# Patient Record
Sex: Male | Born: 1950 | Race: White | Hispanic: No | Marital: Single | Smoking: Former smoker
Health system: Southern US, Community
[De-identification: ages and names within clinical notes are randomized; demographics above are authoritative.]

## PROBLEM LIST (undated history)

## (undated) DIAGNOSIS — N189 Chronic kidney disease, unspecified: Secondary | ICD-10-CM

## (undated) DIAGNOSIS — F4000 Agoraphobia, unspecified: Secondary | ICD-10-CM

## (undated) DIAGNOSIS — M1990 Unspecified osteoarthritis, unspecified site: Secondary | ICD-10-CM

## (undated) DIAGNOSIS — J40 Bronchitis, not specified as acute or chronic: Secondary | ICD-10-CM

## (undated) DIAGNOSIS — G709 Myoneural disorder, unspecified: Secondary | ICD-10-CM

## (undated) DIAGNOSIS — D649 Anemia, unspecified: Secondary | ICD-10-CM

## (undated) DIAGNOSIS — K219 Gastro-esophageal reflux disease without esophagitis: Secondary | ICD-10-CM

## (undated) DIAGNOSIS — R7989 Other specified abnormal findings of blood chemistry: Secondary | ICD-10-CM

## (undated) DIAGNOSIS — L719 Rosacea, unspecified: Secondary | ICD-10-CM

## (undated) DIAGNOSIS — Z5189 Encounter for other specified aftercare: Secondary | ICD-10-CM

## (undated) DIAGNOSIS — T4145XA Adverse effect of unspecified anesthetic, initial encounter: Secondary | ICD-10-CM

## (undated) DIAGNOSIS — K2270 Barrett's esophagus without dysplasia: Secondary | ICD-10-CM

## (undated) DIAGNOSIS — F419 Anxiety disorder, unspecified: Secondary | ICD-10-CM

## (undated) DIAGNOSIS — F39 Unspecified mood [affective] disorder: Secondary | ICD-10-CM

## (undated) DIAGNOSIS — K5900 Constipation, unspecified: Secondary | ICD-10-CM

## (undated) DIAGNOSIS — E538 Deficiency of other specified B group vitamins: Secondary | ICD-10-CM

## (undated) DIAGNOSIS — M542 Cervicalgia: Secondary | ICD-10-CM

## (undated) DIAGNOSIS — K222 Esophageal obstruction: Secondary | ICD-10-CM

## (undated) DIAGNOSIS — J45909 Unspecified asthma, uncomplicated: Secondary | ICD-10-CM

## (undated) DIAGNOSIS — I10 Essential (primary) hypertension: Secondary | ICD-10-CM

## (undated) DIAGNOSIS — T8859XA Other complications of anesthesia, initial encounter: Secondary | ICD-10-CM

## (undated) DIAGNOSIS — G8929 Other chronic pain: Secondary | ICD-10-CM

## (undated) HISTORY — DX: Unspecified mood (affective) disorder: F39

## (undated) HISTORY — DX: Deficiency of other specified B group vitamins: E53.8

## (undated) HISTORY — DX: Essential (primary) hypertension: I10

## (undated) HISTORY — DX: Chronic kidney disease, unspecified: N18.9

## (undated) HISTORY — DX: Esophageal obstruction: K22.2

## (undated) HISTORY — DX: Other specified abnormal findings of blood chemistry: R79.89

## (undated) HISTORY — DX: Rosacea, unspecified: L71.9

## (undated) HISTORY — PX: CHEST TUBE INSERTION: SHX231

## (undated) HISTORY — PX: FRACTURE SURGERY: SHX138

## (undated) HISTORY — PX: WISDOM TOOTH EXTRACTION: SHX21

## (undated) HISTORY — PX: OTHER SURGICAL HISTORY: SHX169

## (undated) HISTORY — DX: Constipation, unspecified: K59.00

## (undated) HISTORY — DX: Barrett's esophagus without dysplasia: K22.70

## (undated) HISTORY — DX: Anxiety disorder, unspecified: F41.9

## (undated) HISTORY — DX: Agoraphobia, unspecified: F40.00

## (undated) HISTORY — DX: Myoneural disorder, unspecified: G70.9

## (undated) HISTORY — DX: Gastro-esophageal reflux disease without esophagitis: K21.9

## (undated) HISTORY — DX: Encounter for other specified aftercare: Z51.89

---

## 1999-03-01 ENCOUNTER — Encounter (INDEPENDENT_AMBULATORY_CARE_PROVIDER_SITE_OTHER): Payer: Self-pay | Admitting: *Deleted

## 1999-03-01 ENCOUNTER — Encounter: Admission: RE | Admit: 1999-03-01 | Discharge: 1999-03-01 | Payer: Self-pay | Admitting: *Deleted

## 2002-09-01 ENCOUNTER — Emergency Department (HOSPITAL_COMMUNITY): Admission: EM | Admit: 2002-09-01 | Discharge: 2002-09-01 | Payer: Self-pay | Admitting: Emergency Medicine

## 2003-08-20 ENCOUNTER — Encounter (INDEPENDENT_AMBULATORY_CARE_PROVIDER_SITE_OTHER): Payer: Self-pay | Admitting: *Deleted

## 2003-08-20 ENCOUNTER — Ambulatory Visit (HOSPITAL_COMMUNITY): Admission: RE | Admit: 2003-08-20 | Discharge: 2003-08-20 | Payer: Self-pay | Admitting: Internal Medicine

## 2003-08-20 ENCOUNTER — Encounter: Payer: Self-pay | Admitting: Internal Medicine

## 2003-08-20 HISTORY — PX: COLONOSCOPY: SHX174

## 2004-04-01 ENCOUNTER — Ambulatory Visit: Payer: Self-pay | Admitting: Family Medicine

## 2004-07-21 ENCOUNTER — Ambulatory Visit: Payer: Self-pay | Admitting: Family Medicine

## 2004-11-01 ENCOUNTER — Ambulatory Visit: Payer: Self-pay | Admitting: Internal Medicine

## 2004-11-22 ENCOUNTER — Ambulatory Visit: Payer: Self-pay | Admitting: Family Medicine

## 2005-03-17 ENCOUNTER — Ambulatory Visit: Payer: Self-pay | Admitting: Family Medicine

## 2005-07-14 ENCOUNTER — Emergency Department (HOSPITAL_COMMUNITY): Admission: EM | Admit: 2005-07-14 | Discharge: 2005-07-14 | Payer: Self-pay | Admitting: Emergency Medicine

## 2005-07-18 ENCOUNTER — Ambulatory Visit: Payer: Self-pay | Admitting: Family Medicine

## 2005-11-29 ENCOUNTER — Ambulatory Visit: Payer: Self-pay | Admitting: Family Medicine

## 2006-03-30 ENCOUNTER — Ambulatory Visit: Payer: Self-pay | Admitting: Family Medicine

## 2006-05-11 ENCOUNTER — Ambulatory Visit: Payer: Self-pay | Admitting: Family Medicine

## 2006-08-09 ENCOUNTER — Ambulatory Visit: Payer: Self-pay | Admitting: Internal Medicine

## 2006-08-10 ENCOUNTER — Ambulatory Visit: Payer: Self-pay | Admitting: Internal Medicine

## 2006-08-10 ENCOUNTER — Encounter: Payer: Self-pay | Admitting: Internal Medicine

## 2006-08-17 ENCOUNTER — Ambulatory Visit: Payer: Self-pay | Admitting: Family Medicine

## 2007-05-22 ENCOUNTER — Emergency Department (HOSPITAL_COMMUNITY): Admission: EM | Admit: 2007-05-22 | Discharge: 2007-05-22 | Payer: Self-pay | Admitting: Emergency Medicine

## 2007-05-26 ENCOUNTER — Emergency Department (HOSPITAL_COMMUNITY): Admission: EM | Admit: 2007-05-26 | Discharge: 2007-05-26 | Payer: Self-pay | Admitting: Emergency Medicine

## 2007-08-15 ENCOUNTER — Ambulatory Visit (HOSPITAL_COMMUNITY): Admission: RE | Admit: 2007-08-15 | Discharge: 2007-08-15 | Payer: Self-pay | Admitting: Family Medicine

## 2007-08-23 ENCOUNTER — Ambulatory Visit (HOSPITAL_COMMUNITY): Admission: RE | Admit: 2007-08-23 | Discharge: 2007-08-23 | Payer: Self-pay | Admitting: Family Medicine

## 2007-10-16 ENCOUNTER — Ambulatory Visit (HOSPITAL_COMMUNITY): Admission: RE | Admit: 2007-10-16 | Discharge: 2007-10-16 | Payer: Self-pay | Admitting: Orthopaedic Surgery

## 2008-01-08 ENCOUNTER — Encounter: Payer: Self-pay | Admitting: Internal Medicine

## 2008-04-01 ENCOUNTER — Encounter: Payer: Self-pay | Admitting: Internal Medicine

## 2008-07-25 ENCOUNTER — Encounter: Payer: Self-pay | Admitting: Internal Medicine

## 2008-08-05 ENCOUNTER — Telehealth: Payer: Self-pay | Admitting: Internal Medicine

## 2008-09-15 ENCOUNTER — Encounter: Payer: Self-pay | Admitting: Internal Medicine

## 2008-10-17 DIAGNOSIS — F411 Generalized anxiety disorder: Secondary | ICD-10-CM | POA: Insufficient documentation

## 2008-10-17 DIAGNOSIS — K5900 Constipation, unspecified: Secondary | ICD-10-CM | POA: Insufficient documentation

## 2008-10-17 DIAGNOSIS — K222 Esophageal obstruction: Secondary | ICD-10-CM | POA: Insufficient documentation

## 2008-10-17 DIAGNOSIS — I10 Essential (primary) hypertension: Secondary | ICD-10-CM | POA: Insufficient documentation

## 2008-10-17 DIAGNOSIS — K2270 Barrett's esophagus without dysplasia: Secondary | ICD-10-CM | POA: Insufficient documentation

## 2008-10-17 DIAGNOSIS — K219 Gastro-esophageal reflux disease without esophagitis: Secondary | ICD-10-CM | POA: Insufficient documentation

## 2008-10-22 ENCOUNTER — Ambulatory Visit: Payer: Self-pay | Admitting: Internal Medicine

## 2008-10-23 LAB — CONVERTED CEMR LAB
ALT: 29 units/L (ref 0–53)
AST: 27 units/L (ref 0–37)
Albumin: 4.3 g/dL (ref 3.5–5.2)
Alkaline Phosphatase: 110 units/L (ref 39–117)
Bilirubin, Direct: 0.1 mg/dL (ref 0.0–0.3)
Total Bilirubin: 0.9 mg/dL (ref 0.3–1.2)
Total Protein: 7.7 g/dL (ref 6.0–8.3)

## 2008-10-31 ENCOUNTER — Ambulatory Visit: Payer: Self-pay | Admitting: Internal Medicine

## 2008-10-31 ENCOUNTER — Encounter: Payer: Self-pay | Admitting: Internal Medicine

## 2008-10-31 ENCOUNTER — Ambulatory Visit (HOSPITAL_COMMUNITY): Admission: RE | Admit: 2008-10-31 | Discharge: 2008-10-31 | Payer: Self-pay | Admitting: Internal Medicine

## 2008-11-03 ENCOUNTER — Encounter: Payer: Self-pay | Admitting: Internal Medicine

## 2008-11-10 ENCOUNTER — Telehealth: Payer: Self-pay | Admitting: Internal Medicine

## 2009-05-15 ENCOUNTER — Encounter: Payer: Self-pay | Admitting: Internal Medicine

## 2010-04-19 ENCOUNTER — Telehealth: Payer: Self-pay | Admitting: Internal Medicine

## 2010-04-27 NOTE — Procedures (Signed)
Summary: EGD   EGD  Procedure date:  08/10/2006  Findings:      Location: [facility]   Patient Name: [name], [name]. MRN:  Procedure Procedures: Panendoscopy (EGD) CPT: 43235.    with biopsy(s)/brushing(s). CPT: D1846139.  Personnel: Endoscopist: Zacharias Ridling L. Juanda Chance, MD.  Exam Location: Exam performed in Outpatient Clinic. Outpatient  Patient Consent: Procedure, Alternatives, Risks and Benefits discussed, consent obtained, from patient. Consent was obtained by the RN.  Indications Symptoms: Reflux symptoms  Surveillance of: Barrett's Esophagus. Last exam: May, 2005.  History  Current Medications: Patient is not currently taking Coumadin.  Pre-Exam Physical: Performed Aug 10, 2006  Entire physical exam was normal.  Comments: Pt. history reviewed/updated, physical exam performed prior to initiation of sedation?yes Exam Exam Info: Maximum depth of insertion Duodenum, intended Duodenum. Vocal cords visualized. Gastric retroflexion performed. Images taken. ASA Classification: I. Tolerance: good.  Sedation Meds: Patient assessed and found to be appropriate for moderate (conscious) sedation. Fentanyl 50 mcg. given IV. Versed 5 mg. given IV. Cetacaine Spray 2 sprays given aerosolized.  Monitoring: BP and pulse monitoring done. Oximetry used. Supplemental O2 given  Findings - Normal: Fundus.  - BARRETT'S ESOPHAGUS:  established. Length of Barrett's 1 cm. Inflammation present. A retroflex image was taken. Biopsy/Barrett's taken. ICD9: Barrett's: 530.2. Comment: mild esophagitis.  Normal: Duodenal Apex.   Assessment Abnormal examination, see findings above.  Diagnoses: 530.2: Barrett's.   Comments: Barrett's esophagus, with mild esophagitis Events  Unplanned Intervention: No unplanned interventions were required.  Unplanned Events: There were no complications. Plans Medication(s): Await pathology. PPI: 1 BID, starting Aug 10, 2006   Comments:  repeat colonoscopy in 2 years Disposition: After procedure patient sent to recovery. After recovery patient sent home.    FINAL DIAGNOSIS    ***MICROSCOPIC EXAMINATION AND DIAGNOSIS***    ESOPHAGUS, BIOPSY: REFLUX ESOPHAGITIS WITH FOCAL EROSION. SEE   COMMENT.    COMMENT   Sections reveal benign squamous and gastric-type mucosa showing   inflammation with reactive changes and focal features of erosion.   Although marked reactive changes are seen in the area of   erosion, definite evidence of dysplasia is not identified and no   malignancy is seen. Although the intestinal metaplasia   identified on previous biopsy ([id number]) is not identified in   this specimen, this difference may be due to sampling variation   and clinical correlation is suggested. An Alcian Blue stain is   performed to determine the presence of intestinal metaplasia   (goblet cell metaplasia). No intestinal metaplasia (goblet cell   metaplasia) is identified with the Alcian Blue stain. The control   stained appropriately. (RC:gt, 08/14/06)    gdt   Date Reported: 08/14/2006 Clearance Coots, MD   *** Electronically Signed Out By [facility] ***    Clinical information   R/O dysplasia. History of Barrett' s. (mj)    specimen(s) obtained   Esophagus, biopsy    Gross Description   Received in formalin are tan, soft tissue fragments that are   submitted in toto. Number: six   Size: 0.1 to 0.2 cm, one block. (TB:gt, 08/11/06)    gdt/   Additional Information  HL7 RESULT STATUS : 2006-08-11:14:27:00.000000  This report was created from the original endoscopy report, which was reviewed and signed by the above listed endoscopist.

## 2010-04-27 NOTE — Letter (Signed)
Summary: Patient Notice-Barrett's [facility]  [address] [place], [place] [address]   Phone: [phone]  Fax: [phone]        November 03, 2008 MRN: [id number]    Matthew [name] [address] [place], [place]  [address]    Dear [name],  I am pleased to inform you that the biopsies taken during your recent endoscopic examination did not show any evidence of cancer upon pathologic examination.  However, your biopsies indicate you have a condition known as Barrett's esophagus. While not cancer, it is pre-cancerous (can progress to cancer) and needs to be monitored with repeat endoscopic examination and biopsies.  Fortunately, it is quite rare that this develops into cancer, but careful monitoring of the condition along with taking your medication as prescribed is important in reducing the risk of developing cancer.  It is my recommendation that you have a repeat upper gastrointestinal endoscopic examination in 2_ years.  Additional information/recommendations:  __Please call [phone] to schedule a return visit to further      evaluate your condition.  __Continue with treatment plan as outlined the day of your exam.  Please call us if you have or develop heartburn, reflux symptoms, any swallowing problems, or if you have questions about your condition that have not been fully answered at this time.  Sincerely,  Hart Carwin MD  This letter has been electronically signed by your physician.  Appended Document: Patient Notice-Barrett's Esopghagus Letter mailed to patient. Recall is in IDX for 10/2010.

## 2010-04-27 NOTE — Procedures (Signed)
Summary: EGD/BX.   EGD  Procedure date:  10/31/2008  Findings:      Location: [facility]   ENDOSCOPY PROCEDURE REPORT  PATIENT:  Matthew [name], Matthew [name]  MR#:  [id number] BIRTHDATE:   05/13/1950, 58 yrs. old   GENDER:   male  ENDOSCOPIST:   Hedwig Morton. Juanda Chance, MD Referred by: Joette Catching, M.D.  PROCEDURE DATE:  10/31/2008 PROCEDURE:  EGD with biopsy ASA CLASS:   Class I INDICATIONS: h/o Barrett's Esophagus last EGD 2008, c/w Barretts esophagus, Sx's controlled with Nexiem  MEDICATIONS:    Versed 7.5 mg, Fentanyl 75 mcg TOPICAL ANESTHETIC:   Cetacaine Spray  DESCRIPTION OF PROCEDURE:   After the risks benefits and alternatives of the procedure were thoroughly explained, informed consent was obtained.  The [id number] ([id number]) endoscope was introduced through the mouth and advanced to the second portion of the duodenum, without limitations.  The instrument was slowly withdrawn as the mucosa was fully examined. <<PROCEDUREIMAGES>>        <<OLD IMAGES>>  Barrett's esophagus was found. short segment Barrett's esophagus, no stricture, no acute esophagitis With standard forceps, a biopsy was obtained and sent to pathology (see image001).  Retained food was present in the total stomach (see image002 and image005).  Otherwise the examination was normal (see image004).    Retroflexed views revealed no abnormalities.    The scope was then withdrawn from the patient and the procedure completed.  COMPLICATIONS:   None  ENDOSCOPIC IMPRESSION:  1) Barrett's esophagus  2) Food, retained in the total stomach  3) Otherwise normal examination  s/p biopsies RECOMMENDATIONS:  1) anti-reflux regimen  2) await biopsy results  Nexiem 40 mg po qd  REPEAT EXAM:   In 2 year(s) for.   _______________________________ Hedwig Morton. Juanda Chance, MD    CC:     Appended Document: EGD/BX. Recall is in IDX for 10/2010.

## 2010-04-27 NOTE — Miscellaneous (Signed)
Summary: prevacid refill  Clinical Lists Changes  Medications: Changed medication from PREVACID 30 MG CPDR (LANSOPRAZOLE) Take 1 capsule by mouth every morning to PREVACID 30 MG CPDR (LANSOPRAZOLE) Take 1 tablet by mouth two times a day. NEED OFFICE VISIT FOR FURTHER REFILLS! - Signed Rx of PREVACID 30 MG CPDR (LANSOPRAZOLE) Take 1 tablet by mouth two times a day. NEED OFFICE VISIT FOR FURTHER REFILLS!;  #60 x 5;  Signed;  Entered by: Hortense Ramal CMA;  Authorized by: Hart Carwin MD;  Method used: Print then Give to Patient    Prescriptions: PREVACID 30 MG CPDR (LANSOPRAZOLE) Take 1 tablet by mouth two times a day. NEED OFFICE VISIT FOR FURTHER REFILLS!  #60 x 5   Entered by:   Hortense Ramal CMA   Authorized by:   Hart Carwin MD   Signed by:   Hortense Ramal CMA on 01/08/2008   Method used:   Print then Give to Patient   RxID:   [id number]

## 2010-04-27 NOTE — Progress Notes (Signed)
Summary: TRIAGE--? MEDS  Phone Note Call from Patient Call back at Home Phone [phone]   Caller: Patient Call For: Tiffay Pinette Reason for Call: Talk to Nurse Details for Reason: ? meds  Summary of Call: pt is concerned about side effects on Nexium . Initial call taken by: Guadlupe [facility],  Aug 05, 2008 11:46 AM  Follow-up for Phone Call        Pt. saw a news show last night and is concerned about taking Nexium long term. Pt. takes Nexium two times a day, "I been taking  it for a long time." States the news show said he should be worried about kidney disease, intestional infectional and pneumonia. States his creatinin was checked in February by Dr.Dunham, "She said I would be fine"  Pt. has no c/o.  DR.Dhara Schepp PLEASE ADVISE  Follow-up by: Laureen Ochs LPN,  Aug 05, 2008 12:01 PM  Additional Follow-up for Phone Call Additional follow up Details #1::        OK to take Nexiem long term because he needs it  to control his acid. Additional Follow-up by: Hart Carwin,  Aug 05, 2008 10:45 PM    Additional Follow-up for Phone Call Additional follow up Details #2::    Above MD orders reviewed with patient. Pt. instructed to call back as needed.   Follow-up by: Laureen Ochs LPN,  Aug 06, 2008 8:30 AM

## 2010-04-27 NOTE — Miscellaneous (Signed)
Summary: Nexium Rx  Clinical Lists Changes  Medications: Rx of NEXIUM 40 MG CPDR (ESOMEPRAZOLE MAGNESIUM) Take 1 tablet by mouth two times a day. MUST HAVE OFFICE VISIT FOR FURTHER REFILLS!;  #30 x 0;  Signed;  Entered by: Hortense Ramal CMA;  Authorized by: Hart Carwin;  Method used: Faxed to [facility], [phone] [address] [place], [place], [place]  [address], Ph: [phone] or [phone], Fax: [phone]    Prescriptions: NEXIUM 40 MG CPDR (ESOMEPRAZOLE MAGNESIUM) Take 1 tablet by mouth two times a day. MUST HAVE OFFICE VISIT FOR FURTHER REFILLS!  #30 x 0   Entered by:   Hortense Ramal CMA   Authorized by:   Hart Carwin   Signed by:   Hortense Ramal CMA on 09/15/2008   Method used:   Faxed to ...       Hospital doctor (retail)       [address]       [place], [place]  [address]       Ph: [phone] or [phone]       Fax: [phone]   RxID:   [id number]

## 2010-04-27 NOTE — Miscellaneous (Signed)
Summary: rx change to nexium  Patient requested a change to nexium due to his insurance formulary. Hortense Ramal CMA  April 01, 2008 8:59 AM    Clinical Lists Changes  Medications: Changed medication from PREVACID 30 MG CPDR (LANSOPRAZOLE) Take 1 tablet by mouth two times a day. NEED OFFICE VISIT FOR FURTHER REFILLS! to NEXIUM 40 MG CPDR (ESOMEPRAZOLE MAGNESIUM) Take 1 tablet by mouth two times a day. (pharmacy-please d/c prevacid rx) - Signed Rx of NEXIUM 40 MG CPDR (ESOMEPRAZOLE MAGNESIUM) Take 1 tablet by mouth two times a day. (pharmacy-please d/c prevacid rx);  #60 x 3;  Signed;  Entered by: Hortense Ramal CMA;  Authorized by: Hart Carwin MD;  Method used: Printed then faxed to [facility], [phone] [address], [place], [place], [place]  [address], Ph: [phone] or [phone], Fax: [phone]    Prescriptions: NEXIUM 40 MG CPDR (ESOMEPRAZOLE MAGNESIUM) Take 1 tablet by mouth two times a day. (pharmacy-please d/c prevacid rx)  #60 x 3   Entered by:   Hortense Ramal CMA   Authorized by:   Hart Carwin MD   Signed by:   Hortense Ramal CMA on 04/01/2008   Method used:   Printed then faxed to ...       Hospital doctor (retail)       [address]       [place], [place]  [address]       Ph: [phone] or [phone]       Fax: [phone]   RxID:   [id number]

## 2010-04-27 NOTE — Miscellaneous (Signed)
Summary: Nexium Rx  Clinical Lists Changes  Medications: Changed medication from NEXIUM 40 MG CPDR (ESOMEPRAZOLE MAGNESIUM) Take 1 tablet by mouth two times a day. (pharmacy-please d/c prevacid rx) to NEXIUM 40 MG CPDR (ESOMEPRAZOLE MAGNESIUM) Take 1 tablet by mouth two times a day. MUST HAVE OFFICE VISIT FOR FURTHER REFILLS! - Signed Rx of NEXIUM 40 MG CPDR (ESOMEPRAZOLE MAGNESIUM) Take 1 tablet by mouth two times a day. MUST HAVE OFFICE VISIT FOR FURTHER REFILLS!;  #60 x 1;  Signed;  Entered by: Hortense Ramal CMA;  Authorized by: Hart Carwin;  Method used: Faxed to [facility], [phone] [address] [place], [place], [place]  [address], Ph: [phone] or [phone], Fax: [phone]    Prescriptions: NEXIUM 40 MG CPDR (ESOMEPRAZOLE MAGNESIUM) Take 1 tablet by mouth two times a day. MUST HAVE OFFICE VISIT FOR FURTHER REFILLS!  #60 x 1   Entered by:   Hortense Ramal CMA   Authorized by:   Hart Carwin   Signed by:   Hortense Ramal CMA on 07/25/2008   Method used:   Faxed to ...       Hospital doctor (retail)       [address]       [place], [place]  [address]       Ph: [phone] or [phone]       Fax: [phone]   RxID:   [id number]

## 2010-04-27 NOTE — Procedures (Signed)
Summary: Gastroenterology-[facility]  Gastroenterology-[facility]   Imported By: Lowry Ram NCMA 10/17/2008 17:31:59  _____________________________________________________________________  External Attachment:    Type:   Image     Comment:   External Document

## 2010-04-27 NOTE — Miscellaneous (Signed)
Summary: Nexium Refills  Clinical Lists Changes  Medications: Rx of NEXIUM 40 MG CPDR (ESOMEPRAZOLE MAGNESIUM) Take 1 tablet by mouth two times a day.;  #60 x 5;  Signed;  Entered by: Hortense Ramal CMA (AAMA);  Authorized by: Hart Carwin MD;  Method used: Faxed to [facility], [phone] [address] [place], [place], [place]  [address], Ph: [phone] or [phone], Fax: [phone]    Prescriptions: NEXIUM 40 MG CPDR (ESOMEPRAZOLE MAGNESIUM) Take 1 tablet by mouth two times a day.  #60 x 5   Entered by:   Hortense Ramal CMA (AAMA)   Authorized by:   Hart Carwin MD   Signed by:   Hortense Ramal CMA (AAMA) on 05/15/2009   Method used:   Faxed to ...       Hospital doctor (retail)       [address]       [place], [place]  [address]       Ph: [phone] or [phone]       Fax: [phone]   RxID:   [id number]

## 2010-04-27 NOTE — Progress Notes (Signed)
Summary: Nexium  Phone Note Call from Patient   Call For: DR Cerria Randhawa Reason for Call: Talk to Nurse Summary of Call: Is he still supposed to be on Nexium 40mg ? Initial call taken by: Leanor Kail [facility],  November 10, 2008 10:46 AM  Follow-up for Phone Call        Yes he is to continue on it most likely indefinately. He has Barrett's esophagus. Patient was advised of this and it was explained to him that we want him to continue the medication to suppress the acid from refluxing back into his esophagus. We dont want any further progression of the Barrett's. Patient verbalizes understanding. Follow-up by: Hortense Ramal CMA Duncan Dull),  November 10, 2008 10:54 AM

## 2010-04-27 NOTE — Assessment & Plan Note (Signed)
Summary: F-UP NEXIUM/FH   History of Present Illness Visit Type: follow up Primary GI MD: Lina Sar MD Primary Provider: Joette Catching, MD Requesting Provider: n/a Chief Complaint: Pt needs Nexium refilled. Pt denies any GI complaints  History of Present Illness:   Mr. Matthew [name] is a 60 year old gentleman with a history of functional constipation and esophageal stricture for which he is status post esophageal dilatation in 1996 and again in 2005. On the 2005 endoscopy, esophageal biopsies confirmed the presence of Barrett's esophagus. The most recent endoscopy in 2008 showed  reflux esophagitis with focal erosion. but no Barrett'. However, no intestinal metaplasia was seen on those biopsies. His recall colonoscopy was due in May 2015. He had a routine colonoscopy done in May 2005, which was normal. There were no polyps. He is due for another colonoscopy in 2015. Patient comes today for a routine visit and for refills on his Nexium. He has no gastrointestinal complaints at this time.    GI Review of Systems      Denies abdominal pain, acid reflux, belching, bloating, chest pain, dysphagia with liquids, dysphagia with solids, heartburn, loss of appetite, nausea, vomiting, vomiting blood, weight loss, and  weight gain.        Denies anal fissure, black tarry stools, change in bowel habit, constipation, diarrhea, diverticulosis, fecal incontinence, heme positive stool, hemorrhoids, irritable bowel syndrome, jaundice, light color stool, liver problems, rectal bleeding, and  rectal pain.    Current Medications (verified): 1)  Nexium 40 Mg Cpdr (Esomeprazole Magnesium) .... Take 1 Tablet By Mouth Two Times A Day. Must Have Office Visit For Further Refills! 2)  Tegretol Xr 100 Mg Xr12h-Tab (Carbamazepine) .... One Tablet By Mouth Four Times A Day 3)  Norvasc 10 Mg Tabs (Amlodipine Besylate) .... One Tablet By Mouth Once Daily 4)  Ativan 1 Mg Tabs (Lorazepam) .... One Tablet By Mouth Four Times A Day  5)  Diovan 80 Mg Tabs (Valsartan) .... One Tablet By Mouth Once Daily 6)  Aspirin 81 Mg  Tabs (Aspirin) .... One Tablet By Mouth Once Daily 7)  Trihexyphenidyl Hcl 2 Mg Tabs (Trihexyphenidyl Hcl) .... One Tablet Two Times A Day 8)  Chlorpromazine Hcl 100 Mg Tabs (Chlorpromazine Hcl) .... One Tablet By Mouth Four Times A Day  Allergies (verified): 1)  ! Pcn 2)  Tylenol (Acetaminophen) 3)  Amoxicillin (Amoxicillin)  Past History:  Past Medical History: Reviewed history from 10/17/2008 and no changes required. Current Problems:  ANXIETY (ICD-300.00) HYPERTENSION (ICD-401.9) Hx of CONSTIPATION (ICD-564.00) GERD (ICD-530.81) BARRETTS ESOPHAGUS (ICD-530.85) Hx of ESOPHAGEAL STRICTURE (ICD-530.3)    Past Surgical History: Reviewed history from 10/17/2008 and no changes required. unremarkable  Family History: Reviewed history from 10/17/2008 and no changes required. No FH of Colon Cancer:  Social History: Reviewed history from 10/17/2008 and no changes required. Occupation: Disabled Alcohol Use - no Illicit Drug Use - no  Review of Systems  The patient denies allergy/sinus, anemia, anxiety-new, arthritis/joint pain, back pain, blood in urine, breast changes/lumps, change in vision, confusion, cough, coughing up blood, depression-new, fainting, fatigue, fever, headaches-new, hearing problems, heart murmur, heart rhythm changes, itching, muscle pains/cramps, night sweats, nosebleeds, shortness of breath, skin rash, sleeping problems, sore throat, swelling of feet/legs, swollen lymph glands, thirst - excessive, urination - excessive, urination changes/pain, urine leakage, vision changes, and voice change.         Pertinent positive and negative review of systems were noted in the above HPI. All other ROS was otherwise negative.   Vital Signs:  Patient profile:   60 year old male Height:      76 inches Weight:      202 pounds BMI:     24.68 BSA:     2.23 Pulse rate:   88 /  minute Pulse rhythm:   regular BP sitting:   132 / 76  (left arm) Cuff size:   regular  Vitals Entered By: Ok Anis CMA (October 22, 2008 11:23 AM)  Physical Exam  General:  alert, oriented and in no distress. He speaks somewhat slowly. Eyes:  PERRLA, no icterus. Mouth:  No deformity or lesions, dentition normal. Lungs:  Clear throughout to auscultation. Heart:  Regular rate and rhythm; no murmurs, rubs,  or bruits. Abdomen:  Soft, nontender and nondistended. No masses, hepatosplenomegaly or hernias noted. Normal bowel sounds. Psych:  flat affect, alert and oriented.   Impression & Recommendations:  Problem # 1:  Hx of CONSTIPATION (ICD-564.00) Patient takes Benefiber p.r.n. His last colonoscopy was in 2005. His recall colonoscopy will be due in 2015.  Problem # 2:  BARRETTS ESOPHAGUS (ICD-530.85) Barrett's was confirmed on  biopsies from 2005. There was no Barrett's seen on an endoscopy  in 2008. He is due for a repeat endoscopy and repeat biopsies. We will also refill his Nexium 40 mg twice a day. Orders: TLB-Hepatic/Liver Function Pnl ([id number]-HEPATIC) ZEGD (ZEGD)  Patient Instructions: 1)  Schedule Endoscopy with biopsies 2)  LFT's to be drawn today. 3)  Refills for Nexium. 4)  Recall colonoscopy 07/2013. 5)  Copy sent to : Joette Catching, MD Prescriptions: NEXIUM 40 MG CPDR (ESOMEPRAZOLE MAGNESIUM) Take 1 tablet by mouth two times a day.  #60 x 5   Entered by:   Hortense Ramal CMA (AAMA)   Authorized by:   Hart Carwin MD   Signed by:   Hortense Ramal CMA (AAMA) on 10/22/2008   Method used:   Faxed to ...       Hospital doctor (retail)       [address] [place] [address]       [place], [place]  [address]       Ph: [phone] or [phone]       Fax: [phone]   RxID:   [id number]

## 2010-04-27 NOTE — Procedures (Signed)
Summary: Instructions for procedure/[facility] (out pt)  Instructions for procedure/[facility] (out pt)   Imported By: Sherian Rein 10/23/2008 11:24:28  _____________________________________________________________________  External Attachment:    Type:   Image     Comment:   External Document

## 2010-04-29 NOTE — Progress Notes (Signed)
Summary: Medication  Phone Note Call from Patient Call back at Home Phone [phone]   Caller: Patient Call For: Dr. Juanda Chance Reason for Call: Talk to Nurse Summary of Call: [name] he was returning your call....Marland KitchenMarland KitchenMedco will not longer cover his Nexium...wants to know if Dr. Juanda Chance can send a letter Initial call taken by: Karna Christmas,  April 19, 2010 8:35 AM  Follow-up for Phone Call        Patient advised that we will need a rejected claim (he is not due for more Nexium until 05/08/10). Patient advised that he should request refill when it gets closer to that time and if there is a coverage problem, they will let us know and we can get prior authorization when it is due. Patient verbalizes understanding. Follow-up by: Lamona Curl CMA (AAMA),  April 19, 2010 9:10 AM

## 2010-05-31 ENCOUNTER — Encounter: Payer: Self-pay | Admitting: Internal Medicine

## 2010-06-08 NOTE — Medication Information (Signed)
Summary: Prior autho & approved for Nexium/[employer]  Prior autho & approved for Nexium/[employer]   Imported By: Sherian Rein 06/03/2010 11:05:04  _____________________________________________________________________  External Attachment:    Type:   Image     Comment:   External Document

## 2010-06-28 ENCOUNTER — Other Ambulatory Visit: Payer: Self-pay | Admitting: *Deleted

## 2010-06-28 MED ORDER — ESOMEPRAZOLE MAGNESIUM 40 MG PO CPDR
DELAYED_RELEASE_CAPSULE | ORAL | Status: DC
Start: 2010-06-28 — End: 2010-08-31

## 2010-06-28 NOTE — Telephone Encounter (Signed)
We have received a fax from [facility] for refills on Nexium for patient. We will give 1 month supply with 1 refill but patient needs office visit. It has been almost 1 years since we last saw him and he has Barrett's Esophagus.

## 2010-08-05 ENCOUNTER — Other Ambulatory Visit (HOSPITAL_COMMUNITY): Payer: Self-pay | Admitting: Nephrology

## 2010-08-05 DIAGNOSIS — D369 Benign neoplasm, unspecified site: Secondary | ICD-10-CM

## 2010-08-10 NOTE — Assessment & Plan Note (Signed)
[facility]                         GASTROENTEROLOGY OFFICE NOTE   MAXDEN, NAJI                       MRN:          [id number]  DATE:08/09/2006                            DOB:          1950/08/02    Mr. Licciardi is a 60 year old gentleman with a history of functional  constipation, esophageal stricture status post esophageal dilatation in  1996 and most recently in May of 2005.  On the last endoscopy,  esophageal biopsies confirmed presence of Barrett's esophagus.  He was  due for an endoscopy last year and was supposed to get a recall later,  but after reviewing his chart, I do not see where he was ever notified  to come back for repeat endoscopy.  He had a routine colonoscopy May  2005, which was normal.  There were no polyps, and he is due for another  1 in 10 years.  He called our office several weeks ago complaining of  constipation.  We have started him on MiraLax 17 g daily.  He has been  happy with this preparation having bowel movements on a daily basis.  He  denies having diarrhea.  Prior to [facility], he was on Benefiber 2  teaspoons 3 times a day.   PHYSICAL EXAM:  Blood pressure 122/72, pulse 68, and weight 208 pounds.  The patient was alert and oriented.  He was not examined.   IMPRESSION:  86. A 60 -year-old gentleman with functional constipation.  He is up to      date on his colonoscopy.  2. Barrett's esophagus needs followup endoscopy and biopsies.   PLAN:  1. Continue MiraLax on a p.r.n. basis.  2. Resume Benefiber 2 teaspoons twice a day.  3. Upper endoscopy scheduled with possible plans for biopsies.  The      patient ought to continue on his proton pump inhibitor.  He is on      Prevacid 30 mg daily.     Hedwig Morton. Juanda Chance, MD     DMB/MedQ  DD: 08/09/2006  DT: 08/09/2006  Job #: [id number]   cc:   Delaney Meigs, M.D.

## 2010-08-12 ENCOUNTER — Ambulatory Visit (HOSPITAL_COMMUNITY): Payer: Self-pay

## 2010-08-12 ENCOUNTER — Encounter (HOSPITAL_COMMUNITY)
Admission: RE | Admit: 2010-08-12 | Discharge: 2010-08-12 | Disposition: A | Discharge status: Home / Self Care | Payer: Medicare Other | Source: Ambulatory Visit | Attending: Nephrology | Admitting: Nephrology

## 2010-08-12 DIAGNOSIS — E213 Hyperparathyroidism, unspecified: Secondary | ICD-10-CM | POA: Insufficient documentation

## 2010-08-12 DIAGNOSIS — D369 Benign neoplasm, unspecified site: Secondary | ICD-10-CM

## 2010-08-12 MED ORDER — TECHNETIUM TC 99M SESTAMIBI - CARDIOLITE
25.0000 | Freq: Once | INTRAVENOUS | Status: AC | PRN
Start: 2010-08-12 — End: 2010-08-12
  Administered 2010-08-12: 25 via INTRAVENOUS

## 2010-08-27 ENCOUNTER — Other Ambulatory Visit: Payer: Self-pay | Admitting: *Deleted

## 2010-08-27 NOTE — Telephone Encounter (Signed)
Rx denied. Patient needs office visit. He has Barrett's and last endoscopy was in 2010. We asked that an office visit be scheduled at his last refill and there is currently no office visit scheduled.

## 2010-08-31 ENCOUNTER — Other Ambulatory Visit: Payer: Self-pay | Admitting: Internal Medicine

## 2010-08-31 MED ORDER — ESOMEPRAZOLE MAGNESIUM 40 MG PO CPDR: DELAYED_RELEASE_CAPSULE | ORAL | Status: DC

## 2010-08-31 NOTE — Telephone Encounter (Signed)
Patient must keep appointment for further refills.

## 2010-10-05 ENCOUNTER — Ambulatory Visit (INDEPENDENT_AMBULATORY_CARE_PROVIDER_SITE_OTHER): Payer: Medicare Other | Admitting: Internal Medicine

## 2010-10-05 ENCOUNTER — Encounter: Payer: Self-pay | Admitting: Internal Medicine

## 2010-10-05 VITALS — BP 110/62 | HR 80 | Ht 76.0 in | Wt 193.4 lb

## 2010-10-05 DIAGNOSIS — K2270 Barrett's esophagus without dysplasia: Secondary | ICD-10-CM

## 2010-10-05 MED ORDER — ESOMEPRAZOLE MAGNESIUM 40 MG PO CPDR: 40.0000 mg | DELAYED_RELEASE_CAPSULE | Freq: Two times a day (BID) | ORAL | Status: DC

## 2010-10-05 NOTE — Progress Notes (Signed)
Matthew [name] 01-10-1951 MRN [id number]     History of Present Illness:  This is a 60 year old white male with Barrett's esophagus confirmed in 1996 and in 2005. His last upper endoscopy in August 2008 showed intestinal metaplasia and goblet cells consistent with Barrett's esophagus. His symptoms are well-controlled on Nexium 40 mg by mouth twice a day. She has tried to reduce her Nexium to one a day but had breakthrough symptoms. He is due for a repeat upper endoscopy. Additional medical problems include chronic renal insufficiency, B12 deficiency and high blood pressure. His last colonoscopy in May 2005 was normal. A recall colonoscopy will be due in May 2015.  Past Medical History  Diagnosis Date  . Anxiety   . Hypertension   . GERD (gastroesophageal reflux disease)   . Barrett esophagus   . Esophageal stricture   . Vitamin B12 deficiency   . Mood disorder   . Agoraphobia    History reviewed. No pertinent past surgical history.  reports that he has quit smoking. He does not have any smokeless tobacco history on file. He reports that he does not drink alcohol or use illicit drugs. family history includes Diabetes in his brother.  There is no history of Colon cancer. Allergies  Allergen Reactions  . Acetaminophen     REACTION: unspecified  . Amoxicillin     REACTION: unspecified  . Penicillins         Review of Systems:  The remainder of the 10  point ROS is negative except as outlined in H&P   Physical Exam: General appearance  Well developed, in no distress. Eyes- non icteric. HEENT nontraumatic, normocephalic. Mouth no lesions, tongue papillated, no cheilosis. Neck supple without adenopathy, thyroid not enlarged, no carotid bruits, no JVD. Lungs Clear to auscultation bilaterally. Cor normal S1 normal S2, regular rhythm , no murmur,  quiet precordium. Abdomen nontender, normoactive bowel sounds. Rectal: Not done. Extremities no pedal edema. Skin no  lesions. Neurological alert and oriented x 3. Psychological normal mood and affect.  Assessment and Plan:  Problem #1 Barrett's esophagus. Asymptomatic. Patient is due for a repeat upper endoscopy in August 2012. We will schedule that today. We will refill his Nexium 40 mg by mouth twice a day, He is to continue antireflux measures. Problem #2  colorectal screening. Patient is due for a repeat colonoscopy in May 2015.      10/05/2010 Lina Sar

## 2010-10-05 NOTE — Patient Instructions (Addendum)
You have been scheduled for an endoscopy. Please follow written instructions given to you at your visit today. We have sent the following medications to your pharmacy for you to pick up at your convenience: Nexium 40 mg. Take 1 tablet by mouth twice daily CC: Dr Lysbeth Galas

## 2010-10-14 ENCOUNTER — Telehealth: Payer: Self-pay | Admitting: Internal Medicine

## 2010-10-14 NOTE — Telephone Encounter (Signed)
Patient calling to report constipation. States he feels full and has no appetite. He took one dose of Miralax last night and had a small bowel movement that was "hard balls". Patient will repeat a Miralax dose this AM and increase his fluid intake. He will call back if not successful with Miralax.

## 2010-11-04 ENCOUNTER — Telehealth: Payer: Self-pay | Admitting: Internal Medicine

## 2010-11-04 NOTE — Telephone Encounter (Signed)
Patient calling because his green sheet said not to take laxatives after the procedure but to let the body return to normal. He is confused about this since he is having an EGD. Spoke with patient and told him he could take the Miralax without any problem. He is asking about taking Benefiber. He will discuss this with Dr. Juanda Chance on Tues when he has his procedure.

## 2010-11-08 ENCOUNTER — Telehealth: Payer: Self-pay | Admitting: Internal Medicine

## 2010-11-08 NOTE — Telephone Encounter (Signed)
Yes, charge late canc fee, he knew about his mother having surgery , I am sure, for a while. Please take him out of the schedule so we can use his spot. Thanx DB

## 2010-11-09 ENCOUNTER — Other Ambulatory Visit: Payer: Medicare Other | Admitting: Internal Medicine

## 2010-12-17 LAB — URINALYSIS, ROUTINE W REFLEX MICROSCOPIC
Bilirubin Urine: NEGATIVE
Glucose, UA: NEGATIVE
Hgb urine dipstick: NEGATIVE
Ketones, ur: NEGATIVE
Nitrite: NEGATIVE
Protein, ur: NEGATIVE
Specific Gravity, Urine: 1.007
Urobilinogen, UA: 0.2
pH: 5.5

## 2010-12-17 LAB — I-STAT 8, (EC8 V) (CONVERTED LAB)
Acid-base deficit: 4 — ABNORMAL HIGH
BUN: 19
Bicarbonate: 20.9
Chloride: 103
Glucose, Bld: 128 — ABNORMAL HIGH
HCT: 36 — ABNORMAL LOW
Hemoglobin: 12.2 — ABNORMAL LOW
Operator id: 277751
Potassium: 3.9
Sodium: 134 — ABNORMAL LOW
TCO2: 22
pCO2, Ven: 37.2 — ABNORMAL LOW
pH, Ven: 7.357 — ABNORMAL HIGH

## 2010-12-17 LAB — RAPID URINE DRUG SCREEN, HOSP PERFORMED
Amphetamines: NOT DETECTED
Barbiturates: NOT DETECTED
Benzodiazepines: POSITIVE — AB
Cocaine: NOT DETECTED
Opiates: NOT DETECTED
Tetrahydrocannabinol: NOT DETECTED

## 2010-12-17 LAB — CBC
HCT: 33.7 — ABNORMAL LOW
Hemoglobin: 11.9 — ABNORMAL LOW
MCHC: 35.3
MCV: 85.8
Platelets: 137 — ABNORMAL LOW
RBC: 3.92 — ABNORMAL LOW
RDW: 12.9
WBC: 6.2

## 2010-12-17 LAB — POCT I-STAT CREATININE
Creatinine, Ser: 2.2 — ABNORMAL HIGH
Operator id: 277751

## 2010-12-17 LAB — CARBAMAZEPINE LEVEL, TOTAL: Carbamazepine Lvl: 8.7

## 2010-12-21 ENCOUNTER — Encounter: Payer: Self-pay | Admitting: Internal Medicine

## 2010-12-21 ENCOUNTER — Ambulatory Visit (AMBULATORY_SURGERY_CENTER): Payer: Medicare Other | Admitting: Internal Medicine

## 2010-12-21 VITALS — BP 150/83 | HR 68 | Temp 97.9°F | Resp 14 | Ht 76.0 in | Wt 193.4 lb

## 2010-12-21 DIAGNOSIS — K2270 Barrett's esophagus without dysplasia: Secondary | ICD-10-CM

## 2010-12-21 MED ORDER — SODIUM CHLORIDE 0.9 % IV SOLN: 500.0000 mL | INTRAVENOUS | Status: DC

## 2010-12-21 NOTE — Patient Instructions (Signed)
Discharge instructions given with verbal understanding.  Handouts on Barretts and a soft diet given.  Resume previous medications.

## 2010-12-22 ENCOUNTER — Telehealth: Payer: Self-pay | Admitting: *Deleted

## 2010-12-22 NOTE — Telephone Encounter (Signed)

## 2010-12-28 ENCOUNTER — Encounter: Payer: Self-pay | Admitting: Internal Medicine

## 2011-01-23 ENCOUNTER — Ambulatory Visit (HOSPITAL_COMMUNITY)
Admission: RE | Admit: 2011-01-23 | Discharge: 2011-01-23 | Disposition: A | Discharge status: Home / Self Care | Payer: Medicare Other | Source: Ambulatory Visit | Attending: Family Medicine | Admitting: Family Medicine

## 2011-01-23 ENCOUNTER — Other Ambulatory Visit (HOSPITAL_COMMUNITY): Payer: Self-pay | Admitting: Family Medicine

## 2011-01-23 DIAGNOSIS — M51379 Other intervertebral disc degeneration, lumbosacral region without mention of lumbar back pain or lower extremity pain: Secondary | ICD-10-CM | POA: Insufficient documentation

## 2011-01-23 DIAGNOSIS — M549 Dorsalgia, unspecified: Secondary | ICD-10-CM

## 2011-01-23 DIAGNOSIS — M545 Low back pain, unspecified: Secondary | ICD-10-CM | POA: Insufficient documentation

## 2011-01-23 DIAGNOSIS — M542 Cervicalgia: Secondary | ICD-10-CM

## 2011-01-23 DIAGNOSIS — M5137 Other intervertebral disc degeneration, lumbosacral region: Secondary | ICD-10-CM | POA: Insufficient documentation

## 2011-01-23 DIAGNOSIS — M5030 Other cervical disc degeneration, unspecified cervical region: Secondary | ICD-10-CM | POA: Insufficient documentation

## 2011-05-26 ENCOUNTER — Other Ambulatory Visit (HOSPITAL_COMMUNITY): Payer: Self-pay | Admitting: Family Medicine

## 2011-05-26 DIAGNOSIS — M545 Low back pain, unspecified: Secondary | ICD-10-CM

## 2011-05-31 ENCOUNTER — Ambulatory Visit (HOSPITAL_COMMUNITY)
Admission: RE | Admit: 2011-05-31 | Discharge: 2011-05-31 | Disposition: A | Discharge status: Home / Self Care | Payer: Medicare Other | Source: Ambulatory Visit | Attending: Family Medicine | Admitting: Family Medicine

## 2011-05-31 ENCOUNTER — Ambulatory Visit (HOSPITAL_COMMUNITY): Payer: Medicare Other

## 2011-05-31 DIAGNOSIS — M545 Low back pain, unspecified: Secondary | ICD-10-CM | POA: Insufficient documentation

## 2011-05-31 DIAGNOSIS — R209 Unspecified disturbances of skin sensation: Secondary | ICD-10-CM | POA: Insufficient documentation

## 2011-05-31 DIAGNOSIS — M79609 Pain in unspecified limb: Secondary | ICD-10-CM | POA: Insufficient documentation

## 2011-05-31 DIAGNOSIS — M51379 Other intervertebral disc degeneration, lumbosacral region without mention of lumbar back pain or lower extremity pain: Secondary | ICD-10-CM | POA: Insufficient documentation

## 2011-05-31 DIAGNOSIS — M25559 Pain in unspecified hip: Secondary | ICD-10-CM | POA: Insufficient documentation

## 2011-05-31 DIAGNOSIS — M5137 Other intervertebral disc degeneration, lumbosacral region: Secondary | ICD-10-CM | POA: Insufficient documentation

## 2011-05-31 DIAGNOSIS — M5126 Other intervertebral disc displacement, lumbar region: Secondary | ICD-10-CM | POA: Insufficient documentation

## 2011-10-24 ENCOUNTER — Other Ambulatory Visit: Payer: Self-pay | Admitting: Internal Medicine

## 2011-11-09 ENCOUNTER — Encounter (HOSPITAL_COMMUNITY): Payer: Self-pay | Admitting: *Deleted

## 2011-11-09 ENCOUNTER — Emergency Department (HOSPITAL_COMMUNITY)
Admission: EM | Admit: 2011-11-09 | Discharge: 2011-11-09 | Disposition: A | Discharge status: Home / Self Care | Payer: Medicare Other | Attending: Emergency Medicine | Admitting: Emergency Medicine

## 2011-11-09 DIAGNOSIS — Z7982 Long term (current) use of aspirin: Secondary | ICD-10-CM | POA: Insufficient documentation

## 2011-11-09 DIAGNOSIS — Z87891 Personal history of nicotine dependence: Secondary | ICD-10-CM | POA: Insufficient documentation

## 2011-11-09 DIAGNOSIS — M5432 Sciatica, left side: Secondary | ICD-10-CM

## 2011-11-09 DIAGNOSIS — K2270 Barrett's esophagus without dysplasia: Secondary | ICD-10-CM | POA: Insufficient documentation

## 2011-11-09 DIAGNOSIS — Z880 Allergy status to penicillin: Secondary | ICD-10-CM | POA: Insufficient documentation

## 2011-11-09 DIAGNOSIS — M5430 Sciatica, unspecified side: Secondary | ICD-10-CM | POA: Insufficient documentation

## 2011-11-09 DIAGNOSIS — Z881 Allergy status to other antibiotic agents status: Secondary | ICD-10-CM | POA: Insufficient documentation

## 2011-11-09 DIAGNOSIS — Z79899 Other long term (current) drug therapy: Secondary | ICD-10-CM | POA: Insufficient documentation

## 2011-11-09 DIAGNOSIS — I10 Essential (primary) hypertension: Secondary | ICD-10-CM | POA: Insufficient documentation

## 2011-11-09 DIAGNOSIS — F4000 Agoraphobia, unspecified: Secondary | ICD-10-CM | POA: Insufficient documentation

## 2011-11-09 DIAGNOSIS — Z886 Allergy status to analgesic agent status: Secondary | ICD-10-CM | POA: Insufficient documentation

## 2011-11-09 DIAGNOSIS — K219 Gastro-esophageal reflux disease without esophagitis: Secondary | ICD-10-CM | POA: Insufficient documentation

## 2011-11-09 MED ORDER — OXYCODONE HCL 5 MG PO TABS: 5.0000 mg | ORAL_TABLET | ORAL | Status: DC | PRN

## 2011-11-09 MED ORDER — HYDROMORPHONE HCL PF 1 MG/ML IJ SOLN
1.0000 mg | Freq: Once | INTRAMUSCULAR | Status: AC
  Administered 2011-11-09: 1 mg via INTRAMUSCULAR
  Filled 2011-11-09: qty 1

## 2011-11-09 NOTE — ED Notes (Signed)
Pain rt hip and back . No injury.

## 2011-11-09 NOTE — ED Notes (Signed)
Waiting on pt's post injection obs time to complete for discharge.

## 2011-11-14 NOTE — ED Provider Notes (Signed)
History     CSN: [id number]  Arrival date & time 11/09/11  1827   First MD Initiated Contact with Patient 11/09/11 1854      Chief Complaint  Patient presents with  . Back Pain    (Consider location/radiation/quality/duration/timing/severity/associated sxs/prior treatment) HPI Comments: Matthew [name] presents with acute on chronic low back pain which has which has been present for the past several days.   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  There is radiation of pain into his right hip and buttock.  There has been no weakness or numbness in the lower extremities and no urinary or bowel retention or incontinence.  Patient does not have a history of cancer or IVDU.  He has been referred to Dr. Phoebe [name],  Whom he will be seeing next month for further evaluation and determination if he will require surgery for his back pain.  He has tried rest and heat with no relief of his pain.  The history is provided by the patient.    Past Medical History  Diagnosis Date  . Anxiety   . Hypertension   . GERD (gastroesophageal reflux disease)   . Barrett esophagus   . Esophageal stricture   . Vitamin B12 deficiency   . Mood disorder   . Agoraphobia   . Elevated serum creatinine     Past Surgical History  Procedure Date  . Upper gastrointestinal endoscopy   . Chest tube insertion     age 61    Family History  Problem Relation Age of Onset  . Colon cancer Neg Hx   . Diabetes Brother     History  Substance Use Topics  . Smoking status: Former Games developer  . Smokeless tobacco: Not on file  . Alcohol Use: No      Review of Systems  Constitutional: Negative for fever.  Respiratory: Negative for shortness of breath.   Cardiovascular: Negative for chest pain and leg swelling.  Gastrointestinal: Negative for abdominal pain, constipation and abdominal distention.  Genitourinary: Negative for dysuria, urgency, frequency,  flank pain and difficulty urinating.  Musculoskeletal: Positive for back pain. Negative for joint swelling and gait problem.  Skin: Negative for rash.  Neurological: Negative for weakness and numbness.    Allergies  Acetaminophen; Amoxicillin; Erythromycin; and Penicillins  Home Medications   Current Outpatient Rx  Name Route Sig Dispense Refill  . AMLODIPINE BESYLATE 10 MG PO TABS Oral Take 10 mg by mouth daily.      . ASPIRIN 81 MG PO TABS Oral Take 81 mg by mouth daily.      Marland Kitchen CARBAMAZEPINE 100 MG PO CHEW Oral Chew 100 mg by mouth 4 (four) times daily.      . CHLORPROMAZINE HCL 100 MG PO TABS Oral Take 100 mg by mouth 4 (four) times daily.      . CYANOCOBALAMIN 1000 MCG/ML IJ SOLN Intramuscular Inject 1,000 mcg into the muscle every 30 (thirty) days.      Marland Kitchen DOXYCYCLINE HYCLATE 100 MG PO TABS      . LORAZEPAM 1 MG PO TABS Oral Take 1 mg by mouth 4 (four) times daily.      Marland Kitchen NEXIUM 40 MG PO CPDR  TAKE  (1)  CAPSULE  TWICE DAILY. 60 each 4  . OXYCODONE HCL 5 MG PO TABS Oral Take 1 tablet (5 mg total) by mouth every 4 (four) hours as needed for pain. 20 tablet 0  . TRIHEXYPHENIDYL  HCL 2 MG PO TABS Oral Take 2 mg by mouth 2 (two) times daily with a meal.      . VALSARTAN 80 MG PO TABS Oral Take 80 mg by mouth daily.        BP 160/81  Pulse 81  Temp 98.4 F (36.9 C) (Oral)  Ht 6\' 4"  (1.93 m)  Wt 190 lb (86.183 kg)  BMI 23.13 kg/m2  SpO2 100%  Physical Exam  Nursing note and vitals reviewed. Constitutional: He appears well-developed and well-nourished.  HENT:  Head: Normocephalic.  Eyes: Conjunctivae are normal.  Neck: Normal range of motion. Neck supple.  Cardiovascular: Normal rate and intact distal pulses.        Pedal pulses normal.  Pulmonary/Chest: Effort normal.  Abdominal: Soft. Bowel sounds are normal. He exhibits no distension and no mass.  Musculoskeletal: Normal range of motion. He exhibits no edema.       Lumbar back: He exhibits tenderness. He exhibits no bony  tenderness, no swelling, no edema and no spasm.       Right paralumbar ttp,  Right SI joint ttp.  Neurological: He is alert. He has normal strength. He displays no atrophy and no tremor. No sensory deficit. Gait normal.  Reflex Scores:      Patellar reflexes are 2+ on the right side and 2+ on the left side.      Achilles reflexes are 2+ on the right side and 2+ on the left side.      No strength deficit noted in hip and knee flexor and extensor muscle groups.  Ankle flexion and extension intact.  Skin: Skin is warm and dry.  Psychiatric: He has a normal mood and affect.    ED Course  Procedures (including critical care time)  Labs Reviewed - No data to display No results found.   1. Left sided sciatica     Dilaudid 1 mg IM injection given with moderate relief of sx.  MDM  Oxycodone prescribed.  Heat,  Avoid lifting,  Bending.  No neuro deficit on exam or by history to suggest emergent or surgical presentation.  Also discussed worsened sx that should prompt immediate re-evaluation including distal weakness, bowel/bladder retention/incontinence.  Pt was advised that further chronic pain medications need to be obtained by his pcp or neurosurgeon.  Pt understands plan.  He was reviewed in state database prior to dc home.              Burgess Amor, [place] 11/14/11 2143

## 2011-11-15 ENCOUNTER — Other Ambulatory Visit (HOSPITAL_COMMUNITY): Payer: Self-pay | Admitting: Adult Health Nurse Practitioner

## 2011-11-15 ENCOUNTER — Ambulatory Visit (HOSPITAL_COMMUNITY)
Admission: RE | Admit: 2011-11-15 | Discharge: 2011-11-15 | Disposition: A | Discharge status: Home / Self Care | Payer: Medicare Other | Source: Ambulatory Visit | Attending: Adult Health Nurse Practitioner | Admitting: Adult Health Nurse Practitioner

## 2011-11-15 DIAGNOSIS — R52 Pain, unspecified: Secondary | ICD-10-CM

## 2011-11-15 DIAGNOSIS — M25559 Pain in unspecified hip: Secondary | ICD-10-CM | POA: Insufficient documentation

## 2011-11-17 NOTE — ED Provider Notes (Signed)
Medical screening examination/treatment/procedure(s) were performed by non-physician practitioner and as supervising physician I was immediately available for consultation/collaboration. Devoria Albe, MD, FACEP   Maxcy Givens, MD 11/17/11 [phone]

## 2011-11-19 ENCOUNTER — Encounter (HOSPITAL_COMMUNITY): Payer: Self-pay

## 2011-11-19 ENCOUNTER — Emergency Department (HOSPITAL_COMMUNITY)
Admission: EM | Admit: 2011-11-19 | Discharge: 2011-11-19 | Disposition: A | Discharge status: Home / Self Care | Payer: Medicare Other | Attending: Emergency Medicine | Admitting: Emergency Medicine

## 2011-11-19 DIAGNOSIS — M79609 Pain in unspecified limb: Secondary | ICD-10-CM | POA: Insufficient documentation

## 2011-11-19 DIAGNOSIS — I10 Essential (primary) hypertension: Secondary | ICD-10-CM | POA: Insufficient documentation

## 2011-11-19 DIAGNOSIS — Z79899 Other long term (current) drug therapy: Secondary | ICD-10-CM | POA: Insufficient documentation

## 2011-11-19 DIAGNOSIS — M5430 Sciatica, unspecified side: Secondary | ICD-10-CM

## 2011-11-19 DIAGNOSIS — M545 Low back pain, unspecified: Secondary | ICD-10-CM | POA: Insufficient documentation

## 2011-11-19 LAB — URINALYSIS, ROUTINE W REFLEX MICROSCOPIC
Bilirubin Urine: NEGATIVE
Glucose, UA: NEGATIVE mg/dL
Hgb urine dipstick: NEGATIVE
Ketones, ur: NEGATIVE mg/dL
Leukocytes, UA: NEGATIVE
Nitrite: NEGATIVE
Protein, ur: NEGATIVE mg/dL
Specific Gravity, Urine: 1.01 (ref 1.005–1.030)
Urobilinogen, UA: 0.2 mg/dL (ref 0.0–1.0)
pH: 6 (ref 5.0–8.0)

## 2011-11-19 MED ORDER — OXYCODONE HCL 5 MG PO TABS: 5.0000 mg | ORAL_TABLET | ORAL | Status: AC | PRN

## 2011-11-19 MED ORDER — KETOROLAC TROMETHAMINE 60 MG/2ML IM SOLN
60.0000 mg | Freq: Once | INTRAMUSCULAR | Status: DC
  Filled 2011-11-19: qty 2

## 2011-11-19 MED ORDER — METHYLPREDNISOLONE 4 MG PO KIT: PACK | ORAL | Status: DC

## 2011-11-19 MED ORDER — OXYCODONE HCL 5 MG PO TABS
10.0000 mg | ORAL_TABLET | Freq: Once | ORAL | Status: AC
  Administered 2011-11-19: 10 mg via ORAL
  Filled 2011-11-19: qty 2

## 2011-11-19 NOTE — ED Provider Notes (Signed)
History   This chart was scribed for Glynn Octave, MD scribed by Matthew [name]. The patient was seen in room APA12/APA12 at 17:46   CSN: [id number]  Arrival date & time 11/19/11  1729   First MD Initiated Contact with Patient 11/19/11 1742      Chief Complaint  Patient presents with  . Back Pain    (Consider location/radiation/quality/duration/timing/severity/associated sxs/prior treatment) Patient is a 61 y.o. male presenting with back pain. The history is provided by the patient. No language interpreter was used.  Back Pain    Matthew [name] is a 61 y.o. male who presents to the Emergency Department complaining of worsening moderate lower left back pain that radiates into his left leg, onset 6 days. Patient was seen here at ED 5 days ago for the same sxs. Says he was given and has taken one 10 mg oxycodone  every six hour with it last being taken 3 hours ago.  Denies fevers, vomiting, use of iv drugs, bladder/ bowel incontinence, abd pain, cough,and chest pain. Mother says he has been behaving normally, slurring speech is baseline, and denies he's been  exhibiting confusion. Pt denies falls,injuries, smoking, or use of alcohol, and reports he has hx of kidney problems.   Past Medical History  Diagnosis Date  . Anxiety   . Hypertension   . GERD (gastroesophageal reflux disease)   . Barrett esophagus   . Esophageal stricture   . Vitamin B12 deficiency   . Mood disorder   . Agoraphobia   . Elevated serum creatinine     Past Surgical History  Procedure Date  . Upper gastrointestinal endoscopy   . Chest tube insertion     age 89    Family History  Problem Relation Age of Onset  . Colon cancer Neg Hx   . Diabetes Brother     History  Substance Use Topics  . Smoking status: Former Games developer  . Smokeless tobacco: Not on file  . Alcohol Use: No      Review of Systems 10 Systems reviewed and are negative for acute change except as noted in the HPI. Allergies    Acetaminophen; Amoxicillin; Erythromycin; and Penicillins  Home Medications   Current Outpatient Rx  Name Route Sig Dispense Refill  . AMLODIPINE BESYLATE 10 MG PO TABS Oral Take 10 mg by mouth daily.      . ASPIRIN EC 81 MG PO TBEC Oral Take 81 mg by mouth daily.    Marland Kitchen CARBAMAZEPINE 200 MG PO TABS Oral Take 100 mg by mouth 4 (four) times daily.    . CHLORPROMAZINE HCL 100 MG PO TABS Oral Take 100 mg by mouth 4 (four) times daily.      Marland Kitchen LORAZEPAM 1 MG PO TABS Oral Take 1 mg by mouth 4 (four) times daily.      Marland Kitchen NEXIUM 40 MG PO CPDR  TAKE  (1)  CAPSULE  TWICE DAILY. 60 each 4  . OXYCODONE HCL 10 MG PO TABS Oral Take 10 mg by mouth every 6 (six) hours as needed. pain    . TRIHEXYPHENIDYL HCL 2 MG PO TABS Oral Take 2 mg by mouth 2 (two) times daily with a meal.      . VALSARTAN 80 MG PO TABS Oral Take 80 mg by mouth daily.      . CYANOCOBALAMIN 1000 MCG/ML IJ SOLN Intramuscular Inject 1,000 mcg into the muscle every 30 (thirty) days.        BP 157/85  Pulse  70  Temp 97.5 F (36.4 C) (Oral)  Resp 18  Ht 6\' 4"  (1.93 m)  Wt 190 lb (86.183 kg)  BMI 23.13 kg/m2  SpO2 100%  Physical Exam  Nursing note and vitals reviewed. Constitutional: He is oriented to person, place, and time. He appears well-developed and well-nourished. No distress.       Slurred speech which patient relative states is baseline  HENT:  Head: Normocephalic and atraumatic.  Eyes: Conjunctivae and EOM are normal.  Neck: Neck supple. No tracheal deviation present.  Cardiovascular: Normal rate.   Pulmonary/Chest: Effort normal. No respiratory distress.  Abdominal: Soft. He exhibits no distension and no mass.  Genitourinary:       Nml rectal tone  Musculoskeletal: Normal range of motion.       +2 patellar reflexes bilaterally. Ankle dorsiflexion intact. Ankle pt pulses intact bilaterally. Great toe dorsiflexion intact bilaterally. Left postive straight leg raise  Neurological: He is alert and oriented to person,  place, and time. No sensory deficit.  Skin: Skin is dry. He is not diaphoretic.  Psychiatric: He has a normal mood and affect. His behavior is normal.    ED Course  Procedures (including critical care time) DIAGNOSTIC STUDIES: Oxygen Saturation is 100% on room air, normal by my interpretation.    COORDINATION OF CARE: 19:20 Performs recheck. Patient says he is having mild pain at hip, but notes otherwise pain has  Improved.  Labs Reviewed  URINALYSIS, ROUTINE W REFLEX MICROSCOPIC - Abnormal; Notable for the following:    Color, Urine STRAW (*)     All other components within normal limits   No results found.   No diagnosis found.    MDM  Left-sided low back pain since August 18 radiating down left leg.  Seen 5 days ago for same. No weakness, numbness, tingling, bowel or bladder incontinence, fevers or vomiting.  Normal rectal tone, postvoid residual 45 mL. Urinalysis negative.  No neurological deficits or red flags on exam. Followup with Dr. Phoebe Perch as scheduled. Return precautions discussed including incontinence, weakness, worsening pain.  I personally performed the services described in this documentation, which was scribed in my presence.  The recorded information has been reviewed and considered.         Glynn Octave, MD 11/19/11 2042

## 2011-11-19 NOTE — ED Notes (Signed)
Family at bedside. Patient is comfortable at this time. 

## 2011-11-19 NOTE — ED Notes (Signed)
Pinched nerve since July 18th, has been seen by pmd, given oxycodone for pain and is not helping. Pt with rambling thoughts in triage.

## 2011-11-19 NOTE — ED Notes (Signed)
Pt c/o left side back pain that radiates down left side since July 18. Pt has been tx and rx pain meds without relief. Pain becomes worse with movement.

## 2011-11-22 ENCOUNTER — Emergency Department (HOSPITAL_COMMUNITY): Payer: Medicare Other

## 2011-11-22 ENCOUNTER — Encounter (HOSPITAL_COMMUNITY): Payer: Self-pay | Admitting: Emergency Medicine

## 2011-11-22 ENCOUNTER — Emergency Department (HOSPITAL_COMMUNITY)
Admission: EM | Admit: 2011-11-22 | Discharge: 2011-11-22 | Disposition: A | Discharge status: Home / Self Care | Payer: Medicare Other | Attending: Emergency Medicine | Admitting: Emergency Medicine

## 2011-11-22 DIAGNOSIS — F411 Generalized anxiety disorder: Secondary | ICD-10-CM | POA: Insufficient documentation

## 2011-11-22 DIAGNOSIS — I10 Essential (primary) hypertension: Secondary | ICD-10-CM | POA: Insufficient documentation

## 2011-11-22 DIAGNOSIS — Z7982 Long term (current) use of aspirin: Secondary | ICD-10-CM | POA: Insufficient documentation

## 2011-11-22 DIAGNOSIS — IMO0001 Reserved for inherently not codable concepts without codable children: Secondary | ICD-10-CM | POA: Insufficient documentation

## 2011-11-22 DIAGNOSIS — K219 Gastro-esophageal reflux disease without esophagitis: Secondary | ICD-10-CM | POA: Insufficient documentation

## 2011-11-22 DIAGNOSIS — Z79899 Other long term (current) drug therapy: Secondary | ICD-10-CM | POA: Insufficient documentation

## 2011-11-22 DIAGNOSIS — G8929 Other chronic pain: Secondary | ICD-10-CM | POA: Insufficient documentation

## 2011-11-22 DIAGNOSIS — M549 Dorsalgia, unspecified: Secondary | ICD-10-CM | POA: Insufficient documentation

## 2011-11-22 MED ORDER — HYDROMORPHONE HCL 2 MG PO TABS: 2.0000 mg | ORAL_TABLET | ORAL | Status: AC | PRN

## 2011-11-22 MED ORDER — SODIUM CHLORIDE 0.9 % IV SOLN
Freq: Once | INTRAVENOUS | Status: AC
  Administered 2011-11-22: 20 mL/h via INTRAVENOUS

## 2011-11-22 MED ORDER — ONDANSETRON HCL 4 MG/2ML IJ SOLN
4.0000 mg | Freq: Once | INTRAMUSCULAR | Status: AC
  Administered 2011-11-22: 4 mg via INTRAVENOUS
  Filled 2011-11-22: qty 2

## 2011-11-22 MED ORDER — HYDROMORPHONE HCL PF 1 MG/ML IJ SOLN
1.0000 mg | Freq: Once | INTRAMUSCULAR | Status: AC
  Administered 2011-11-22: 1 mg via INTRAVENOUS
  Filled 2011-11-22: qty 1

## 2011-11-22 MED ORDER — HYDROMORPHONE HCL PF 1 MG/ML IJ SOLN
1.0000 mg | Freq: Once | INTRAMUSCULAR | Status: AC
  Administered 2011-11-22: 1 mg via INTRAMUSCULAR
  Filled 2011-11-22: qty 1

## 2011-11-22 MED ORDER — HYDROMORPHONE HCL PF 1 MG/ML IJ SOLN: 1.0000 mg | Freq: Once | INTRAMUSCULAR | Status: DC

## 2011-11-22 NOTE — ED Notes (Signed)
Pt has history of back pain and sciatica pain. Pt states unable to walk today.

## 2011-11-22 NOTE — ED Provider Notes (Signed)
History  This chart was scribed for Matthew Lennert, MD by Erskine Emery. This patient was seen in room APAH6/APAH6 and the patient's care was started at 15:18.   CSN: [id number]  Arrival date & time 11/22/11  1455   First MD Initiated Contact with Patient 11/22/11 1518      Chief Complaint  Patient presents with  . Back Pain    (Consider location/radiation/quality/duration/timing/severity/associated sxs/prior Treatment) Matthew [name] is a 61 y.o. male brought in by ambulance, who presents to the Emergency Department complaining of back pain for the past several months. Pt reports the pain is located in his left buttocks, it does not radiate, and he cannot turn to lay on his right side as a result of a pinched nerve. Pt saw Dr. Phoebe Perch in [place] on July 15th for his back pain, was given a medication injection, and told to return in 4 weeks for an MRI if his pain continued. Pt still has not had the MRI. Pt also reports some kidney issues but denies any difficulty urinating. Pt was prescribed new medications on Saturday (3 days ago). Pt was given 10 mg of morphine via EMS PTA.  Pt reports he is allergic to Acetaminophen, amoxicillin, erythromycin, Nsaids, and penicillins.  Patient is a 61 y.o. male presenting with back pain. The history is provided by the patient. No language interpreter was used.  Back Pain  This is a chronic problem. The current episode started more than 1 week ago. The problem occurs constantly. The problem has not changed since onset.The pain is associated with no known injury. The pain is present in the gluteal region. The pain does not radiate. The pain is moderate. The symptoms are aggravated by twisting and certain positions. Pertinent negatives include no chest pain, no fever, no headaches, no abdominal pain, no bladder incontinence, no dysuria and no paresis.   Dr. Lysbeth Galas is the pt's PCP.     Past Medical History  Diagnosis Date  . Anxiety   . Hypertension     . GERD (gastroesophageal reflux disease)   . Barrett esophagus   . Esophageal stricture   . Vitamin B12 deficiency   . Mood disorder   . Agoraphobia   . Elevated serum creatinine     Past Surgical History  Procedure Date  . Upper gastrointestinal endoscopy   . Chest tube insertion     age 64    Family History  Problem Relation Age of Onset  . Colon cancer Neg Hx   . Diabetes Brother     History  Substance Use Topics  . Smoking status: Former Games developer  . Smokeless tobacco: Not on file  . Alcohol Use: No      Review of Systems  Constitutional: Negative for fever and fatigue.  HENT: Negative for congestion, sinus pressure and ear discharge.   Eyes: Negative for discharge.  Respiratory: Negative for cough.   Cardiovascular: Negative for chest pain.  Gastrointestinal: Negative for abdominal pain and diarrhea.  Genitourinary: Negative for bladder incontinence, dysuria, frequency, hematuria and difficulty urinating.  Musculoskeletal: Positive for back pain.  Skin: Negative for rash.  Neurological: Negative for seizures and headaches.  Hematological: Negative.   Psychiatric/Behavioral: Negative for hallucinations.  All other systems reviewed and are negative.    Allergies  Acetaminophen; Amoxicillin; Erythromycin; Nsaids; and Penicillins  Home Medications   Current Outpatient Rx  Name Route Sig Dispense Refill  . AMLODIPINE BESYLATE 10 MG PO TABS Oral Take 10 mg by mouth daily.      Marland Kitchen  ASPIRIN EC 81 MG PO TBEC Oral Take 81 mg by mouth daily.    Marland Kitchen CARBAMAZEPINE 200 MG PO TABS Oral Take 100 mg by mouth 4 (four) times daily.    . CHLORPROMAZINE HCL 100 MG PO TABS Oral Take 100 mg by mouth 4 (four) times daily.      Marland Kitchen ESOMEPRAZOLE MAGNESIUM 40 MG PO CPDR Oral Take 40 mg by mouth 2 (two) times daily.    Marland Kitchen LORAZEPAM 1 MG PO TABS Oral Take 1 mg by mouth 4 (four) times daily. BRAND ONLY    . TRIHEXYPHENIDYL HCL 2 MG PO TABS Oral Take 2 mg by mouth 2 (two) times daily with  a meal.      . VALSARTAN 80 MG PO TABS Oral Take 80 mg by mouth daily.      . CYANOCOBALAMIN 1000 MCG/ML IJ SOLN Intramuscular Inject 1,000 mcg into the muscle every 30 (thirty) days.      . METHYLPREDNISOLONE 4 MG PO KIT  follow package directions 21 tablet 0  . OXYCODONE HCL 5 MG PO TABS Oral Take 1 tablet (5 mg total) by mouth every 4 (four) hours as needed for pain. 15 tablet 0  . OXYCODONE HCL 10 MG PO TABS Oral Take 10 mg by mouth every 6 (six) hours as needed. pain      Triage Vitals: BP 138/77  Pulse 75  Temp 98 F (36.7 C) (Oral)  Resp 20  Ht 6\' 5"  (1.956 m)  Wt 190 lb (86.183 kg)  BMI 22.53 kg/m2  SpO2 99%  Physical Exam  Constitutional: He is oriented to person, place, and time. He appears well-developed.  HENT:  Head: Normocephalic and atraumatic.  Eyes: Conjunctivae and EOM are normal. No scleral icterus.  Neck: Neck supple. No thyromegaly present.  Cardiovascular: Normal rate and regular rhythm.  Exam reveals no gallop and no friction rub.   No murmur heard. Pulmonary/Chest: No stridor. He has no wheezes. He has no rales. He exhibits no tenderness.  Abdominal: He exhibits no distension. There is no tenderness. There is no rebound.  Musculoskeletal: Normal range of motion. He exhibits no edema.       Tender in left buttocks, positive SLR on that side.  Lymphadenopathy:    He has no cervical adenopathy.  Neurological: He is oriented to person, place, and time. Coordination normal.  Skin: No rash noted. No erythema.  Psychiatric: He has a normal mood and affect. His behavior is normal.    ED Course  Procedures (including critical care time) DIAGNOSTIC STUDIES: Oxygen Saturation is 99% on room air, normal by my interpretation.    COORDINATION OF CARE: 15:35--I evaluated the patient and we discussed a treatment plan including pain medication and MRI to which the pt agreed.   18:53--I rechecked the pt and notified him about the unchanged results of his imaging. I  instructed him to follow up with Dr. Phoebe Perch.   Labs Reviewed - No data to display No results found.   No diagnosis found.    MDM        .hj The chart was scribed for me under my direct supervision.  I personally performed the history, physical, and medical decision making and all procedures in the evaluation of this patient.Matthew Lennert, MD 11/22/11 [phone]

## 2011-11-22 NOTE — ED Notes (Signed)
Pt received 10mg  morphine per EMS

## 2011-12-05 ENCOUNTER — Encounter (HOSPITAL_COMMUNITY): Payer: Self-pay

## 2011-12-05 ENCOUNTER — Other Ambulatory Visit: Payer: Self-pay | Admitting: Neurosurgery

## 2011-12-05 ENCOUNTER — Encounter (HOSPITAL_COMMUNITY): Payer: Self-pay | Admitting: Pharmacy Technician

## 2011-12-06 ENCOUNTER — Inpatient Hospital Stay (HOSPITAL_COMMUNITY): Payer: Medicare Other

## 2011-12-06 ENCOUNTER — Encounter (HOSPITAL_COMMUNITY): Payer: Self-pay | Admitting: *Deleted

## 2011-12-06 ENCOUNTER — Encounter (HOSPITAL_COMMUNITY)
Admission: RE | Disposition: A | Discharge status: Home / Self Care | Payer: Self-pay | Source: Ambulatory Visit | Attending: Neurosurgery

## 2011-12-06 ENCOUNTER — Inpatient Hospital Stay (HOSPITAL_COMMUNITY)
Admission: RE | Admit: 2011-12-06 | Discharge: 2011-12-07 | DRG: 491 | Disposition: A | Discharge status: Home / Self Care | Payer: Medicare Other | Source: Ambulatory Visit | Attending: Neurosurgery | Admitting: Neurosurgery

## 2011-12-06 ENCOUNTER — Encounter (HOSPITAL_COMMUNITY): Payer: Self-pay | Admitting: Anesthesiology

## 2011-12-06 ENCOUNTER — Inpatient Hospital Stay (HOSPITAL_COMMUNITY): Payer: Medicare Other | Admitting: Anesthesiology

## 2011-12-06 DIAGNOSIS — J45909 Unspecified asthma, uncomplicated: Secondary | ICD-10-CM | POA: Diagnosis present

## 2011-12-06 DIAGNOSIS — M47817 Spondylosis without myelopathy or radiculopathy, lumbosacral region: Secondary | ICD-10-CM | POA: Diagnosis present

## 2011-12-06 DIAGNOSIS — I10 Essential (primary) hypertension: Secondary | ICD-10-CM | POA: Diagnosis present

## 2011-12-06 DIAGNOSIS — M5126 Other intervertebral disc displacement, lumbar region: Principal | ICD-10-CM | POA: Diagnosis present

## 2011-12-06 DIAGNOSIS — K219 Gastro-esophageal reflux disease without esophagitis: Secondary | ICD-10-CM | POA: Diagnosis present

## 2011-12-06 DIAGNOSIS — F411 Generalized anxiety disorder: Secondary | ICD-10-CM | POA: Diagnosis present

## 2011-12-06 HISTORY — DX: Unspecified asthma, uncomplicated: J45.909

## 2011-12-06 HISTORY — PX: LUMBAR LAMINECTOMY/DECOMPRESSION MICRODISCECTOMY: SHX5026

## 2011-12-06 HISTORY — DX: Other chronic pain: G89.29

## 2011-12-06 HISTORY — DX: Unspecified osteoarthritis, unspecified site: M19.90

## 2011-12-06 HISTORY — DX: Bronchitis, not specified as acute or chronic: J40

## 2011-12-06 HISTORY — DX: Other complications of anesthesia, initial encounter: T88.59XA

## 2011-12-06 HISTORY — DX: Cervicalgia: M54.2

## 2011-12-06 HISTORY — DX: Adverse effect of unspecified anesthetic, initial encounter: T41.45XA

## 2011-12-06 HISTORY — DX: Anemia, unspecified: D64.9

## 2011-12-06 LAB — APTT: aPTT: 48 seconds — ABNORMAL HIGH (ref 24–37)

## 2011-12-06 LAB — URINALYSIS, ROUTINE W REFLEX MICROSCOPIC
Bilirubin Urine: NEGATIVE
Glucose, UA: NEGATIVE mg/dL
Hgb urine dipstick: NEGATIVE
Ketones, ur: NEGATIVE mg/dL
Leukocytes, UA: NEGATIVE
Nitrite: NEGATIVE
Protein, ur: NEGATIVE mg/dL
Specific Gravity, Urine: 1.009 (ref 1.005–1.030)
Urobilinogen, UA: 1 mg/dL (ref 0.0–1.0)
pH: 6.5 (ref 5.0–8.0)

## 2011-12-06 LAB — BASIC METABOLIC PANEL
BUN: 20 mg/dL (ref 6–23)
CO2: 26 mEq/L (ref 19–32)
Calcium: 10.1 mg/dL (ref 8.4–10.5)
Chloride: 104 mEq/L (ref 96–112)
Creatinine, Ser: 1.44 mg/dL — ABNORMAL HIGH (ref 0.50–1.35)
GFR calc Af Amer: 60 mL/min — ABNORMAL LOW (ref >90)
GFR calc non Af Amer: 52 mL/min — ABNORMAL LOW (ref >90)
Glucose, Bld: 104 mg/dL — ABNORMAL HIGH (ref 70–99)
Potassium: 4 mEq/L (ref 3.5–5.1)
Sodium: 141 mEq/L (ref 135–145)

## 2011-12-06 LAB — CBC WITH DIFFERENTIAL/PLATELET
Basophils Absolute: 0.1 10*3/uL (ref 0.0–0.1)
Basophils Relative: 1 % (ref 0–1)
Eosinophils Absolute: 0.3 10*3/uL (ref 0.0–0.7)
Eosinophils Relative: 3 % (ref 0–5)
HCT: 39.6 % (ref 39.0–52.0)
Hemoglobin: 14 g/dL (ref 13.0–17.0)
Lymphocytes Relative: 14 % (ref 12–46)
Lymphs Abs: 1.4 10*3/uL (ref 0.7–4.0)
MCH: 30.2 pg (ref 26.0–34.0)
MCHC: 35.4 g/dL (ref 30.0–36.0)
MCV: 85.3 fL (ref 78.0–100.0)
Monocytes Absolute: 0.8 10*3/uL (ref 0.1–1.0)
Monocytes Relative: 8 % (ref 3–12)
Neutro Abs: 7.5 10*3/uL (ref 1.7–7.7)
Neutrophils Relative %: 75 % (ref 43–77)
Platelets: 213 10*3/uL (ref 150–400)
RBC: 4.64 MIL/uL (ref 4.22–5.81)
RDW: 12.1 % (ref 11.5–15.5)
WBC: 10 10*3/uL (ref 4.0–10.5)

## 2011-12-06 LAB — PROTIME-INR
INR: 1.09 (ref 0.00–1.49)
Prothrombin Time: 14.3 seconds (ref 11.6–15.2)

## 2011-12-06 LAB — SURGICAL PCR SCREEN
MRSA, PCR: NEGATIVE
Staphylococcus aureus: NEGATIVE

## 2011-12-06 SURGERY — LUMBAR LAMINECTOMY/DECOMPRESSION MICRODISCECTOMY 1 LEVEL
Anesthesia: General | Site: Back | Laterality: Bilateral | Wound class: Clean

## 2011-12-06 MED ORDER — METHOCARBAMOL 100 MG/ML IJ SOLN
500.0000 mg | Freq: Four times a day (QID) | INTRAMUSCULAR | Status: DC | PRN
  Filled 2011-12-06: qty 5

## 2011-12-06 MED ORDER — LORAZEPAM 0.5 MG PO TABS: 1.0000 mg | ORAL_TABLET | Freq: Four times a day (QID) | ORAL | Status: DC

## 2011-12-06 MED ORDER — OXYCODONE HCL 5 MG PO TABS
ORAL_TABLET | ORAL | Status: AC
  Filled 2011-12-06: qty 2

## 2011-12-06 MED ORDER — DOCUSATE SODIUM 100 MG PO CAPS
100.0000 mg | ORAL_CAPSULE | Freq: Two times a day (BID) | ORAL | Status: DC
  Administered 2011-12-06 – 2011-12-07 (×2): 100 mg via ORAL
  Filled 2011-12-06 (×2): qty 1

## 2011-12-06 MED ORDER — SODIUM CHLORIDE 0.9 % IV SOLN: 250.0000 mL | INTRAVENOUS | Status: DC

## 2011-12-06 MED ORDER — BACITRACIN 50000 UNITS IM SOLR
INTRAMUSCULAR | Status: AC
  Filled 2011-12-06: qty 1

## 2011-12-06 MED ORDER — CHLORPROMAZINE HCL 100 MG PO TABS
100.0000 mg | ORAL_TABLET | Freq: Four times a day (QID) | ORAL | Status: DC
  Administered 2011-12-06 – 2011-12-07 (×2): 100 mg via ORAL
  Filled 2011-12-06 (×5): qty 1

## 2011-12-06 MED ORDER — MIDAZOLAM HCL 2 MG/2ML IJ SOLN
2.0000 mg | Freq: Once | INTRAMUSCULAR | Status: AC
  Administered 2011-12-06: 2 mg via INTRAVENOUS

## 2011-12-06 MED ORDER — MAGNESIUM HYDROXIDE 400 MG/5ML PO SUSP: 30.0000 mL | Freq: Every day | ORAL | Status: DC | PRN

## 2011-12-06 MED ORDER — GLYCOPYRROLATE 0.2 MG/ML IJ SOLN
INTRAMUSCULAR | Status: DC | PRN
  Administered 2011-12-06: 0.2 mg via INTRAVENOUS
  Administered 2011-12-06: 0.4 mg via INTRAVENOUS

## 2011-12-06 MED ORDER — PANTOPRAZOLE SODIUM 40 MG PO TBEC: 80.0000 mg | DELAYED_RELEASE_TABLET | Freq: Every day | ORAL | Status: DC

## 2011-12-06 MED ORDER — SODIUM CHLORIDE 0.9 % IJ SOLN
3.0000 mL | Freq: Two times a day (BID) | INTRAMUSCULAR | Status: DC
  Administered 2011-12-06: 3 mL via INTRAVENOUS

## 2011-12-06 MED ORDER — AMLODIPINE BESYLATE 10 MG PO TABS
10.0000 mg | ORAL_TABLET | Freq: Every day | ORAL | Status: DC
  Administered 2011-12-07: 10 mg via ORAL
  Filled 2011-12-06: qty 1

## 2011-12-06 MED ORDER — HYDROMORPHONE HCL PF 1 MG/ML IJ SOLN: 0.2500 mg | INTRAMUSCULAR | Status: DC | PRN

## 2011-12-06 MED ORDER — LACTATED RINGERS IV SOLN: INTRAVENOUS | Status: DC

## 2011-12-06 MED ORDER — SODIUM CHLORIDE 0.9 % IJ SOLN: 3.0000 mL | INTRAMUSCULAR | Status: DC | PRN

## 2011-12-06 MED ORDER — METHOCARBAMOL 500 MG PO TABS: 500.0000 mg | ORAL_TABLET | Freq: Four times a day (QID) | ORAL | Status: DC | PRN

## 2011-12-06 MED ORDER — IRBESARTAN 75 MG PO TABS
75.0000 mg | ORAL_TABLET | Freq: Every day | ORAL | Status: DC
  Administered 2011-12-07: 75 mg via ORAL
  Filled 2011-12-06: qty 1

## 2011-12-06 MED ORDER — ONDANSETRON HCL 4 MG/2ML IJ SOLN: 4.0000 mg | Freq: Four times a day (QID) | INTRAMUSCULAR | Status: DC | PRN

## 2011-12-06 MED ORDER — ACETAMINOPHEN 325 MG PO TABS: 650.0000 mg | ORAL_TABLET | ORAL | Status: DC | PRN

## 2011-12-06 MED ORDER — VANCOMYCIN HCL IN DEXTROSE 1-5 GM/200ML-% IV SOLN
1000.0000 mg | Freq: Once | INTRAVENOUS | Status: AC
  Administered 2011-12-07: 1000 mg via INTRAVENOUS
  Filled 2011-12-06: qty 200

## 2011-12-06 MED ORDER — TRAMADOL HCL 50 MG PO TABS
50.0000 mg | ORAL_TABLET | Freq: Four times a day (QID) | ORAL | Status: DC | PRN
  Administered 2011-12-07: 50 mg via ORAL
  Filled 2011-12-06: qty 1

## 2011-12-06 MED ORDER — PROMETHAZINE HCL 25 MG/ML IJ SOLN: 12.5000 mg | INTRAMUSCULAR | Status: DC | PRN

## 2011-12-06 MED ORDER — HEMOSTATIC AGENTS (NO CHARGE) OPTIME
TOPICAL | Status: DC | PRN
  Administered 2011-12-06: 1 via TOPICAL

## 2011-12-06 MED ORDER — CARBAMAZEPINE 200 MG PO TABS
100.0000 mg | ORAL_TABLET | Freq: Four times a day (QID) | ORAL | Status: DC
Start: 2011-12-06 — End: 2011-12-07
  Administered 2011-12-06 – 2011-12-07 (×2): 100 mg via ORAL
  Filled 2011-12-06 (×5): qty 0.5

## 2011-12-06 MED ORDER — LIDOCAINE HCL 4 % MT SOLN
OROMUCOSAL | Status: DC | PRN
  Administered 2011-12-06: 4 mL via TOPICAL

## 2011-12-06 MED ORDER — BISACODYL 10 MG RE SUPP: 10.0000 mg | Freq: Every day | RECTAL | Status: DC | PRN

## 2011-12-06 MED ORDER — SODIUM CHLORIDE 0.9 % IV SOLN
INTRAVENOUS | Status: AC
  Filled 2011-12-06: qty 500

## 2011-12-06 MED ORDER — ACETAMINOPHEN 650 MG RE SUPP: 650.0000 mg | RECTAL | Status: DC | PRN

## 2011-12-06 MED ORDER — MIDAZOLAM HCL 2 MG/2ML IJ SOLN
INTRAMUSCULAR | Status: AC
  Filled 2011-12-06: qty 2

## 2011-12-06 MED ORDER — ONDANSETRON HCL 4 MG/2ML IJ SOLN: 4.0000 mg | INTRAMUSCULAR | Status: DC | PRN

## 2011-12-06 MED ORDER — SODIUM CHLORIDE 0.9 % IR SOLN
Status: DC | PRN
  Administered 2011-12-06: 15:00:00

## 2011-12-06 MED ORDER — CYCLOBENZAPRINE HCL 10 MG PO TABS
10.0000 mg | ORAL_TABLET | Freq: Three times a day (TID) | ORAL | Status: DC | PRN
  Administered 2011-12-07: 10 mg via ORAL
  Filled 2011-12-06: qty 1

## 2011-12-06 MED ORDER — LIDOCAINE-EPINEPHRINE 2 %-1:100000 IJ SOLN
INTRAMUSCULAR | Status: DC | PRN
  Administered 2011-12-06: 20 mL

## 2011-12-06 MED ORDER — FENTANYL CITRATE 0.05 MG/ML IJ SOLN
INTRAMUSCULAR | Status: AC
  Filled 2011-12-06: qty 2

## 2011-12-06 MED ORDER — LIDOCAINE-EPINEPHRINE 1 %-1:100000 IJ SOLN
INTRAMUSCULAR | Status: DC | PRN
  Administered 2011-12-06: 10 mL

## 2011-12-06 MED ORDER — ONDANSETRON HCL 4 MG/2ML IJ SOLN
INTRAMUSCULAR | Status: DC | PRN
  Administered 2011-12-06: 4 mg via INTRAVENOUS

## 2011-12-06 MED ORDER — FENTANYL CITRATE 0.05 MG/ML IJ SOLN
50.0000 ug | INTRAMUSCULAR | Status: DC | PRN
  Administered 2011-12-06 (×2): 50 ug via INTRAVENOUS

## 2011-12-06 MED ORDER — NEOSTIGMINE METHYLSULFATE 1 MG/ML IJ SOLN
INTRAMUSCULAR | Status: DC | PRN
  Administered 2011-12-06: 3 mg via INTRAVENOUS

## 2011-12-06 MED ORDER — ROCURONIUM BROMIDE 100 MG/10ML IV SOLN
INTRAVENOUS | Status: DC | PRN
  Administered 2011-12-06: 50 mg via INTRAVENOUS
  Administered 2011-12-06: 20 mg via INTRAVENOUS

## 2011-12-06 MED ORDER — 0.9 % SODIUM CHLORIDE (POUR BTL) OPTIME
TOPICAL | Status: DC | PRN
  Administered 2011-12-06: 1000 mL

## 2011-12-06 MED ORDER — PROPOFOL 10 MG/ML IV BOLUS
INTRAVENOUS | Status: DC | PRN
  Administered 2011-12-06: 40 mg via INTRAVENOUS
  Administered 2011-12-06: 200 mg via INTRAVENOUS

## 2011-12-06 MED ORDER — PHENYLEPHRINE HCL 10 MG/ML IJ SOLN
INTRAMUSCULAR | Status: DC | PRN
  Administered 2011-12-06 (×8): 80 ug via INTRAVENOUS

## 2011-12-06 MED ORDER — VANCOMYCIN HCL IN DEXTROSE 1-5 GM/200ML-% IV SOLN
1000.0000 mg | INTRAVENOUS | Status: AC
  Administered 2011-12-06: 1000 mg via INTRAVENOUS
  Filled 2011-12-06: qty 200

## 2011-12-06 MED ORDER — LIDOCAINE HCL (CARDIAC) 20 MG/ML IV SOLN
INTRAVENOUS | Status: DC | PRN
  Administered 2011-12-06: 100 mg via INTRAVENOUS

## 2011-12-06 MED ORDER — MIDAZOLAM HCL 5 MG/5ML IJ SOLN
INTRAMUSCULAR | Status: DC | PRN
  Administered 2011-12-06: 2 mg via INTRAVENOUS

## 2011-12-06 MED ORDER — OXYCODONE HCL 5 MG PO TABS
10.0000 mg | ORAL_TABLET | ORAL | Status: DC | PRN
  Administered 2011-12-07 (×2): 10 mg via ORAL
  Filled 2011-12-06 (×3): qty 2

## 2011-12-06 MED ORDER — TRIHEXYPHENIDYL HCL 2 MG PO TABS
2.0000 mg | ORAL_TABLET | Freq: Two times a day (BID) | ORAL | Status: DC
  Administered 2011-12-07: 2 mg via ORAL
  Filled 2011-12-06 (×3): qty 1

## 2011-12-06 MED ORDER — MUPIROCIN 2 % EX OINT
TOPICAL_OINTMENT | Freq: Two times a day (BID) | CUTANEOUS | Status: DC
  Administered 2011-12-06: 1 via NASAL
  Filled 2011-12-06: qty 22

## 2011-12-06 MED ORDER — FENTANYL CITRATE 0.05 MG/ML IJ SOLN
INTRAMUSCULAR | Status: DC | PRN
  Administered 2011-12-06 (×2): 100 ug via INTRAVENOUS
  Administered 2011-12-06: 250 ug via INTRAVENOUS

## 2011-12-06 MED ORDER — THROMBIN 5000 UNITS EX SOLR
CUTANEOUS | Status: DC | PRN
  Administered 2011-12-06 (×2): 5000 [IU] via TOPICAL

## 2011-12-06 MED ORDER — OXYCODONE HCL 5 MG PO TABS
10.0000 mg | ORAL_TABLET | Freq: Once | ORAL | Status: AC
  Administered 2011-12-06: 10 mg via ORAL

## 2011-12-06 MED ORDER — MORPHINE SULFATE 2 MG/ML IJ SOLN
1.0000 mg | INTRAMUSCULAR | Status: DC | PRN
Start: 2011-12-06 — End: 2011-12-07

## 2011-12-06 MED ORDER — CYANOCOBALAMIN 1000 MCG/ML IJ SOLN
1000.0000 ug | INTRAMUSCULAR | Status: DC
  Filled 2011-12-06: qty 1

## 2011-12-06 MED ORDER — LACTATED RINGERS IV SOLN
INTRAVENOUS | Status: DC | PRN
  Administered 2011-12-06 (×3): via INTRAVENOUS

## 2011-12-06 MED ORDER — PROMETHAZINE HCL 25 MG PO TABS: 12.5000 mg | ORAL_TABLET | ORAL | Status: DC | PRN

## 2011-12-06 SURGICAL SUPPLY — 57 items
APL SKNCLS STERI-STRIP NONHPOA (GAUZE/BANDAGES/DRESSINGS) ×1
BAG DECANTER FOR FLEXI CONT (MISCELLANEOUS) ×2 IMPLANT
BENZOIN TINCTURE PRP APPL 2/3 (GAUZE/BANDAGES/DRESSINGS) ×2 IMPLANT
BLADE SURG ROTATE 9660 (MISCELLANEOUS) IMPLANT
BUR ROUND FLUTED 5 RND (BURR) ×2 IMPLANT
CANISTER SUCTION 2500CC (MISCELLANEOUS) ×2 IMPLANT
CLOTH BEACON ORANGE TIMEOUT ST (SAFETY) ×2 IMPLANT
CONT SPEC 4OZ CLIKSEAL STRL BL (MISCELLANEOUS) ×1 IMPLANT
DRAPE INCISE IOBAN 66X45 STRL (DRAPES) ×1 IMPLANT
DRAPE LAPAROTOMY 100X72X124 (DRAPES) ×3 IMPLANT
DRAPE MICROSCOPE LEICA (MISCELLANEOUS) ×1 IMPLANT
DRAPE MICROSCOPE ZEISS OPMI (DRAPES) ×1 IMPLANT
DRAPE POUCH INSTRU U-SHP 10X18 (DRAPES) ×2 IMPLANT
DRAPE SURG 17X23 STRL (DRAPES) ×3 IMPLANT
DRESSING TELFA 8X3 (GAUZE/BANDAGES/DRESSINGS) ×2 IMPLANT
DURAPREP 26ML APPLICATOR (WOUND CARE) ×3 IMPLANT
ELECT REM PT RETURN 9FT ADLT (ELECTROSURGICAL) ×2
ELECTRODE REM PT RTRN 9FT ADLT (ELECTROSURGICAL) ×1 IMPLANT
GAUZE SPONGE 4X4 16PLY XRAY LF (GAUZE/BANDAGES/DRESSINGS) IMPLANT
GLOVE BIOGEL PI IND STRL 7.0 (GLOVE) IMPLANT
GLOVE BIOGEL PI IND STRL 7.5 (GLOVE) IMPLANT
GLOVE BIOGEL PI INDICATOR 7.0 (GLOVE) ×1
GLOVE BIOGEL PI INDICATOR 7.5 (GLOVE) ×1
GLOVE ECLIPSE 7.5 STRL STRAW (GLOVE) ×5 IMPLANT
GLOVE EXAM NITRILE LRG STRL (GLOVE) IMPLANT
GLOVE EXAM NITRILE MD LF STRL (GLOVE) IMPLANT
GLOVE EXAM NITRILE XL STR (GLOVE) IMPLANT
GLOVE EXAM NITRILE XS STR PU (GLOVE) IMPLANT
GLOVE SURG SS PI 6.5 STRL IVOR (GLOVE) ×1 IMPLANT
GOWN BRE IMP SLV AUR LG STRL (GOWN DISPOSABLE) ×5 IMPLANT
GOWN BRE IMP SLV AUR XL STRL (GOWN DISPOSABLE) IMPLANT
GOWN STRL REIN 2XL LVL4 (GOWN DISPOSABLE) IMPLANT
KIT BASIN OR (CUSTOM PROCEDURE TRAY) ×2 IMPLANT
KIT ROOM TURNOVER OR (KITS) ×2 IMPLANT
NDL HYPO 18GX1.5 BLUNT FILL (NEEDLE) IMPLANT
NEEDLE HYPO 18GX1.5 BLUNT FILL (NEEDLE) IMPLANT
NEEDLE HYPO 22GX1.5 SAFETY (NEEDLE) ×4 IMPLANT
NS IRRIG 1000ML POUR BTL (IV SOLUTION) ×2 IMPLANT
PACK LAMINECTOMY NEURO (CUSTOM PROCEDURE TRAY) ×3 IMPLANT
PAD ARMBOARD 7.5X6 YLW CONV (MISCELLANEOUS) ×6 IMPLANT
PATTIES SURGICAL .75X.75 (GAUZE/BANDAGES/DRESSINGS) ×2 IMPLANT
RUBBERBAND STERILE (MISCELLANEOUS) ×4 IMPLANT
SPONGE GAUZE 4X4 12PLY (GAUZE/BANDAGES/DRESSINGS) ×2 IMPLANT
SPONGE LAP 4X18 X RAY DECT (DISPOSABLE) IMPLANT
SPONGE SURGIFOAM ABS GEL SZ50 (HEMOSTASIS) ×2 IMPLANT
STRIP CLOSURE SKIN 1/2X4 (GAUZE/BANDAGES/DRESSINGS) ×2 IMPLANT
SUT PROLENE 6 0 BV (SUTURE) IMPLANT
SUT VIC AB 0 CT1 18XCR BRD8 (SUTURE) ×1 IMPLANT
SUT VIC AB 0 CT1 8-18 (SUTURE) ×2
SUT VIC AB 2-0 CP2 18 (SUTURE) ×2 IMPLANT
SUT VIC AB 3-0 SH 8-18 (SUTURE) ×2 IMPLANT
SYR 20CC LL (SYRINGE) ×2 IMPLANT
SYR 5ML LL (SYRINGE) IMPLANT
TAPE CLOTH SURG 4X10 WHT LF (GAUZE/BANDAGES/DRESSINGS) ×1 IMPLANT
TOWEL OR 17X24 6PK STRL BLUE (TOWEL DISPOSABLE) ×2 IMPLANT
TOWEL OR 17X26 10 PK STRL BLUE (TOWEL DISPOSABLE) ×3 IMPLANT
WATER STERILE IRR 1000ML POUR (IV SOLUTION) ×2 IMPLANT

## 2011-12-06 NOTE — Op Note (Signed)
12/06/2011  5:15 PM  PATIENT:  Matthew [name]  61 y.o. male  PRE-OPERATIVE DIAGNOSIS:  Lumbar hnp without myelopathy, Lumbar spondylosis, stenosis L4-5  POST-OPERATIVE DIAGNOSIS:  same  PROCEDURE:  Procedure(s): LUMBAR LAMINECTOMY/DECOMPRESSIONL4-5, microdisection, discectomy SURGEON:  Surgeon(s): Clydene Fake, MD Rolanda Lundborg Kritzer, MD-assist    ANESTHESIA:   general  EBL:  Total I/O In: 2400 [I.V.:2400] Out: -   BLOOD ADMINISTERED:none  DRAINS: none   SPECIMEN:  No Specimen  DICTATION: Patient quit onset of severe right leg pain back in March found to have a right-sided disc herniation and epidural injection was improved over the last few weeks developed severe left-sided pain to been in the emergency room was done showing the same the disc herniation centrally lumbar to the right pons and disc protrusion and towards the left side and in a probable synovial cyst that has occurred in the interim patella causing lateral recess stenosis the patient brought in for bilateral laminotomies and decompressive lamina centrally and discectomy  Pieroni from general anesthesia induced patient placed in prone position Wilson frame all pressure points padded. Patient prepped draped sterile fashion segments inject with 10 cc 1% lidocaine. With epinephrine. He was placed interspace x-rays attention needle was putting at the 5 spinous process incision was then made centered just below with needle was incision taken the fascia hemostasis obtained with Bovie cauterization fascia incised and subperiosteal dissection done over the 4 and 5 spinous process lamina out to the facet bilaterally cementing retractors placed markers placed the interspace at x-rays obtained confirming or positioning at L4-5. During the left side this mainly symptomatic sense high-speed drill was used to starting semi-hemi-laminotomy and decompressive laminotomy. An extensive medial facetectomy done and as we started removing  ligamentum flavum digital fluid drainage the sutures from the small synovial cyst. I removed ligament decompressing the central canal and lateral recess decompressing the nerve roots. We explored the epidural space and a very large disc bulge there fairly boggy that we left it intact at the moment and went to the contralateral side right side where a decompressive semi-hemilaminotomies done the same fashion appeared explored the epidural space there and we could not remove the dura nerve root medially 2 large disc herniation and needed. Since this is not the symptomatic side we did not open the disc space he went back to the left side protecting the dura and nerve root with a retractor incised the space and discectomy done pituitary rongeurs and curettes we were able to reach over found the disc and pullout we thought was the fragment from the right paramedian area. The space of the left and we were finished to the right side and sure enough we did decompress and remove the disc herniation and explore the area to the right side that did not open the annulus any further than the an interval that was oriented from the disc herniation there is no further protruding disc and we did mobilize the dura well. We did hemostasis we. About solution we did decompression central canal and the 4 and V nerve roots. Retractors removed fascia closed with 0 Vicryl interrupted sutures subcutaneous tissue with 020 3-0 Vicryl interrupted sutures skin closed benzoin Steri-Strips dressing was placed patient was placed back and right in spine position woken (and transferred to recovery room  PLAN OF CARE: Admit to inpatient   PATIENT DISPOSITION:  PACU - hemodynamically stable.

## 2011-12-06 NOTE — H&P (Signed)
See H& P.

## 2011-12-06 NOTE — Interval H&P Note (Signed)
History and Physical Interval Note:  12/06/2011 2:30 PM  Matthew [name]  has presented today for surgery, with the diagnosis of Lumbar hnp without myelopathy, Lumbar spondylosis  The various methods of treatment have been discussed with the patient and family. After consideration of risks, benefits and other options for treatment, the patient has consented to  Procedure(s) (LRB) with comments: LUMBAR LAMINECTOMY/DECOMPRESSION MICRODISCECTOMY 1 LEVEL (Bilateral) - Right L4-5 Laminectomy/Diskectomy/Left L4-5 Decompressive Laminectomy as a surgical intervention .  The patient's history has been reviewed, patient examined, no change in status, stable for surgery.  I have reviewed the patient's chart and labs.  Questions were answered to the patient's satisfaction.     [name] R

## 2011-12-06 NOTE — Anesthesia Preprocedure Evaluation (Signed)
Anesthesia Evaluation  Patient identified by MRN, date of birth, ID band Patient awake    Reviewed: Allergy & Precautions, H&P , NPO status , Patient's Chart, lab work & pertinent test results  Airway Mallampati: II  Neck ROM: full    Dental   Pulmonary asthma ,          Cardiovascular hypertension,     Neuro/Psych Anxiety    GI/Hepatic GERD-  ,  Endo/Other    Renal/GU      Musculoskeletal  (+) Arthritis -,   Abdominal   Peds  Hematology   Anesthesia Other Findings   Reproductive/Obstetrics                           Anesthesia Physical Anesthesia Plan  ASA: II  Anesthesia Plan: General   Post-op Pain Management:    Induction: Intravenous  Airway Management Planned: Oral ETT  Additional Equipment:   Intra-op Plan:   Post-operative Plan: Extubation in OR  Informed Consent: I have reviewed the patients History and Physical, chart, labs and discussed the procedure including the risks, benefits and alternatives for the proposed anesthesia with the patient or authorized representative who has indicated his/her understanding and acceptance.     Plan Discussed with: CRNA and Surgeon  Anesthesia Plan Comments:         Anesthesia Quick Evaluation

## 2011-12-06 NOTE — Preoperative (Signed)
Beta Blockers   Reason not to administer Beta Blockers:Not Applicable 

## 2011-12-06 NOTE — OR Nursing (Signed)
Patient anxious, waiting on surgery. States "something is not right". States that he takes ativan and thorazine four times a day under the care of his psychiatrist. He states he does not think he took those meds today. Dr. Chaney Malling informed and order received to give patient Midazolam.

## 2011-12-06 NOTE — Progress Notes (Signed)
Care of pt assumed by MA Jeneen Doutt RN 

## 2011-12-06 NOTE — Anesthesia Postprocedure Evaluation (Signed)
  Anesthesia Post-op Note  Patient: Matthew [name]  Procedure(s) Performed: Procedure(s) (LRB) with comments: LUMBAR LAMINECTOMY/DECOMPRESSION MICRODISCECTOMY 1 LEVEL (Bilateral) - Bilateral Lumbar Four-Five Laminectomy/Diskectomy  Patient Location: PACU  Anesthesia Type: General  Level of Consciousness: awake, alert , patient cooperative and responds to stimulation  Airway and Oxygen Therapy: Patient Spontanous Breathing and Patient connected to nasal cannula oxygen  Post-op Pain: none  Post-op Assessment: Post-op Vital signs reviewed and Patient's Cardiovascular Status Stable  Post-op Vital Signs: Reviewed and stable  Complications: No apparent anesthesia complications

## 2011-12-06 NOTE — Anesthesia Procedure Notes (Signed)
Procedure Name: Intubation Date/Time: 12/06/2011 2:49 PM Performed by: Marena Chancy Pre-anesthesia Checklist: Patient identified, Timeout performed, Emergency Drugs available, Suction available and Patient being monitored Patient Re-evaluated:Patient Re-evaluated prior to inductionOxygen Delivery Method: Circle system utilized Preoxygenation: Pre-oxygenation with 100% oxygen Intubation Type: IV induction Ventilation: Mask ventilation without difficulty and Oral airway inserted - appropriate to patient size Laryngoscope Size: Hyacinth Meeker and 2 Grade View: Grade II Tube type: Oral Tube size: 7.5 mm Number of attempts: 1 Placement Confirmation: ETT inserted through vocal cords under direct vision,  breath sounds checked- equal and bilateral and positive ETCO2 Secured at: 24 cm Tube secured with: Tape Dental Injury: Teeth and Oropharynx as per pre-operative assessment

## 2011-12-06 NOTE — Progress Notes (Signed)
ANTIBIOTIC CONSULT NOTE - INITIAL  Pharmacy Consult for Vancomycin  Indication: post-op prophylaxis  Allergies  Allergen Reactions  . Acetaminophen Nausea And Vomiting  . Amoxicillin     REACTION: unspecified/unknown  . Erythromycin     REACTION:Increases tegretol level  . Nsaids Other (See Comments)    REACTION: Cannot take due to kidney health  . Penicillins     REACTION: Unknown    Patient Measurements: Height: 6\' 4"  (193 cm) Weight: 190 lb (86.183 kg) IBW/kg (Calculated) : 86.8   Vital Signs: Temp: 97 F (36.1 C) (09/10 1839) Temp src: Oral (09/10 0810) BP: 181/81 mmHg (09/10 1839) Pulse Rate: 84  (09/10 1839) Intake/Output from previous day:   Intake/Output from this shift: Total I/O In: 2550 [I.V.:2550] Out: 50 [Blood:50]  Labs:  [facility] 12/06/11 0855  WBC 10.0  HGB 14.0  PLT 213  LABCREA --  CREATININE 1.44*   Estimated Creatinine Clearance: 67.3 ml/min (by C-G formula based on Cr of 1.44).  Medical History: Past Medical History  Diagnosis Date  . Anxiety   . Hypertension   . GERD (gastroesophageal reflux disease)   . Barrett esophagus   . Esophageal stricture   . Vitamin B12 deficiency   . Elevated serum creatinine   . Complication of anesthesia     unsure  . Asthma     "mild"  . Bronchitis   . Arthritis   . Anemia     hx of  . Neck pain, chronic   . Mood disorder     psychiatrist, Dr. Jennelle Human [phone]  . Agoraphobia    Assessment:   S/p lumbar surgery.  Penicillin allergy.   Vancomycin 1 gram IV given pre-op at 2:43 pm.   No drain;  For 1 dose of Vancomycin ~ 12 hrs post-op.  Goal of Therapy:  Vancomycin trough level 10-15 mcg/ml  Plan:    Will repeat Vancomycin 1 gram IV at 2am on 9/11.   No Rx follow-up needed.  Dennie Fetters, RPh Pager: [phone] 12/06/2011,6:59 PM

## 2011-12-06 NOTE — Transfer of Care (Signed)
Immediate Anesthesia Transfer of Care Note  Patient: Matthew [name]  Procedure(s) Performed: Procedure(s) (LRB) with comments: LUMBAR LAMINECTOMY/DECOMPRESSION MICRODISCECTOMY 1 LEVEL (Bilateral) - Bilateral Lumbar Four-Five Laminectomy/Diskectomy  Patient Location: PACU  Anesthesia Type: General  Level of Consciousness: sedated and patient cooperative  Airway & Oxygen Therapy: Patient Spontanous Breathing and Patient connected to nasal cannula oxygen  Post-op Assessment: Report given to PACU RN, Post -op Vital signs reviewed and stable and Patient moving all extremities X 4  Post vital signs: Reviewed and stable  Complications: No apparent anesthesia complications

## 2011-12-07 MED ORDER — CYCLOBENZAPRINE HCL 10 MG PO TABS: 10.0000 mg | ORAL_TABLET | Freq: Three times a day (TID) | ORAL | Status: AC | PRN

## 2011-12-07 MED ORDER — OXYCODONE HCL 10 MG PO TABS: 5.0000 mg | ORAL_TABLET | Freq: Four times a day (QID) | ORAL | Status: DC | PRN

## 2011-12-07 NOTE — Progress Notes (Signed)
Utilization review completed. Tavionna Grout, RN, BSN. 

## 2011-12-07 NOTE — Discharge Summary (Signed)
Physician Discharge Summary  Patient ID: Matthew [name] MRN: [id number] DOB/AGE: [birth year] 61 y.o.  Admit date: 12/06/2011 Discharge date: 12/07/2011  Admission Diagnoses:Lumbar hnp without myelopathy, Lumbar spondylosis, stenosis L4-5   Discharge Diagnoses: Lumbar hnp without myelopathy, Lumbar spondylosis, stenosis L4-5  Active Problems:  * No active hospital problems. *    Discharged Condition: good  Hospital Course: pt admitted for surgery below - post op pt doing well, less leg pain, ambulating, voiding, eating  Consults: None  Significant Diagnostic Studies: none Treatments: surgery: LUMBAR LAMINECTOMY/DECOMPRESSIONL4-5, microdisection, discectomy   Discharge Exam: Blood pressure 160/72, pulse 89, temperature 99.7 F (37.6 C), temperature source Oral, resp. rate 18, height 6\' 4"  (1.93 m), weight 86.183 kg (190 lb), SpO2 93.00%. Wound:c/d/i Disposition: home    Medication List     As of 12/07/2011  8:02 AM    TAKE these medications         amLODipine 10 MG tablet   Commonly known as: NORVASC   Take 10 mg by mouth daily.      aspirin EC 81 MG tablet   Take 81 mg by mouth daily.      carbamazepine 200 MG tablet   Commonly known as: TEGRETOL   Take 100 mg by mouth 4 (four) times daily.      chlorproMAZINE 100 MG tablet   Commonly known as: THORAZINE   Take 100 mg by mouth 4 (four) times daily.      cyanocobalamin 1000 MCG/ML injection   Commonly known as: (VITAMIN B-12)   Inject 1,000 mcg into the muscle every 30 (thirty) days.      cyclobenzaprine 10 MG tablet   Commonly known as: FLEXERIL   Take 1 tablet (10 mg total) by mouth 3 (three) times daily as needed for muscle spasms.      esomeprazole 40 MG capsule   Commonly known as: NEXIUM   Take 40 mg by mouth 2 (two) times daily.      LORazepam 1 MG tablet   Commonly known as: ATIVAN   Take 1 mg by mouth 4 (four) times daily. BRAND ONLY      Oxycodone HCl 10 MG Tabs   Take 0.5 tablets (5 mg  total) by mouth every 6 (six) hours as needed. pain      trihexyphenidyl 2 MG tablet   Commonly known as: ARTANE   Take 2 mg by mouth 2 (two) times daily with a meal.      valsartan 80 MG tablet   Commonly known as: DIOVAN   Take 80 mg by mouth daily.         SignedClydene Fake, MD 12/07/2011, 8:02 AM

## 2011-12-07 NOTE — Progress Notes (Signed)
Pt and mother given D/C instructions with Rx's. Both Pt and mother verbalized understanding of teaching. Pt D/C'd home with mother via wheelchair per MD order. Rema Fendt, RN

## 2011-12-08 ENCOUNTER — Encounter (HOSPITAL_COMMUNITY): Payer: Self-pay | Admitting: Neurosurgery

## 2012-01-31 ENCOUNTER — Ambulatory Visit: Payer: Medicare Other | Attending: Neurosurgery | Admitting: Physical Therapy

## 2012-01-31 DIAGNOSIS — R5381 Other malaise: Secondary | ICD-10-CM | POA: Insufficient documentation

## 2012-01-31 DIAGNOSIS — M545 Low back pain, unspecified: Secondary | ICD-10-CM | POA: Insufficient documentation

## 2012-01-31 DIAGNOSIS — IMO0001 Reserved for inherently not codable concepts without codable children: Secondary | ICD-10-CM | POA: Insufficient documentation

## 2012-01-31 DIAGNOSIS — R293 Abnormal posture: Secondary | ICD-10-CM | POA: Insufficient documentation

## 2012-01-31 DIAGNOSIS — M2560 Stiffness of unspecified joint, not elsewhere classified: Secondary | ICD-10-CM | POA: Insufficient documentation

## 2012-02-01 ENCOUNTER — Ambulatory Visit: Payer: Medicare Other | Admitting: *Deleted

## 2012-02-06 ENCOUNTER — Ambulatory Visit: Payer: Medicare Other | Admitting: *Deleted

## 2012-02-08 ENCOUNTER — Ambulatory Visit: Payer: Medicare Other | Admitting: *Deleted

## 2012-02-13 ENCOUNTER — Ambulatory Visit: Payer: Medicare Other | Admitting: *Deleted

## 2012-02-15 ENCOUNTER — Ambulatory Visit: Payer: Medicare Other | Admitting: *Deleted

## 2012-02-21 ENCOUNTER — Ambulatory Visit: Payer: Medicare Other | Admitting: *Deleted

## 2012-02-27 ENCOUNTER — Ambulatory Visit: Payer: Medicare Other | Attending: Neurosurgery | Admitting: *Deleted

## 2012-02-27 DIAGNOSIS — R293 Abnormal posture: Secondary | ICD-10-CM | POA: Insufficient documentation

## 2012-02-27 DIAGNOSIS — R5381 Other malaise: Secondary | ICD-10-CM | POA: Insufficient documentation

## 2012-02-27 DIAGNOSIS — M545 Low back pain, unspecified: Secondary | ICD-10-CM | POA: Insufficient documentation

## 2012-02-27 DIAGNOSIS — M2560 Stiffness of unspecified joint, not elsewhere classified: Secondary | ICD-10-CM | POA: Insufficient documentation

## 2012-02-27 DIAGNOSIS — IMO0001 Reserved for inherently not codable concepts without codable children: Secondary | ICD-10-CM | POA: Insufficient documentation

## 2012-02-29 ENCOUNTER — Ambulatory Visit: Payer: Medicare Other | Admitting: *Deleted

## 2012-03-05 ENCOUNTER — Ambulatory Visit: Payer: Medicare Other | Admitting: *Deleted

## 2012-03-07 ENCOUNTER — Ambulatory Visit: Payer: Medicare Other | Admitting: Physical Therapy

## 2012-03-12 ENCOUNTER — Ambulatory Visit: Payer: Medicare Other | Admitting: *Deleted

## 2012-03-14 ENCOUNTER — Ambulatory Visit: Payer: Medicare Other | Admitting: *Deleted

## 2012-03-19 ENCOUNTER — Ambulatory Visit: Payer: Medicare Other | Admitting: *Deleted

## 2012-04-09 ENCOUNTER — Ambulatory Visit: Payer: Medicare Other | Attending: Neurosurgery | Admitting: *Deleted

## 2012-04-09 DIAGNOSIS — M2560 Stiffness of unspecified joint, not elsewhere classified: Secondary | ICD-10-CM | POA: Insufficient documentation

## 2012-04-09 DIAGNOSIS — M545 Low back pain, unspecified: Secondary | ICD-10-CM | POA: Insufficient documentation

## 2012-04-09 DIAGNOSIS — R293 Abnormal posture: Secondary | ICD-10-CM | POA: Insufficient documentation

## 2012-04-09 DIAGNOSIS — R5381 Other malaise: Secondary | ICD-10-CM | POA: Insufficient documentation

## 2012-04-09 DIAGNOSIS — IMO0001 Reserved for inherently not codable concepts without codable children: Secondary | ICD-10-CM | POA: Insufficient documentation

## 2012-04-25 ENCOUNTER — Telehealth: Payer: Self-pay | Admitting: Internal Medicine

## 2012-04-25 NOTE — Telephone Encounter (Signed)
Patient states that he got a note from his insurance that a prior auth will need to be done for Nexium. He just got #60. I have advised that he should let the pharmacy know at least 1 week prior to running out of medication that he needs a refill. They will then contact me if authorization is needed. He verbalizes understanding.

## 2012-05-04 ENCOUNTER — Telehealth: Payer: Self-pay | Admitting: *Deleted

## 2012-05-04 NOTE — Telephone Encounter (Signed)
Patient's Nexium twice daily has been approved from insurance until 03/27/13. Spoke with Zella Ball (phone [phone]).

## 2012-10-09 ENCOUNTER — Other Ambulatory Visit: Payer: Self-pay | Admitting: Internal Medicine

## 2012-11-03 ENCOUNTER — Emergency Department (HOSPITAL_COMMUNITY): Payer: Medicare Other

## 2012-11-03 ENCOUNTER — Emergency Department (HOSPITAL_COMMUNITY)
Admission: EM | Admit: 2012-11-03 | Discharge: 2012-11-03 | Disposition: A | Discharge status: Home / Self Care | Payer: Medicare Other | Attending: Emergency Medicine | Admitting: Emergency Medicine

## 2012-11-03 ENCOUNTER — Encounter (HOSPITAL_COMMUNITY): Payer: Self-pay | Admitting: *Deleted

## 2012-11-03 DIAGNOSIS — M129 Arthropathy, unspecified: Secondary | ICD-10-CM | POA: Insufficient documentation

## 2012-11-03 DIAGNOSIS — J45909 Unspecified asthma, uncomplicated: Secondary | ICD-10-CM | POA: Insufficient documentation

## 2012-11-03 DIAGNOSIS — M542 Cervicalgia: Secondary | ICD-10-CM | POA: Insufficient documentation

## 2012-11-03 DIAGNOSIS — Z880 Allergy status to penicillin: Secondary | ICD-10-CM | POA: Insufficient documentation

## 2012-11-03 DIAGNOSIS — F411 Generalized anxiety disorder: Secondary | ICD-10-CM | POA: Insufficient documentation

## 2012-11-03 DIAGNOSIS — M549 Dorsalgia, unspecified: Secondary | ICD-10-CM | POA: Insufficient documentation

## 2012-11-03 DIAGNOSIS — K219 Gastro-esophageal reflux disease without esophagitis: Secondary | ICD-10-CM | POA: Insufficient documentation

## 2012-11-03 DIAGNOSIS — Z79899 Other long term (current) drug therapy: Secondary | ICD-10-CM | POA: Insufficient documentation

## 2012-11-03 DIAGNOSIS — E538 Deficiency of other specified B group vitamins: Secondary | ICD-10-CM | POA: Insufficient documentation

## 2012-11-03 DIAGNOSIS — G8929 Other chronic pain: Secondary | ICD-10-CM | POA: Insufficient documentation

## 2012-11-03 DIAGNOSIS — Z8659 Personal history of other mental and behavioral disorders: Secondary | ICD-10-CM | POA: Insufficient documentation

## 2012-11-03 DIAGNOSIS — I10 Essential (primary) hypertension: Secondary | ICD-10-CM | POA: Insufficient documentation

## 2012-11-03 DIAGNOSIS — R52 Pain, unspecified: Secondary | ICD-10-CM | POA: Insufficient documentation

## 2012-11-03 DIAGNOSIS — Z87891 Personal history of nicotine dependence: Secondary | ICD-10-CM | POA: Insufficient documentation

## 2012-11-03 DIAGNOSIS — R109 Unspecified abdominal pain: Secondary | ICD-10-CM | POA: Insufficient documentation

## 2012-11-03 DIAGNOSIS — M25551 Pain in right hip: Secondary | ICD-10-CM

## 2012-11-03 DIAGNOSIS — F39 Unspecified mood [affective] disorder: Secondary | ICD-10-CM | POA: Insufficient documentation

## 2012-11-03 DIAGNOSIS — Z8719 Personal history of other diseases of the digestive system: Secondary | ICD-10-CM | POA: Insufficient documentation

## 2012-11-03 DIAGNOSIS — M25559 Pain in unspecified hip: Secondary | ICD-10-CM | POA: Insufficient documentation

## 2012-11-03 DIAGNOSIS — Z7982 Long term (current) use of aspirin: Secondary | ICD-10-CM | POA: Insufficient documentation

## 2012-11-03 MED ORDER — TRAMADOL HCL 50 MG PO TABS: 50.0000 mg | ORAL_TABLET | Freq: Four times a day (QID) | ORAL | Status: DC | PRN

## 2012-11-03 NOTE — ED Notes (Signed)
Pt c/o right hip pain for the past two months becoming worse over the past 24 hours, denies any injury, pt ambulatory to examine  Room without any problems,

## 2012-11-03 NOTE — ED Provider Notes (Signed)
Medical screening examination/treatment/procedure(s) were performed by non-physician practitioner and as supervising physician I was immediately available for consultation/collaboration. Dekota Kirlin, MD, FACEP   Rayder Sullenger L Christophor Eick, MD 11/03/12 1551 

## 2012-11-03 NOTE — ED Provider Notes (Signed)
CSN: [id number]     Arrival date & time 11/03/12  0907 History     First MD Initiated Contact with Patient 11/03/12 [phone]     Chief Complaint  Patient presents with  . Hip Pain   (Consider location/radiation/quality/duration/timing/severity/associated sxs/prior Treatment) HPI Comments: Matthew [name] is a 62 y.o. Male presenting with non radiating sharp right hip and pelvis pain for the past several months which is worsened on days when he walks a lot, and seems to be most severe for the first few steps after sitting or lying.  He denies any injury or falls and has no weakness or numbness in his right lower extremity, denies low back pain although he does have a history of prior lumbar laminectomy one year ago by Dr. Phoebe Perch.  He has taken no medicines for this pain.       The history is provided by the patient.    Past Medical History  Diagnosis Date  . Anxiety   . Hypertension   . GERD (gastroesophageal reflux disease)   . Barrett esophagus   . Esophageal stricture   . Vitamin B12 deficiency   . Elevated serum creatinine   . Complication of anesthesia     unsure  . Asthma     "mild"  . Bronchitis   . Arthritis   . Anemia     hx of  . Neck pain, chronic   . Mood disorder     psychiatrist, Dr. Jennelle Human [phone]  . Agoraphobia    Past Surgical History  Procedure Laterality Date  . Upper gastrointestinal endoscopy    . Chest tube insertion      age 27  . Fracture surgery      left leg  . Wisdom tooth extraction    . Lumbar laminectomy/decompression microdiscectomy  12/06/2011    Procedure: LUMBAR LAMINECTOMY/DECOMPRESSION MICRODISCECTOMY 1 LEVEL;  Surgeon: Clydene Fake, MD;  Location: [facility];  Service: Neurosurgery;  Laterality: Bilateral;  Bilateral Lumbar Four-Five Laminectomy/Diskectomy   Family History  Problem Relation Age of Onset  . Colon cancer Neg Hx   . Diabetes Brother    History  Substance Use Topics  . Smoking status: Former Games developer  .  Smokeless tobacco: Not on file  . Alcohol Use: No    Review of Systems  Constitutional: Negative for fever.  Respiratory: Negative for shortness of breath.   Cardiovascular: Negative for chest pain and leg swelling.  Gastrointestinal: Negative for abdominal pain, constipation and abdominal distention.  Genitourinary: Negative for dysuria, urgency, frequency, flank pain and difficulty urinating.  Musculoskeletal: Positive for back pain. Negative for joint swelling and gait problem.  Skin: Negative for rash.  Neurological: Negative for weakness and numbness.    Allergies  Acetaminophen; Amoxicillin; Erythromycin; Nsaids; and Penicillins  Home Medications   Current Outpatient Rx  Name  Route  Sig  Dispense  Refill  . amLODipine (NORVASC) 10 MG tablet   Oral   Take 10 mg by mouth daily.           Marland Kitchen aspirin EC 81 MG tablet   Oral   Take 81 mg by mouth daily.         . carbamazepine (TEGRETOL) 200 MG tablet   Oral   Take 100 mg by mouth 4 (four) times daily.         . chlorproMAZINE (THORAZINE) 100 MG tablet   Oral   Take 100 mg by mouth 4 (four) times daily.           Marland Kitchen  cyanocobalamin (,VITAMIN B-12,) 1000 MCG/ML injection   Intramuscular   Inject 1,000 mcg into the muscle every 30 (thirty) days.           Marland Kitchen esomeprazole (NEXIUM) 40 MG capsule   Oral   Take 40 mg by mouth daily before breakfast.         . LORazepam (ATIVAN) 1 MG tablet   Oral   Take 1 mg by mouth 4 (four) times daily. BRAND ONLY         . losartan (COZAAR) 100 MG tablet   Oral   Take 100 mg by mouth daily.         . Oxycodone HCl 10 MG TABS   Oral   Take 0.5 tablets (5 mg total) by mouth every 6 (six) hours as needed. pain         . trihexyphenidyl (ARTANE) 2 MG tablet   Oral   Take 2 mg by mouth 2 (two) times daily with a meal.            BP 143/80  Pulse 70  Temp(Src) 97.8 F (36.6 C) (Oral)  Resp 18  Ht 6\' 4"  (1.93 m)  Wt 197 lb (89.359 kg)  BMI 23.99 kg/m2   SpO2 100% Physical Exam  Nursing note and vitals reviewed. Constitutional: He appears well-developed and well-nourished.  HENT:  Head: Normocephalic.  Eyes: Conjunctivae are normal.  Neck: Normal range of motion. Neck supple.  Cardiovascular: Normal rate and intact distal pulses.   Pedal pulses normal.  Pulmonary/Chest: Effort normal.  Abdominal: Soft. Bowel sounds are normal. He exhibits no distension and no mass.  Musculoskeletal: Normal range of motion. He exhibits no edema and no tenderness.       Lumbar back: He exhibits no tenderness, no swelling, no edema and no spasm.  No pain with palpation of right hip or buttocks, or with ROM.  He states it only hurts when he stands.  FROM of right hip without pain,  No decreased ROM.  Neurological: He is alert. He has normal strength. He displays no atrophy and no tremor. No sensory deficit. Gait normal.  Reflex Scores:      Patellar reflexes are 2+ on the right side and 2+ on the left side.      Achilles reflexes are 2+ on the right side and 2+ on the left side. No strength deficit noted in hip and knee flexor and extensor muscle groups.  Ankle flexion and extension intact.  Skin: Skin is warm and dry.  Psychiatric: He has a normal mood and affect.    ED Course   Procedures (including critical care time)  Labs Reviewed - No data to display Dg Hip Complete Right  11/03/2012   *RADIOLOGY REPORT*  Clinical Data: Right hip pain, no injury  RIGHT HIP - COMPLETE 2+ VIEW  Comparison:  None.  Findings:  There is no evidence of hip fracture or dislocation. There is no evidence of arthropathy or other focal bone abnormality.  IMPRESSION: Negative.   Original Report Authenticated By: Janeece Riggers, M.D.   1. Hip pain, acute, right     MDM  Patients labs and/or radiological studies were viewed and considered during the medical decision making and disposition process.  Pt was prescribed tramadol,  Encouraged decreased walking (he states he often  walks for miles daily),  Stay active without worsening pain.  F/u with pcp if not improved over the next week.  Heat pad also suggested.    Burgess Amor,  PA-C 11/03/12 1549

## 2012-12-06 ENCOUNTER — Telehealth: Payer: Self-pay | Admitting: Internal Medicine

## 2012-12-06 NOTE — Telephone Encounter (Signed)
Spoke with patient and told him he is due for recall EGD 11/2012. He states he is doing physical therapy right now and wants to schedule with [place] after this.

## 2012-12-10 ENCOUNTER — Ambulatory Visit: Payer: Medicare Other | Attending: Neurosurgery | Admitting: Physical Therapy

## 2012-12-10 DIAGNOSIS — IMO0001 Reserved for inherently not codable concepts without codable children: Secondary | ICD-10-CM | POA: Insufficient documentation

## 2012-12-10 DIAGNOSIS — M545 Low back pain, unspecified: Secondary | ICD-10-CM | POA: Insufficient documentation

## 2012-12-10 DIAGNOSIS — M25559 Pain in unspecified hip: Secondary | ICD-10-CM | POA: Insufficient documentation

## 2012-12-10 DIAGNOSIS — R5381 Other malaise: Secondary | ICD-10-CM | POA: Insufficient documentation

## 2012-12-11 ENCOUNTER — Encounter: Payer: Self-pay | Admitting: Internal Medicine

## 2012-12-12 ENCOUNTER — Ambulatory Visit: Payer: Medicare Other | Admitting: Physical Therapy

## 2012-12-17 ENCOUNTER — Other Ambulatory Visit: Payer: Self-pay | Admitting: Internal Medicine

## 2012-12-17 ENCOUNTER — Ambulatory Visit: Payer: Medicare Other | Admitting: Physical Therapy

## 2012-12-18 ENCOUNTER — Other Ambulatory Visit: Payer: Self-pay | Admitting: Internal Medicine

## 2012-12-19 ENCOUNTER — Ambulatory Visit: Payer: Medicare Other | Admitting: Physical Therapy

## 2012-12-24 ENCOUNTER — Ambulatory Visit: Payer: Medicare Other

## 2012-12-26 ENCOUNTER — Ambulatory Visit: Payer: Medicare Other | Attending: Neurosurgery

## 2012-12-26 DIAGNOSIS — IMO0001 Reserved for inherently not codable concepts without codable children: Secondary | ICD-10-CM | POA: Insufficient documentation

## 2012-12-26 DIAGNOSIS — M25559 Pain in unspecified hip: Secondary | ICD-10-CM | POA: Insufficient documentation

## 2012-12-26 DIAGNOSIS — M545 Low back pain, unspecified: Secondary | ICD-10-CM | POA: Insufficient documentation

## 2012-12-26 DIAGNOSIS — R5381 Other malaise: Secondary | ICD-10-CM | POA: Insufficient documentation

## 2012-12-27 ENCOUNTER — Encounter: Payer: Self-pay | Admitting: Internal Medicine

## 2012-12-31 ENCOUNTER — Ambulatory Visit: Payer: Medicare Other

## 2013-01-02 ENCOUNTER — Ambulatory Visit: Payer: Medicare Other

## 2013-01-07 ENCOUNTER — Ambulatory Visit: Payer: Medicare Other | Admitting: Physical Therapy

## 2013-01-09 ENCOUNTER — Encounter: Payer: Medicare Other | Admitting: Physical Therapy

## 2013-01-10 ENCOUNTER — Ambulatory Visit: Payer: Medicare Other | Admitting: *Deleted

## 2013-01-14 ENCOUNTER — Ambulatory Visit: Payer: Medicare Other | Admitting: Physical Therapy

## 2013-01-16 ENCOUNTER — Ambulatory Visit: Payer: Medicare Other | Admitting: Physical Therapy

## 2013-01-29 ENCOUNTER — Ambulatory Visit: Payer: Medicare Other | Attending: Neurosurgery | Admitting: *Deleted

## 2013-01-29 DIAGNOSIS — M545 Low back pain, unspecified: Secondary | ICD-10-CM | POA: Insufficient documentation

## 2013-01-29 DIAGNOSIS — R5381 Other malaise: Secondary | ICD-10-CM | POA: Insufficient documentation

## 2013-01-29 DIAGNOSIS — IMO0001 Reserved for inherently not codable concepts without codable children: Secondary | ICD-10-CM | POA: Insufficient documentation

## 2013-01-29 DIAGNOSIS — M25559 Pain in unspecified hip: Secondary | ICD-10-CM | POA: Insufficient documentation

## 2013-01-31 ENCOUNTER — Ambulatory Visit: Payer: Medicare Other | Admitting: *Deleted

## 2013-02-05 ENCOUNTER — Ambulatory Visit: Payer: Medicare Other | Admitting: Physical Therapy

## 2013-02-07 ENCOUNTER — Ambulatory Visit: Payer: Medicare Other | Admitting: Physical Therapy

## 2013-03-01 ENCOUNTER — Ambulatory Visit (AMBULATORY_SURGERY_CENTER): Payer: Self-pay

## 2013-03-01 VITALS — Ht 76.0 in | Wt 200.1 lb

## 2013-03-01 DIAGNOSIS — K2270 Barrett's esophagus without dysplasia: Secondary | ICD-10-CM

## 2013-03-11 ENCOUNTER — Telehealth: Payer: Self-pay | Admitting: Internal Medicine

## 2013-03-11 NOTE — Telephone Encounter (Signed)
Spoke with patient, explained to him that he will be able to speak with Dr.Brodie before any sedation is given so he can let her know that he wants moderate sedation. Also he will need new RX for nexium so the pharmacy told him to let Dr.Brodie know this on Friday also.  He verbally understands. Note placed under appointments that patient request moderate sedation.

## 2013-03-15 ENCOUNTER — Ambulatory Visit (AMBULATORY_SURGERY_CENTER): Payer: Medicaid Other | Admitting: Internal Medicine

## 2013-03-15 ENCOUNTER — Encounter: Payer: Self-pay | Admitting: Internal Medicine

## 2013-03-15 VITALS — BP 132/80 | HR 64 | Temp 97.5°F | Resp 16 | Ht 76.0 in | Wt 200.0 lb

## 2013-03-15 DIAGNOSIS — K2270 Barrett's esophagus without dysplasia: Secondary | ICD-10-CM

## 2013-03-15 DIAGNOSIS — K219 Gastro-esophageal reflux disease without esophagitis: Secondary | ICD-10-CM

## 2013-03-15 HISTORY — PX: UPPER GASTROINTESTINAL ENDOSCOPY: SHX188

## 2013-03-15 MED ORDER — ESOMEPRAZOLE MAGNESIUM 40 MG PO CPDR: 40.0000 mg | DELAYED_RELEASE_CAPSULE | Freq: Two times a day (BID) | ORAL | Status: DC

## 2013-03-15 MED ORDER — SODIUM CHLORIDE 0.9 % IV SOLN: 500.0000 mL | INTRAVENOUS | Status: DC

## 2013-03-15 NOTE — Progress Notes (Signed)
Report to pacu rn, vss bbs=clear 

## 2013-03-15 NOTE — Progress Notes (Signed)
Dr Juanda Chance and crna informed of pt wanting same sedation he had for last procedure which was 2012 which was versed and fentanyl

## 2013-03-15 NOTE — Op Note (Signed)
[facility] [address] [place], [address]   ENDOSCOPY PROCEDURE REPORT  PATIENT: Matthew [name], Matthew [name]  MR#: [id number] BIRTHDATE: 10-03-1950 , 62  yrs. old GENDER: Male ENDOSCOPIST: Hart Carwin, MD REFERRED BY:  Joette Catching, M.D. PROCEDURE DATE:  03/15/2013 PROCEDURE:  EGD w/ biopsy ASA CLASS:     Class II INDICATIONS:  history of Barrett's esophagus.   Barrett's esophagus in 2005.  No Barrett's in 2008.  Barrett's esophagus in 2010 and again in September 2012.  Symptoms of reflux controlled on Nexium 40 mg twice a day. MEDICATIONS: MAC sedation, administered by CRNA and Propofol (Diprivan) 130 mg IV TOPICAL ANESTHETIC: Cetacaine Spray  DESCRIPTION OF PROCEDURE: After the risks benefits and alternatives of the procedure were thoroughly explained, informed consent was obtained.  The LB [id number] endoscope was introduced through the mouth and advanced to the second portion of the duodenum. Without limitations.  The instrument was slowly withdrawn as the mucosa was fully examined.      Esophagus,:  esophageal mucosa was normal in proximal mid and distal esophagus. The Z line was irregular multiple biopsies were taken to rule out dysplasia . There was small reducible hiatal hernia measuring about 2 cm. There was no stricture or active esophagitis Stomach: Gastric mucosa appeared normal including rugal folds and gastric antrum. Retroflexion of the endoscope revealed normal fundus and cardia Duodenum duodenal bulb and descending duodenum was normal[ The scope was then withdrawn from the patient and the procedure completed.  COMPLICATIONS: There were no complications. ENDOSCOPIC IMPRESSION:  Barrett's esophagus status post biopsies small reducible hiatal hernia  RECOMMENDATIONS: 1.  Anti-reflux regimen to be follow 2.  Await biopsy results 3.  Continue PPI ,Nexium 40 mg twice a day, will preauthorization  REPEAT EXAM: for EGD pending  biopsy results.  eSigned:  Hart Carwin, MD 03/15/2013 10:12 AM   CC:  PATIENT NAME:  Cowan, Pilar MR#: [id number]

## 2013-03-15 NOTE — Patient Instructions (Signed)
Reflux, Hiatal Hernia information sheet given.  Reviewed discharge instructions with patient and his mother.  YOU HAD AN ENDOSCOPIC PROCEDURE TODAY AT THE [facility]: Refer to the procedure report that was given to you for any specific questions about what was found during the examination.  If the procedure report does not answer your questions, please call your gastroenterologist to clarify.  If you requested that your care partner not be given the details of your procedure findings, then the procedure report has been included in a sealed envelope for you to review at your convenience later.  YOU SHOULD EXPECT: Some feelings of bloating in the abdomen. Passage of more gas than usual.  Walking can help get rid of the air that was put into your GI tract during the procedure and reduce the bloating. If you had a lower endoscopy (such as a colonoscopy or flexible sigmoidoscopy) you may notice spotting of blood in your stool or on the toilet paper. If you underwent a bowel prep for your procedure, then you may not have a normal bowel movement for a few days.  DIET: Your first meal following the procedure should be a light meal and then it is ok to progress to your normal diet.  A half-sandwich or bowl of soup is an example of a good first meal.  Heavy or fried foods are harder to digest and may make you feel nauseous or bloated.  Likewise meals heavy in dairy and vegetables can cause extra gas to form and this can also increase the bloating.  Drink plenty of fluids but you should avoid alcoholic beverages for 24 hours.  ACTIVITY: Your care partner should take you home directly after the procedure.  You should plan to take it easy, moving slowly for the rest of the day.  You can resume normal activity the day after the procedure however you should NOT DRIVE or use heavy machinery for 24 hours (because of the sedation medicines used during the test).    SYMPTOMS TO REPORT IMMEDIATELY: A  gastroenterologist can be reached at any hour.  During normal business hours, 8:30 AM to 5:00 PM Monday through Friday, call [phone].  After hours and on weekends, please call the GI answering service at [phone] who will take a message and have the physician on call contact you.    Following upper endoscopy (EGD)  Vomiting of blood or coffee ground material  New chest pain or pain under the shoulder blades  Painful or persistently difficult swallowing  New shortness of breath  Fever of 100F or higher  Black, tarry-looking stools  FOLLOW UP: If any biopsies were taken you will be contacted by phone or by letter within the next 1-3 weeks.  Call your gastroenterologist if you have not heard about the biopsies in 3 weeks.  Our staff will call the home number listed on your records the next business day following your procedure to check on you and address any questions or concerns that you may have at that time regarding the information given to you following your procedure. This is a courtesy call and so if there is no answer at the home number and we have not heard from you through the emergency physician on call, we will assume that you have returned to your regular daily activities without incident.  SIGNATURES/CONFIDENTIALITY: You and/or your care partner have signed paperwork which will be entered into your electronic medical record.  These signatures attest to the fact that that  the information above on your After Visit Summary has been reviewed and is understood.  Full responsibility of the confidentiality of this discharge information lies with you and/or your care-partner.

## 2013-03-15 NOTE — Progress Notes (Signed)
Nexium RX 40 mg, BID, QTY 190 with one refill given to patient per MD.

## 2013-03-15 NOTE — Progress Notes (Signed)
Called to room to assist during endoscopic procedure.  Patient ID and intended procedure confirmed with present staff. Received instructions for my participation in the procedure from the performing physician.  

## 2013-03-18 ENCOUNTER — Telehealth: Payer: Self-pay | Admitting: *Deleted

## 2013-03-18 NOTE — Telephone Encounter (Signed)
  Follow up Call-  Call back number 03/15/2013 12/21/2010  Post procedure Call Back phone  # [phone]  Permission to leave phone message Yes -     Patient questions:  Do you have a fever, pain , or abdominal swelling? no Pain Score  0 *  Have you tolerated food without any problems? yes  Have you been able to return to your normal activities? yes  Do you have any questions about your discharge instructions: Diet   no Medications  no Follow up visit  no  Do you have questions or concerns about your Care? no  Actions: * If pain score is 4 or above: No action needed, pain <4.

## 2013-03-22 ENCOUNTER — Emergency Department (HOSPITAL_COMMUNITY)
Admission: EM | Admit: 2013-03-22 | Discharge: 2013-03-22 | Disposition: A | Discharge status: Home / Self Care | Payer: Medicare Other | Attending: Emergency Medicine | Admitting: Emergency Medicine

## 2013-03-22 ENCOUNTER — Emergency Department (HOSPITAL_COMMUNITY): Payer: Medicare Other

## 2013-03-22 ENCOUNTER — Encounter (HOSPITAL_COMMUNITY): Payer: Self-pay | Admitting: Emergency Medicine

## 2013-03-22 DIAGNOSIS — K219 Gastro-esophageal reflux disease without esophagitis: Secondary | ICD-10-CM | POA: Insufficient documentation

## 2013-03-22 DIAGNOSIS — Z87891 Personal history of nicotine dependence: Secondary | ICD-10-CM | POA: Insufficient documentation

## 2013-03-22 DIAGNOSIS — R52 Pain, unspecified: Secondary | ICD-10-CM | POA: Insufficient documentation

## 2013-03-22 DIAGNOSIS — Z7982 Long term (current) use of aspirin: Secondary | ICD-10-CM | POA: Insufficient documentation

## 2013-03-22 DIAGNOSIS — F411 Generalized anxiety disorder: Secondary | ICD-10-CM | POA: Insufficient documentation

## 2013-03-22 DIAGNOSIS — M129 Arthropathy, unspecified: Secondary | ICD-10-CM | POA: Insufficient documentation

## 2013-03-22 DIAGNOSIS — Z872 Personal history of diseases of the skin and subcutaneous tissue: Secondary | ICD-10-CM | POA: Insufficient documentation

## 2013-03-22 DIAGNOSIS — E538 Deficiency of other specified B group vitamins: Secondary | ICD-10-CM | POA: Insufficient documentation

## 2013-03-22 DIAGNOSIS — Z79899 Other long term (current) drug therapy: Secondary | ICD-10-CM | POA: Insufficient documentation

## 2013-03-22 DIAGNOSIS — N189 Chronic kidney disease, unspecified: Secondary | ICD-10-CM | POA: Insufficient documentation

## 2013-03-22 DIAGNOSIS — R531 Weakness: Secondary | ICD-10-CM

## 2013-03-22 DIAGNOSIS — J45909 Unspecified asthma, uncomplicated: Secondary | ICD-10-CM | POA: Insufficient documentation

## 2013-03-22 DIAGNOSIS — Z862 Personal history of diseases of the blood and blood-forming organs and certain disorders involving the immune mechanism: Secondary | ICD-10-CM | POA: Insufficient documentation

## 2013-03-22 DIAGNOSIS — G8929 Other chronic pain: Secondary | ICD-10-CM | POA: Insufficient documentation

## 2013-03-22 DIAGNOSIS — I10 Essential (primary) hypertension: Secondary | ICD-10-CM | POA: Insufficient documentation

## 2013-03-22 DIAGNOSIS — Z880 Allergy status to penicillin: Secondary | ICD-10-CM | POA: Insufficient documentation

## 2013-03-22 LAB — BASIC METABOLIC PANEL
BUN: 20 mg/dL (ref 6–23)
CO2: 25 mEq/L (ref 19–32)
Calcium: 8.6 mg/dL (ref 8.4–10.5)
Chloride: 98 mEq/L (ref 96–112)
Creatinine, Ser: 2.15 mg/dL — ABNORMAL HIGH (ref 0.50–1.35)
GFR calc Af Amer: 37 mL/min — ABNORMAL LOW (ref >90)
GFR calc non Af Amer: 32 mL/min — ABNORMAL LOW (ref >90)
Glucose, Bld: 107 mg/dL — ABNORMAL HIGH (ref 70–99)
Potassium: 3.9 mEq/L (ref 3.5–5.1)
Sodium: 134 mEq/L — ABNORMAL LOW (ref 135–145)

## 2013-03-22 LAB — CBC WITH DIFFERENTIAL/PLATELET
Basophils Absolute: 0 10*3/uL (ref 0.0–0.1)
Basophils Relative: 0 % (ref 0–1)
Eosinophils Absolute: 0 10*3/uL (ref 0.0–0.7)
Eosinophils Relative: 0 % (ref 0–5)
HCT: 34.4 % — ABNORMAL LOW (ref 39.0–52.0)
Hemoglobin: 11.7 g/dL — ABNORMAL LOW (ref 13.0–17.0)
Lymphocytes Relative: 16 % (ref 12–46)
Lymphs Abs: 0.7 10*3/uL (ref 0.7–4.0)
MCH: 29.9 pg (ref 26.0–34.0)
MCHC: 34 g/dL (ref 30.0–36.0)
MCV: 88 fL (ref 78.0–100.0)
Monocytes Absolute: 0.6 10*3/uL (ref 0.1–1.0)
Monocytes Relative: 14 % — ABNORMAL HIGH (ref 3–12)
Neutro Abs: 2.8 10*3/uL (ref 1.7–7.7)
Neutrophils Relative %: 69 % (ref 43–77)
Platelets: 132 10*3/uL — ABNORMAL LOW (ref 150–400)
RBC: 3.91 MIL/uL — ABNORMAL LOW (ref 4.22–5.81)
RDW: 13.1 % (ref 11.5–15.5)
WBC: 4.1 10*3/uL (ref 4.0–10.5)

## 2013-03-22 LAB — URINALYSIS, ROUTINE W REFLEX MICROSCOPIC
Bilirubin Urine: NEGATIVE
Glucose, UA: NEGATIVE mg/dL
Hgb urine dipstick: NEGATIVE
Ketones, ur: NEGATIVE mg/dL
Leukocytes, UA: NEGATIVE
Nitrite: NEGATIVE
Protein, ur: NEGATIVE mg/dL
Specific Gravity, Urine: <1.005 — ABNORMAL LOW (ref 1.005–1.030)
Urobilinogen, UA: 0.2 mg/dL (ref 0.0–1.0)
pH: 6 (ref 5.0–8.0)

## 2013-03-22 MED ORDER — SODIUM CHLORIDE 0.9 % IV BOLUS (SEPSIS)
1000.0000 mL | Freq: Once | INTRAVENOUS | Status: AC
  Administered 2013-03-22: 1000 mL via INTRAVENOUS

## 2013-03-22 MED ORDER — ACETAMINOPHEN 500 MG PO TABS
1000.0000 mg | ORAL_TABLET | Freq: Once | ORAL | Status: AC
  Administered 2013-03-22: 1000 mg via ORAL
  Filled 2013-03-22: qty 2

## 2013-03-22 NOTE — ED Notes (Signed)
Pt co flu like symptoms since wed, pt orthostatic per EMS.

## 2013-03-22 NOTE — ED Notes (Signed)
Patient states that he feels a lot better than when he first arrived. States that he is not as weak .
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
## 2013-04-01 ENCOUNTER — Encounter: Payer: Self-pay | Admitting: Internal Medicine

## 2013-04-09 ENCOUNTER — Telehealth: Payer: Self-pay | Admitting: *Deleted

## 2013-04-09 MED ORDER — DEXLANSOPRAZOLE 60 MG PO CPDR: 60.0000 mg | DELAYED_RELEASE_CAPSULE | Freq: Every day | ORAL | Status: DC

## 2013-04-09 NOTE — Telephone Encounter (Signed)
[place] states that patient needs prior authorization on Nexium twice daily. I have contacted patient's insurance (phone [phone]) who states that patient must have tried and failed both Dexilant and omperazole prior to them approving Nexium. Patient has not tried either of these yet. Therefore, we will send a prescription of Dexilant once daily at this time and he will call us if this does not help his reflux.

## 2013-05-01 ENCOUNTER — Ambulatory Visit (INDEPENDENT_AMBULATORY_CARE_PROVIDER_SITE_OTHER): Payer: Medicare Other | Admitting: Pulmonary Disease

## 2013-05-01 ENCOUNTER — Encounter: Payer: Self-pay | Admitting: Pulmonary Disease

## 2013-05-01 VITALS — BP 120/60 | HR 79 | Temp 97.7°F | Ht 76.0 in | Wt 193.0 lb

## 2013-05-01 DIAGNOSIS — R05 Cough: Secondary | ICD-10-CM

## 2013-05-01 DIAGNOSIS — R053 Chronic cough: Secondary | ICD-10-CM

## 2013-05-01 DIAGNOSIS — R059 Cough, unspecified: Secondary | ICD-10-CM

## 2013-05-01 MED ORDER — BENZONATATE 100 MG PO CAPS: 200.0000 mg | ORAL_CAPSULE | Freq: Four times a day (QID) | ORAL | Status: DC | PRN

## 2013-05-01 NOTE — Progress Notes (Signed)
   Subjective:    Patient ID: Matthew [name], male    DOB: 1950-08-28, 63 y.o.   MRN: [id number]  HPI The patient is a 63 year old male who I've been asked to see for chronic cough. He tells me that he has had a dry hacking cough since the end of December, and feels a tickle in his throat that causes him to cough. He denies any nasal congestion or rhinorrhea, and is unsure if he is having postnasal drip. He does have a long history of reflux disease, and recently underwent endoscopy where he was found to have Barrett's esophagus and a hiatal hernia.  He has been started on a twice a day proton pump inhibitor, but still has some breakthrough reflux symptoms at times. He was also treated with a course of prednisone last week, but does not think it has made a big difference to his symptoms. The patient thinks he has dyspnea on exertion, but is unable to quantify at this time. He usually walks on a daily basis, but has not been doing so because of cough.  He denies any history of childhood asthma. He has had a chest x-ray that is unavailable for my review, but the report shows no acute process.   Review of Systems  Constitutional: Negative for fever and unexpected weight change.  HENT: Positive for sneezing. Negative for congestion, dental problem, ear pain, nosebleeds, postnasal drip, rhinorrhea, sinus pressure, sore throat and trouble swallowing.   Eyes: Negative for redness and itching.  Respiratory: Positive for cough and shortness of breath. Negative for chest tightness and wheezing.   Cardiovascular: Negative for palpitations and leg swelling.  Gastrointestinal: Negative for nausea and vomiting.       Acid heartburn  Genitourinary: Negative for dysuria.  Musculoskeletal: Positive for arthralgias and joint swelling.  Skin: Negative for rash.  Neurological: Negative for headaches.  Hematological: Does not bruise/bleed easily.  Psychiatric/Behavioral: Negative for dysphoric mood. The patient is  nervous/anxious.        Objective:   Physical Exam Constitutional:  Well developed, no acute distress  HENT:  Nares patent without discharge, enlarged turbinates  Oropharynx without exudate, palate and uvula are normal  Eyes:  Perrla, eomi, no scleral icterus  Neck:  No JVD, no TMG  Cardiovascular:  Normal rate, regular rhythm, no rubs or gallops.  No murmurs        Intact distal pulses  Pulmonary :  Normal breath sounds, no stridor or respiratory distress   Inspiratory pop and squeak in the left base, and intermittant in right base.  No definite wheeze.   Abdominal:  Soft, nondistended, bowel sounds present.  No tenderness noted.   Musculoskeletal:  No lower extremity edema noted.  Lymph Nodes:  No cervical lymphadenopathy noted  Skin:  No cyanosis noted  Neurologic:  Alert, appropriate, moves all 4 extremities without obvious deficit.         Assessment & Plan:

## 2013-05-01 NOTE — Patient Instructions (Signed)
Stay on your nexium am and pm as Dr. Olevia Perches has prescribed.  I would like you to get zantac at the drugstore otc, and take 150mg  at bedtime each night. Get zyrtec 10mg  otc and take one each am.  If you can't find, have the pharmacist help you. Will give you a prescription for tessalon pearls 100mg , and take 2 every 6 hrs only if needed for cough. Will check scan of your chest in light of your abnormal breath sounds, and will let you know the results.

## 2013-05-01 NOTE — Assessment & Plan Note (Signed)
The patient has a chronic cough of 6 weeks duration, and it is unclear how much of this is upper airway versus lower. His chest x-ray did not show any acute process, but he clearly has abnormal breath sounds in both bases left greater than right. His spirometry today does not show airflow obstruction. The patient does have a history of significant reflux with Barrett's esophagus and hiatal hernia at recent endoscopy. He is on twice a day proton pump inhibitor, but admits that he has occasional breakthrough symptoms. He also notes a tickle in his throat which drives his cough. At this point, I would like to treat for postnasal drip empirically, and will also add Zantac at bedtime to his reflux regimen. Will also order ct chest in light of his abnormal breath sounds in the bases.

## 2013-05-03 ENCOUNTER — Ambulatory Visit (HOSPITAL_COMMUNITY)
Admission: RE | Admit: 2013-05-03 | Discharge: 2013-05-03 | Disposition: A | Discharge status: Home / Self Care | Payer: Medicare Other | Source: Ambulatory Visit | Attending: Pulmonary Disease | Admitting: Pulmonary Disease

## 2013-05-03 DIAGNOSIS — R05 Cough: Secondary | ICD-10-CM

## 2013-05-03 DIAGNOSIS — R059 Cough, unspecified: Secondary | ICD-10-CM | POA: Insufficient documentation

## 2013-05-03 DIAGNOSIS — I2584 Coronary atherosclerosis due to calcified coronary lesion: Secondary | ICD-10-CM | POA: Insufficient documentation

## 2013-05-03 DIAGNOSIS — R0989 Other specified symptoms and signs involving the circulatory and respiratory systems: Secondary | ICD-10-CM | POA: Insufficient documentation

## 2013-05-03 DIAGNOSIS — R053 Chronic cough: Secondary | ICD-10-CM

## 2013-05-03 DIAGNOSIS — R918 Other nonspecific abnormal finding of lung field: Secondary | ICD-10-CM | POA: Insufficient documentation

## 2013-05-06 ENCOUNTER — Telehealth: Payer: Self-pay | Admitting: Pulmonary Disease

## 2013-05-06 MED ORDER — CETIRIZINE HCL 10 MG PO TABS: 10.0000 mg | ORAL_TABLET | Freq: Every day | ORAL | Status: DC

## 2013-05-06 NOTE — Telephone Encounter (Signed)
Pt is needing Zyrtec sent in as a prescription. It will not cost his as much if it's done this way. This has been taken care of. Nothing further is needed.

## 2013-05-07 ENCOUNTER — Telehealth: Payer: Self-pay | Admitting: Internal Medicine

## 2013-05-07 NOTE — Telephone Encounter (Signed)
I have contacted the patient pharmacy to find out what the problem is with patient's prescription. Pharmacy has been sending a prior authorization notice to what they thought was our fax number. However, they had the number incorrect. They have now faxed the note to our office and I have contacted [employer] at [phone] to initiate prior authorization for patient's Nexium (dx: hx barretts, GERD... Tried Prevacid, Nexium once daily and Dexilant in the past). Information will be sent for clinical review and a note of determination will be sent to our office.

## 2013-05-08 ENCOUNTER — Other Ambulatory Visit: Payer: Self-pay | Admitting: Pulmonary Disease

## 2013-05-08 DIAGNOSIS — R918 Other nonspecific abnormal finding of lung field: Secondary | ICD-10-CM | POA: Insufficient documentation

## 2013-05-08 DIAGNOSIS — R053 Chronic cough: Secondary | ICD-10-CM

## 2013-05-08 DIAGNOSIS — R05 Cough: Secondary | ICD-10-CM

## 2013-05-08 NOTE — Telephone Encounter (Signed)
Matthew [name] has approved patient's Nexium from 03/28/13-03/09/14. Patient advised.

## 2013-06-13 ENCOUNTER — Telehealth: Payer: Self-pay | Admitting: Pulmonary Disease

## 2013-06-13 MED ORDER — RANITIDINE HCL 150 MG PO TABS: 150.0000 mg | ORAL_TABLET | Freq: Every day | ORAL | Status: DC

## 2013-06-13 NOTE — Telephone Encounter (Signed)
Pt is requesting that Zantac be sent to his pharmacy. He can get this free through his insurance with a prescription. Per KC's last OV, he recommenced 150mg  QHS. This has been sent in.

## 2013-09-17 ENCOUNTER — Other Ambulatory Visit: Payer: Self-pay | Admitting: Internal Medicine

## 2013-10-21 ENCOUNTER — Other Ambulatory Visit: Payer: Self-pay | Admitting: Internal Medicine

## 2013-11-11 ENCOUNTER — Other Ambulatory Visit: Payer: Self-pay | Admitting: Neurosurgery

## 2013-11-11 DIAGNOSIS — M5412 Radiculopathy, cervical region: Secondary | ICD-10-CM

## 2014-02-17 ENCOUNTER — Other Ambulatory Visit: Payer: Self-pay | Admitting: Internal Medicine

## 2014-05-02 ENCOUNTER — Other Ambulatory Visit: Payer: Self-pay | Admitting: Internal Medicine

## 2014-05-05 ENCOUNTER — Telehealth: Payer: Self-pay | Admitting: Internal Medicine

## 2014-05-06 ENCOUNTER — Telehealth: Payer: Self-pay | Admitting: *Deleted

## 2014-05-06 ENCOUNTER — Encounter: Payer: Self-pay | Admitting: *Deleted

## 2014-05-06 MED ORDER — ESOMEPRAZOLE MAGNESIUM 40 MG PO CPDR: DELAYED_RELEASE_CAPSULE | ORAL | Status: DC

## 2014-05-06 NOTE — Telephone Encounter (Signed)
Prior Authorization approved for Esomeprazole (NEXIUM) 40 mg, one (1) capsule, twice a day. Approved from 03/28/2014 to 03/28/2015; Case ID #: [id number]. Rx was sent to [facility] at [address] in [place], [place].

## 2014-05-28 ENCOUNTER — Telehealth: Payer: Self-pay | Admitting: Pulmonary Disease

## 2014-05-28 DIAGNOSIS — R911 Solitary pulmonary nodule: Secondary | ICD-10-CM

## 2014-05-28 NOTE — Telephone Encounter (Signed)
Order has been placed per [facility] documentation on pt's last CT scan. He is aware. Nothing further was needed.

## 2014-05-30 ENCOUNTER — Other Ambulatory Visit: Payer: Medicare Other

## 2014-05-30 ENCOUNTER — Ambulatory Visit (HOSPITAL_COMMUNITY)
Admission: RE | Admit: 2014-05-30 | Discharge: 2014-05-30 | Disposition: A | Discharge status: Home / Self Care | Payer: Medicare Other | Source: Ambulatory Visit | Attending: Pulmonary Disease | Admitting: Pulmonary Disease

## 2014-05-30 DIAGNOSIS — R911 Solitary pulmonary nodule: Secondary | ICD-10-CM | POA: Diagnosis present

## 2014-06-06 ENCOUNTER — Telehealth: Payer: Self-pay | Admitting: Pulmonary Disease

## 2014-06-06 NOTE — Telephone Encounter (Signed)
Result Note     Let pt know that his abnormalities on chest ct last time have resolved. His remaining tiny spots are completely stable, and therefore are not cancers. No further followup needed. Great news  --  I spoke with patient about results and he verbalized understanding and had no questions

## 2014-07-09 DIAGNOSIS — Z7982 Long term (current) use of aspirin: Secondary | ICD-10-CM | POA: Diagnosis not present

## 2014-07-09 DIAGNOSIS — Z87891 Personal history of nicotine dependence: Secondary | ICD-10-CM | POA: Diagnosis not present

## 2014-07-09 DIAGNOSIS — T461X1A Poisoning by calcium-channel blockers, accidental (unintentional), initial encounter: Secondary | ICD-10-CM | POA: Diagnosis not present

## 2014-07-09 DIAGNOSIS — F419 Anxiety disorder, unspecified: Secondary | ICD-10-CM | POA: Insufficient documentation

## 2014-07-09 DIAGNOSIS — X58XXXA Exposure to other specified factors, initial encounter: Secondary | ICD-10-CM | POA: Diagnosis not present

## 2014-07-09 DIAGNOSIS — M1990 Unspecified osteoarthritis, unspecified site: Secondary | ICD-10-CM | POA: Diagnosis not present

## 2014-07-09 DIAGNOSIS — I10 Essential (primary) hypertension: Secondary | ICD-10-CM | POA: Diagnosis not present

## 2014-07-09 DIAGNOSIS — Y9289 Other specified places as the place of occurrence of the external cause: Secondary | ICD-10-CM | POA: Insufficient documentation

## 2014-07-09 DIAGNOSIS — J45909 Unspecified asthma, uncomplicated: Secondary | ICD-10-CM | POA: Insufficient documentation

## 2014-07-09 DIAGNOSIS — Z79899 Other long term (current) drug therapy: Secondary | ICD-10-CM | POA: Diagnosis not present

## 2014-07-09 DIAGNOSIS — Z862 Personal history of diseases of the blood and blood-forming organs and certain disorders involving the immune mechanism: Secondary | ICD-10-CM | POA: Insufficient documentation

## 2014-07-09 DIAGNOSIS — Y998 Other external cause status: Secondary | ICD-10-CM | POA: Diagnosis not present

## 2014-07-09 DIAGNOSIS — Z872 Personal history of diseases of the skin and subcutaneous tissue: Secondary | ICD-10-CM | POA: Diagnosis not present

## 2014-07-09 DIAGNOSIS — Y9389 Activity, other specified: Secondary | ICD-10-CM | POA: Insufficient documentation

## 2014-07-09 DIAGNOSIS — G8929 Other chronic pain: Secondary | ICD-10-CM | POA: Insufficient documentation

## 2014-07-09 DIAGNOSIS — K219 Gastro-esophageal reflux disease without esophagitis: Secondary | ICD-10-CM | POA: Diagnosis not present

## 2014-07-09 DIAGNOSIS — Z880 Allergy status to penicillin: Secondary | ICD-10-CM | POA: Insufficient documentation

## 2014-07-09 DIAGNOSIS — E538 Deficiency of other specified B group vitamins: Secondary | ICD-10-CM | POA: Diagnosis not present

## 2014-07-10 ENCOUNTER — Encounter (HOSPITAL_COMMUNITY): Payer: Self-pay | Admitting: Emergency Medicine

## 2014-07-10 ENCOUNTER — Emergency Department (HOSPITAL_COMMUNITY)
Admission: EM | Admit: 2014-07-10 | Discharge: 2014-07-10 | Disposition: A | Discharge status: Home / Self Care | Payer: Medicare Other | Attending: Emergency Medicine | Admitting: Emergency Medicine

## 2014-07-10 DIAGNOSIS — T465X1A Poisoning by other antihypertensive drugs, accidental (unintentional), initial encounter: Secondary | ICD-10-CM

## 2014-07-10 NOTE — ED Notes (Signed)
Patient ambulated to restroom with assist, tolerated well 

## 2014-07-10 NOTE — Discharge Instructions (Signed)
Continue with normal medicine regimen as prescribed.   Accidental Overdose A drug overdose occurs when a chemical substance (drug or medication) is used in amounts large enough to overcome a person. This may result in severe illness or death. This is a type of poisoning. Accidental overdoses of medications or other substances come from a variety of reasons. When this happens accidentally, it is often because the person taking the substance does not know enough about what they have taken. Drugs which commonly cause overdose deaths are alcohol, psychotropic medications (medications which affect the mind), pain medications, illegal drugs (street drugs) such as cocaine and heroin, and multiple drugs taken at the same time. It may result from careless behavior (such as over-indulging at a party). Other causes of overdose may include multiple drug use, a lapse in memory, or drug use after a period of no drug use.  Sometimes overdosing occurs because a person cannot remember if they have taken their medication.  A common unintentional overdose in young children involves multi-vitamins containing iron. Iron is a part of the hemoglobin molecule in blood. It is used to transport oxygen to living cells. When taken in small amounts, iron allows the body to restock hemoglobin. In large amounts, it causes problems in the body. If this overdose is not treated, it can lead to death. Never take medicines that show signs of tampering or do not seem quite right. Never take medicines in the dark or in poor lighting. Read the label and check each dose of medicine before you take it. When adults are poisoned, it happens most often through carelessness or lack of information. Taking medicines in the dark or taking medicine prescribed for someone else to treat the same type of problem is a dangerous practice. SYMPTOMS  Symptoms of overdose depend on the medication and amount taken. They can vary from over-activity with stimulant  over-dosage, to sleepiness from depressants such as alcohol, narcotics and tranquilizers. Confusion, dizziness, nausea and vomiting may be present. If problems are severe enough coma and death may result. DIAGNOSIS  Diagnosis and management are generally straightforward if the drug is known. Otherwise it is more difficult. At times, certain symptoms and signs exhibited by the patient, or blood tests, can reveal the drug in question.  TREATMENT  In an emergency department, most patients can be treated with supportive measures. Antidotes may be available if there has been an overdose of opioids or benzodiazepines. A rapid improvement will often occur if this is the cause of overdose. At home or away from medical care:  There may be no immediate problems or warning signs in children.  Not everything works well in all cases of poisoning.  Take immediate action. Poisons may act quickly.  If you think someone has swallowed medicine or a household product, and the person is unconscious, having seizures (convulsions), or is not breathing, immediately call for an ambulance. IF a person is conscious and appears to be doing OK but has swallowed a poison:  Do not wait to see what effect the poison will have. Immediately call a poison control center (listed in the white pages of your telephone book under "Poison Control" or inside the front cover with other emergency numbers). Some poison control centers have TTY capability for the deaf. Check with your local center if you or someone in your family requires this service.  Keep the container so you can read the label on the product for ingredients.  Describe what, when, and how much was taken  and the age and condition of the person poisoned. Inform them if the person is vomiting, choking, drowsy, shows a change in color or temperature of skin, is conscious or unconscious, or is convulsing.  Do not cause vomiting unless instructed by medical personnel. Do not  induce vomiting or force liquids into a person who is convulsing, unconscious, or very drowsy. Stay calm and in control.   Activated charcoal also is sometimes used in certain types of poisoning and you may wish to add a supply to your emergency medicines. It is available without a prescription. Call a poison control center before using this medication. PREVENTION  Thousands of children die every year from unintentional poisoning. This may be from household chemicals, poisoning from carbon monoxide in a car, taking their parent's medications, or simply taking a few iron pills or vitamins with iron. Poisoning comes from unexpected sources.  Store medicines out of the sight and reach of children, preferably in a locked cabinet. Do not keep medications in a food cabinet. Always store your medicines in a secure place. Get rid of expired medications.  If you have children living with you or have them as occasional guests, you should have child-resistant caps on your medicine containers. Keep everything out of reach. Child proof your home.  If you are called to the telephone or to answer the door while you are taking a medicine, take the container with you or put the medicine out of the reach of small children.  Do not take your medication in front of children. Do not tell your child how good a medication is and how good it is for them. They may get the idea it is more of a treat.  If you are an adult and have accidentally taken an overdose, you need to consider how this happened and what can be done to prevent it from happening again. If this was from a street drug or alcohol, determine if there is a problem that needs addressing. If you are not sure a problems exists, it is easy to talk to a professional and ask them if they think you have a problem. It is better to handle this problem in this way before it happens again and has a much worse consequence. Document Released: 05/28/2004 Document Revised:  06/06/2011 Document Reviewed: 11/03/2008 [facility] Patient Information 2015 [place], [place]. This information is not intended to replace advice given to you by your health care provider. Make sure you discuss any questions you have with your health care provider.

## 2014-07-10 NOTE — ED Notes (Signed)
Pt unsure if he didn't take meds or if he took too much due to short term memory loss. Pt states he takes losartan and norvasc
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
## 2014-11-17 ENCOUNTER — Other Ambulatory Visit: Payer: Self-pay | Admitting: Internal Medicine

## 2014-12-15 ENCOUNTER — Encounter (HOSPITAL_COMMUNITY): Payer: Self-pay | Admitting: *Deleted

## 2014-12-15 ENCOUNTER — Emergency Department (HOSPITAL_COMMUNITY)
Admission: EM | Admit: 2014-12-15 | Discharge: 2014-12-16 | Disposition: A | Discharge status: Home / Self Care | Payer: Medicare Other | Attending: Emergency Medicine | Admitting: Emergency Medicine

## 2014-12-15 DIAGNOSIS — Z87891 Personal history of nicotine dependence: Secondary | ICD-10-CM | POA: Diagnosis not present

## 2014-12-15 DIAGNOSIS — G8929 Other chronic pain: Secondary | ICD-10-CM | POA: Diagnosis not present

## 2014-12-15 DIAGNOSIS — Z7982 Long term (current) use of aspirin: Secondary | ICD-10-CM | POA: Diagnosis not present

## 2014-12-15 DIAGNOSIS — Z872 Personal history of diseases of the skin and subcutaneous tissue: Secondary | ICD-10-CM | POA: Insufficient documentation

## 2014-12-15 DIAGNOSIS — Y9289 Other specified places as the place of occurrence of the external cause: Secondary | ICD-10-CM | POA: Insufficient documentation

## 2014-12-15 DIAGNOSIS — Z79899 Other long term (current) drug therapy: Secondary | ICD-10-CM | POA: Insufficient documentation

## 2014-12-15 DIAGNOSIS — M1990 Unspecified osteoarthritis, unspecified site: Secondary | ICD-10-CM | POA: Diagnosis not present

## 2014-12-15 DIAGNOSIS — T465X1A Poisoning by other antihypertensive drugs, accidental (unintentional), initial encounter: Secondary | ICD-10-CM | POA: Diagnosis present

## 2014-12-15 DIAGNOSIS — K219 Gastro-esophageal reflux disease without esophagitis: Secondary | ICD-10-CM | POA: Diagnosis not present

## 2014-12-15 DIAGNOSIS — Z862 Personal history of diseases of the blood and blood-forming organs and certain disorders involving the immune mechanism: Secondary | ICD-10-CM | POA: Insufficient documentation

## 2014-12-15 DIAGNOSIS — Y998 Other external cause status: Secondary | ICD-10-CM | POA: Diagnosis not present

## 2014-12-15 DIAGNOSIS — J45909 Unspecified asthma, uncomplicated: Secondary | ICD-10-CM | POA: Insufficient documentation

## 2014-12-15 DIAGNOSIS — Y9389 Activity, other specified: Secondary | ICD-10-CM | POA: Diagnosis not present

## 2014-12-15 DIAGNOSIS — F419 Anxiety disorder, unspecified: Secondary | ICD-10-CM | POA: Diagnosis not present

## 2014-12-15 DIAGNOSIS — I10 Essential (primary) hypertension: Secondary | ICD-10-CM | POA: Insufficient documentation

## 2014-12-15 DIAGNOSIS — Z880 Allergy status to penicillin: Secondary | ICD-10-CM | POA: Diagnosis not present

## 2014-12-15 LAB — CBC WITH DIFFERENTIAL/PLATELET
Basophils Absolute: 0 10*3/uL (ref 0.0–0.1)
Basophils Relative: 0 %
Eosinophils Absolute: 0.1 10*3/uL (ref 0.0–0.7)
Eosinophils Relative: 2 %
HCT: 31.7 % — ABNORMAL LOW (ref 39.0–52.0)
Hemoglobin: 10.9 g/dL — ABNORMAL LOW (ref 13.0–17.0)
Lymphocytes Relative: 18 %
Lymphs Abs: 1.2 10*3/uL (ref 0.7–4.0)
MCH: 29.9 pg (ref 26.0–34.0)
MCHC: 34.4 g/dL (ref 30.0–36.0)
MCV: 87.1 fL (ref 78.0–100.0)
Monocytes Absolute: 0.8 10*3/uL (ref 0.1–1.0)
Monocytes Relative: 12 %
Neutro Abs: 4.6 10*3/uL (ref 1.7–7.7)
Neutrophils Relative %: 68 %
Platelets: 169 10*3/uL (ref 150–400)
RBC: 3.64 MIL/uL — ABNORMAL LOW (ref 4.22–5.81)
RDW: 12.5 % (ref 11.5–15.5)
WBC: 6.8 10*3/uL (ref 4.0–10.5)

## 2014-12-15 LAB — BASIC METABOLIC PANEL
Anion gap: 6 (ref 5–15)
BUN: 24 mg/dL — ABNORMAL HIGH (ref 6–20)
CO2: 27 mmol/L (ref 22–32)
Calcium: 8.9 mg/dL (ref 8.9–10.3)
Chloride: 99 mmol/L — ABNORMAL LOW (ref 101–111)
Creatinine, Ser: 1.49 mg/dL — ABNORMAL HIGH (ref 0.61–1.24)
GFR calc Af Amer: 56 mL/min — ABNORMAL LOW (ref >60)
GFR calc non Af Amer: 49 mL/min — ABNORMAL LOW (ref >60)
Glucose, Bld: 125 mg/dL — ABNORMAL HIGH (ref 65–99)
Potassium: 4 mmol/L (ref 3.5–5.1)
Sodium: 132 mmol/L — ABNORMAL LOW (ref 135–145)

## 2014-12-15 NOTE — ED Notes (Signed)
Pt states he suffer from short term memory lose. Pt thinks he took an extra norvasc tonight. Pt was advised to come to the ER for observation.

## 2014-12-15 NOTE — ED Notes (Signed)
According to poison control pt took 20mg  of norvasc around 2030. EKG needs to be done and iv fluids and watch blood pressure and heart rate. Pt needs to be observed 12hours per poison control, DR Jeneen Rinks notified.

## 2014-12-15 NOTE — ED Notes (Addendum)
Patient placed in gown and on cardiac monitor at this time. EKG done and given Dr Jeneen Rinks
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
## 2014-12-15 NOTE — ED Notes (Signed)
Pt states he doesn't remember how many norvasc 10mg  tabs he has taken today; pt states he may have taken an extra one and the helpline told pt to come to ED to be monitored

## 2014-12-16 NOTE — Discharge Instructions (Signed)
Recheck as needed. You need to get someone to help you with your medications or get a different pill box to help you keep your medications straight.

## 2014-12-16 NOTE — ED Notes (Signed)
Pt alert & oriented x4, stable gait. Patient given discharge instructions, paperwork & prescription(s). Patient  instructed to stop at the registration desk to finish any additional paperwork. Patient verbalized understanding. Pt left department w/ no further questions. 

## 2015-02-09 ENCOUNTER — Telehealth: Payer: Self-pay | Admitting: Internal Medicine

## 2015-02-09 ENCOUNTER — Other Ambulatory Visit: Payer: Self-pay | Admitting: *Deleted

## 2015-02-09 MED ORDER — ESOMEPRAZOLE MAGNESIUM 40 MG PO CPDR
40.0000 mg | DELAYED_RELEASE_CAPSULE | Freq: Two times a day (BID) | ORAL | Status: DC
Start: 2015-02-09 — End: 2015-04-14

## 2015-02-09 NOTE — Telephone Encounter (Signed)
Advised patient to contact his pharmacy if he needs a refill on his medication and they will fax [place] over a request or a PA form if one needs to be obtained.

## 2015-04-14 ENCOUNTER — Other Ambulatory Visit: Payer: Self-pay | Admitting: Gastroenterology

## 2015-04-15 ENCOUNTER — Ambulatory Visit (INDEPENDENT_AMBULATORY_CARE_PROVIDER_SITE_OTHER): Payer: Medicare Other | Admitting: Gastroenterology

## 2015-04-15 ENCOUNTER — Encounter: Payer: Self-pay | Admitting: Gastroenterology

## 2015-04-15 VITALS — BP 110/52 | HR 88 | Ht 73.75 in | Wt 193.0 lb

## 2015-04-15 DIAGNOSIS — K2100 Gastro-esophageal reflux disease with esophagitis, without bleeding: Secondary | ICD-10-CM

## 2015-04-15 DIAGNOSIS — Z1211 Encounter for screening for malignant neoplasm of colon: Secondary | ICD-10-CM

## 2015-04-15 MED ORDER — ESOMEPRAZOLE MAGNESIUM 40 MG PO CPDR: 40.0000 mg | DELAYED_RELEASE_CAPSULE | Freq: Two times a day (BID) | ORAL | Status: DC

## 2015-04-15 NOTE — Patient Instructions (Signed)
It has been recommended to you by your physician that you have a(n)Colonoscopy  completed. Per your request, we did not schedule the procedure(s) today. Please contact our office at [phone] should you decide to have the procedure completed. 
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
## 2015-09-16 ENCOUNTER — Ambulatory Visit (HOSPITAL_COMMUNITY)
Admission: RE | Admit: 2015-09-16 | Discharge: 2015-09-16 | Disposition: A | Discharge status: Home / Self Care | Payer: Medicare Other | Source: Ambulatory Visit | Attending: Family Medicine | Admitting: Family Medicine

## 2015-09-16 ENCOUNTER — Other Ambulatory Visit (HOSPITAL_COMMUNITY): Payer: Self-pay | Admitting: Family Medicine

## 2015-09-16 DIAGNOSIS — R059 Cough, unspecified: Secondary | ICD-10-CM

## 2015-09-16 DIAGNOSIS — R05 Cough: Secondary | ICD-10-CM

## 2015-12-21 ENCOUNTER — Encounter: Payer: Self-pay | Admitting: Gastroenterology

## 2015-12-21 ENCOUNTER — Encounter: Payer: Self-pay | Admitting: Family Medicine

## 2016-02-01 ENCOUNTER — Ambulatory Visit (AMBULATORY_SURGERY_CENTER): Payer: Self-pay | Admitting: *Deleted

## 2016-02-01 VITALS — Ht 76.0 in | Wt 195.0 lb

## 2016-02-01 DIAGNOSIS — Z1211 Encounter for screening for malignant neoplasm of colon: Secondary | ICD-10-CM

## 2016-02-01 MED ORDER — NA SULFATE-K SULFATE-MG SULF 17.5-3.13-1.6 GM/177ML PO SOLN: 1.0000 | Freq: Once | ORAL | 0 refills | Status: AC

## 2016-02-01 NOTE — Progress Notes (Signed)
Pt states he has seen some blood in his stool recently- he does have constipation- uses stool softener and it helps also eats raisin bran to soften stool- recent labs WNL--  No egg or soy allergy known to patient  No issues with past sedation with any surgeries  or procedures, no intubation problems  No diet pills per patient No home 02 use per patient  No blood thinners per patient  Pt states issues with constipation --he uses stool softener and raisin bran and with this stools soft  No A fib or A flutter   Pt states he hopes he can remember how to do prep- we discussed the prep several times as we are doing a 2 day since hes constipated. Encouraged to call with questions

## 2016-02-15 ENCOUNTER — Encounter: Payer: Self-pay | Admitting: Gastroenterology

## 2016-02-15 ENCOUNTER — Ambulatory Visit (AMBULATORY_SURGERY_CENTER): Payer: Medicare Other | Admitting: Gastroenterology

## 2016-02-15 VITALS — BP 152/75 | HR 60 | Temp 95.7°F | Resp 15 | Ht 76.0 in | Wt 195.0 lb

## 2016-02-15 DIAGNOSIS — Z1211 Encounter for screening for malignant neoplasm of colon: Secondary | ICD-10-CM | POA: Diagnosis not present

## 2016-02-15 DIAGNOSIS — Z1212 Encounter for screening for malignant neoplasm of rectum: Secondary | ICD-10-CM

## 2016-02-15 MED ORDER — SODIUM CHLORIDE 0.9 % IV SOLN: 500.0000 mL | INTRAVENOUS | Status: DC

## 2016-02-15 NOTE — Op Note (Addendum)
[facility] Patient Name: Matthew [name] Procedure Date: 02/15/2016 9:53 AM MRN: [id number] Endoscopist: Mauri Pole , MD Age: 65 Referring MD:  Date of Birth: 1950-04-27 Gender: Male Account #: [id number] Procedure:                Colonoscopy Indications:              Screening for colorectal malignant neoplasm, Last                            colonoscopy: 2005 Medicines:                Monitored Anesthesia Care Procedure:                Pre-Anesthesia Assessment:                           - Prior to the procedure, a History and Physical                            was performed, and patient medications and                            allergies were reviewed. The patient's tolerance of                            previous anesthesia was also reviewed. The risks                            and benefits of the procedure and the sedation                            options and risks were discussed with the patient.                            All questions were answered, and informed consent                            was obtained. Prior Anticoagulants: The patient has                            taken no previous anticoagulant or antiplatelet                            agents. ASA Grade Assessment: III - A patient with                            severe systemic disease. After reviewing the risks                            and benefits, the patient was deemed in                            satisfactory condition to undergo the procedure.  After obtaining informed consent, the colonoscope                            was passed under direct vision. Throughout the                            procedure, the patient's blood pressure, pulse, and                            oxygen saturations were monitored continuously. The                            Model CF-HQ190L [id number]) scope was introduced                            through the anus and advanced to the  the cecum,                            identified by appendiceal orifice and ileocecal                            valve. The colonoscopy was performed without                            difficulty. The patient tolerated the procedure                            well. The quality of the bowel preparation was                            excellent. The ileocecal valve, appendiceal                            orifice, and rectum were photographed. Scope In: 10:00:14 AM Scope Out: 10:12:22 AM Scope Withdrawal Time: 0 hours 9 minutes 51 seconds  Total Procedure Duration: 0 hours 12 minutes 8 seconds  Findings:                 The perianal and digital rectal examinations were                            normal.                           A few small-mouthed diverticula were found in the                            sigmoid colon.                           The exam was otherwise without abnormality. Complications:            No immediate complications. Estimated Blood Loss:     Estimated blood loss: none. Impression:               - Diverticulosis in the sigmoid colon.                           -  The examination was otherwise normal.                           - No specimens collected. Recommendation:           - Patient has a contact number available for                            emergencies. The signs and symptoms of potential                            delayed complications were discussed with the                            patient. Return to normal activities tomorrow.                            Written discharge instructions were provided to the                            patient.                           - Resume previous diet.                           - Continue present medications.                           - Repeat colonoscopy in 10 years for screening                            purposes. Mauri Pole, MD 02/15/2016 10:17:08 AM This report has been signed electronically.

## 2016-02-15 NOTE — Patient Instructions (Signed)
YOU HAD AN ENDOSCOPIC PROCEDURE TODAY AT THE [facility]:   Refer to the procedure report that was given to you for any specific questions about what was found during the examination.  If the procedure report does not answer your questions, please call your gastroenterologist to clarify.  If you requested that your care partner not be given the details of your procedure findings, then the procedure report has been included in a sealed envelope for you to review at your convenience later.  YOU SHOULD EXPECT: Some feelings of bloating in the abdomen. Passage of more gas than usual.  Walking can help get rid of the air that was put into your GI tract during the procedure and reduce the bloating. If you had a lower endoscopy (such as a colonoscopy or flexible sigmoidoscopy) you may notice spotting of blood in your stool or on the toilet paper. If you underwent a bowel prep for your procedure, you may not have a normal bowel movement for a few days.  Please Note:  You might notice some irritation and congestion in your nose or some drainage.  This is from the oxygen used during your procedure.  There is no need for concern and it should clear up in a day or so.  SYMPTOMS TO REPORT IMMEDIATELY:   Following lower endoscopy (colonoscopy or flexible sigmoidoscopy):  Excessive amounts of blood in the stool  Significant tenderness or worsening of abdominal pains  Swelling of the abdomen that is new, acute  Fever of 100F or higher    For urgent or emergent issues, a gastroenterologist can be reached at any hour by calling [phone].   DIET:  We do recommend a small meal at first, but then you may proceed to your regular diet.  Drink plenty of fluids but you should avoid alcoholic beverages for 24 hours.  ACTIVITY:  You should plan to take it easy for the rest of today and you should NOT DRIVE or use heavy machinery until tomorrow (because of the sedation medicines used during the test).     FOLLOW UP: Our staff will call the number listed on your records the next business day following your procedure to check on you and address any questions or concerns that you may have regarding the information given to you following your procedure. If we do not reach you, we will leave a message.  However, if you are feeling well and you are not experiencing any problems, there is no need to return our call.  We will assume that you have returned to your regular daily activities without incident.  If any biopsies were taken you will be contacted by phone or by letter within the next 1-3 weeks.  Please call us at [phone] if you have not heard about the biopsies in 3 weeks.    SIGNATURES/CONFIDENTIALITY: You and/or your care partner have signed paperwork which will be entered into your electronic medical record.  These signatures attest to the fact that that the information above on your After Visit Summary has been reviewed and is understood.  Full responsibility of the confidentiality of this discharge information lies with you and/or your care-partner.   Resume medications. Information given on diverticulosis. 

## 2016-02-15 NOTE — Progress Notes (Signed)
A/ox3, pleased with MAC, report to RN 

## 2016-02-16 ENCOUNTER — Telehealth: Payer: Self-pay | Admitting: *Deleted

## 2016-02-16 NOTE — Telephone Encounter (Signed)
  Follow up Call-  Call back number 02/15/2016  Post procedure Call Back phone  # [phone]  Permission to leave phone message Yes  Some recent data might be hidden     Patient questions:  Do you have a fever, pain , or abdominal swelling? No. Pain Score  0 *  Have you tolerated food without any problems? Yes.    Have you been able to return to your normal activities? Yes.    Do you have any questions about your discharge instructions: Diet   No. Medications  No. Follow up visit  No.  Do you have questions or concerns about your Care? No.  Actions: * If pain score is 4 or above: No action needed, pain <4.

## 2016-05-12 ENCOUNTER — Other Ambulatory Visit: Payer: Self-pay | Admitting: Gastroenterology

## 2016-06-11 ENCOUNTER — Other Ambulatory Visit: Payer: Self-pay | Admitting: Gastroenterology

## 2016-07-11 ENCOUNTER — Other Ambulatory Visit: Payer: Self-pay | Admitting: Gastroenterology

## 2016-08-10 ENCOUNTER — Other Ambulatory Visit: Payer: Self-pay | Admitting: Gastroenterology

## 2016-09-09 ENCOUNTER — Other Ambulatory Visit: Payer: Self-pay | Admitting: Gastroenterology

## 2016-10-06 ENCOUNTER — Other Ambulatory Visit: Payer: Self-pay | Admitting: Gastroenterology

## 2016-11-09 ENCOUNTER — Other Ambulatory Visit: Payer: Self-pay | Admitting: Gastroenterology

## 2016-12-07 ENCOUNTER — Other Ambulatory Visit: Payer: Self-pay | Admitting: Gastroenterology

## 2017-01-05 ENCOUNTER — Other Ambulatory Visit: Payer: Self-pay | Admitting: Gastroenterology

## 2017-02-04 ENCOUNTER — Other Ambulatory Visit: Payer: Self-pay | Admitting: Gastroenterology

## 2017-03-07 ENCOUNTER — Other Ambulatory Visit: Payer: Self-pay | Admitting: Gastroenterology

## 2017-04-06 ENCOUNTER — Other Ambulatory Visit: Payer: Self-pay | Admitting: Gastroenterology

## 2017-05-06 ENCOUNTER — Other Ambulatory Visit: Payer: Self-pay | Admitting: Gastroenterology

## 2017-05-12 ENCOUNTER — Telehealth: Payer: Self-pay | Admitting: Gastroenterology

## 2017-05-12 MED ORDER — ESOMEPRAZOLE MAGNESIUM 40 MG PO CPDR: 40.0000 mg | DELAYED_RELEASE_CAPSULE | Freq: Two times a day (BID) | ORAL | 2 refills | Status: DC

## 2017-05-12 NOTE — Telephone Encounter (Signed)
Sent In Nexium to patient pharmacy

## 2017-07-25 ENCOUNTER — Ambulatory Visit (INDEPENDENT_AMBULATORY_CARE_PROVIDER_SITE_OTHER): Payer: Medicare Other | Admitting: Gastroenterology

## 2017-07-25 ENCOUNTER — Encounter: Payer: Self-pay | Admitting: Gastroenterology

## 2017-07-25 VITALS — BP 140/78 | HR 80 | Ht 76.0 in | Wt 186.0 lb

## 2017-07-25 DIAGNOSIS — K2100 Gastro-esophageal reflux disease with esophagitis, without bleeding: Secondary | ICD-10-CM

## 2017-07-25 MED ORDER — FAMOTIDINE 20 MG PO TABS: 20.0000 mg | ORAL_TABLET | Freq: Every day | ORAL | 3 refills | Status: DC

## 2017-07-25 NOTE — Patient Instructions (Addendum)
Decrease your Nexium to once daily before breakfast   We have sent Pepcid to your pharmacy   Gastroesophageal Reflux Disease, Adult Normally, food travels down the esophagus and stays in the stomach to be digested. However, when a person has gastroesophageal reflux disease (GERD), food and stomach acid move back up into the esophagus. When this happens, the esophagus becomes sore and inflamed. Over time, GERD can create small holes (ulcers) in the lining of the esophagus. What are the causes? This condition is caused by a problem with the muscle between the esophagus and the stomach (lower esophageal sphincter, or LES). Normally, the LES muscle closes after food passes through the esophagus to the stomach. When the LES is weakened or abnormal, it does not close properly, and that allows food and stomach acid to go back up into the esophagus. The LES can be weakened by certain dietary substances, medicines, and medical conditions, including:  Tobacco use.  Pregnancy.  Having a hiatal hernia.  Heavy alcohol use.  Certain foods and beverages, such as coffee, chocolate, onions, and peppermint.  What increases the risk? This condition is more likely to develop in:  People who have an increased body weight.  People who have connective tissue disorders.  People who use NSAID medicines.  What are the signs or symptoms? Symptoms of this condition include:  Heartburn.  Difficult or painful swallowing.  The feeling of having a lump in the throat.  Abitter taste in the mouth.  Bad breath.  Having a large amount of saliva.  Having an upset or bloated stomach.  Belching.  Chest pain.  Shortness of breath or wheezing.  Ongoing (chronic) cough or a night-time cough.  Wearing away of tooth enamel.  Weight loss.  Different conditions can cause chest pain. Make sure to see your health care provider if you experience chest pain. How is this diagnosed? Your health care  provider will take a medical history and perform a physical exam. To determine if you have mild or severe GERD, your health care provider may also monitor how you respond to treatment. You may also have other tests, including:  An endoscopy toexamine your stomach and esophagus with a small camera.  A test thatmeasures the acidity level in your esophagus.  A test thatmeasures how much pressure is on your esophagus.  A barium swallow or modified barium swallow to show the shape, size, and functioning of your esophagus.  How is this treated? The goal of treatment is to help relieve your symptoms and to prevent complications. Treatment for this condition may vary depending on how severe your symptoms are. Your health care provider may recommend:  Changes to your diet.  Medicine.  Surgery.  Follow these instructions at home: Diet  Follow a diet as recommended by your health care provider. This may involve avoiding foods and drinks such as: ? Coffee and tea (with or without caffeine). ? Drinks that containalcohol. ? Energy drinks and sports drinks. ? Carbonated drinks or sodas. ? Chocolate and cocoa. ? Peppermint and mint flavorings. ? Garlic and onions. ? Horseradish. ? Spicy and acidic foods, including peppers, chili powder, curry powder, vinegar, hot sauces, and barbecue sauce. ? Citrus fruit juices and citrus fruits, such as oranges, lemons, and limes. ? Tomato-based foods, such as red sauce, chili, salsa, and pizza with red sauce. ? Fried and fatty foods, such as donuts, french fries, potato chips, and high-fat dressings. ? High-fat meats, such as hot dogs and fatty cuts of red and  white meats, such as rib eye steak, sausage, ham, and bacon. ? High-fat dairy items, such as whole milk, butter, and cream cheese.  Eat small, frequent meals instead of large meals.  Avoid drinking large amounts of liquid with your meals.  Avoid eating meals during the 2-3 hours before  bedtime.  Avoid lying down right after you eat.  Do not exercise right after you eat. General instructions  Pay attention to any changes in your symptoms.  Take over-the-counter and prescription medicines only as told by your health care provider. Do not take aspirin, ibuprofen, or other NSAIDs unless your health care provider told you to do so.  Do not use any tobacco products, including cigarettes, chewing tobacco, and e-cigarettes. If you need help quitting, ask your health care provider.  Wear loose-fitting clothing. Do not wear anything tight around your waist that causes pressure on your abdomen.  Raise (elevate) the head of your bed 6 inches (15cm).  Try to reduce your stress, such as with yoga or meditation. If you need help reducing stress, ask your health care provider.  If you are overweight, reduce your weight to an amount that is healthy for you. Ask your health care provider for guidance about a safe weight loss goal.  Keep all follow-up visits as told by your health care provider. This is important. Contact a health care provider if:  You have new symptoms.  You have unexplained weight loss.  You have difficulty swallowing, or it hurts to swallow.  You have wheezing or a persistent cough.  Your symptoms do not improve with treatment.  You have a hoarse voice. Get help right away if:  You have pain in your arms, neck, jaw, teeth, or back.  You feel sweaty, dizzy, or light-headed.  You have chest pain or shortness of breath.  You vomit and your vomit looks like blood or coffee grounds.  You faint.  Your stool is bloody or black.  You cannot swallow, drink, or eat. This information is not intended to replace advice given to you by your health care provider. Make sure you discuss any questions you have with your health care provider. Document Released: 12/22/2004 Document Revised: 08/12/2015 Document Reviewed: 07/09/2014 Elsevier Interactive Patient  Education  2018 [employer] for Gastroesophageal Reflux Disease, Adult When you have gastroesophageal reflux disease (GERD), the foods you eat and your eating habits are very important. Choosing the right foods can help ease your discomfort. What guidelines do I need to follow?  Choose fruits, vegetables, whole grains, and low-fat dairy products.  Choose low-fat meat, fish, and poultry.  Limit fats such as oils, salad dressings, butter, nuts, and avocado.  Keep a food diary. This helps you identify foods that cause symptoms.  Avoid foods that cause symptoms. These may be different for everyone.  Eat small meals often instead of 3 large meals a day.  Eat your meals slowly, in a place where you are relaxed.  Limit fried foods.  Cook foods using methods other than frying.  Avoid drinking alcohol.  Avoid drinking large amounts of liquids with your meals.  Avoid bending over or lying down until 2-3 hours after eating. What foods are not recommended? These are some foods and drinks that may make your symptoms worse: Vegetables Tomatoes. Tomato juice. Tomato and spaghetti sauce. Chili peppers. Onion and garlic. Horseradish. Fruits Oranges, grapefruit, and lemon (fruit and juice). Meats High-fat meats, fish, and poultry. This includes hot dogs, ribs, ham, sausage,  salami, and bacon. Dairy Whole milk and chocolate milk. Sour cream. Cream. Butter. Ice cream. Cream cheese. Drinks Coffee and tea. Bubbly (carbonated) drinks or energy drinks. Condiments Hot sauce. Barbecue sauce. Sweets/Desserts Chocolate and cocoa. Donuts. Peppermint and spearmint. Fats and Oils High-fat foods. This includes [place] fries and potato chips. Other Vinegar. Strong spices. This includes black pepper, white pepper, red pepper, cayenne, curry powder, cloves, ginger, and chili powder. The items listed above may not be a complete list of foods and drinks to avoid. Contact your dietitian  for more information. This information is not intended to replace advice given to you by your health care provider. Make sure you discuss any questions you have with your health care provider. Document Released: 09/13/2011 Document Revised: 08/20/2015 Document Reviewed: 01/16/2013 Elsevier Interactive Patient Education  2017 [employer].
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
## 2017-07-29 ENCOUNTER — Encounter: Payer: Self-pay | Admitting: Gastroenterology

## 2017-08-15 ENCOUNTER — Telehealth: Payer: Self-pay | Admitting: Gastroenterology

## 2017-08-15 NOTE — Telephone Encounter (Signed)
Patient advised. Patient mentions he is also worried about his weight. States he is losing weight. He has lost 2 pounds and he doesn't know why. He will see his PCP this week. Hopes to discuss this with PCP.

## 2017-08-15 NOTE — Telephone Encounter (Signed)
All meds have potential side effects as long as his kidney function is being monitor by his PMD ok to continue PPI and H2 blocker for management of acid reflux with significant symptoms.

## 2017-08-15 NOTE — Telephone Encounter (Signed)
He is taking Pepcid in the evenings. Takes Nexium every morning. GI symptoms are controlled. He noticed on the prescription bag a notice he should let his doctor know if he has "kidney problems."  He questions if Pepcid is safe for him.

## 2017-09-06 ENCOUNTER — Other Ambulatory Visit: Payer: Self-pay | Admitting: Gastroenterology

## 2017-09-07 ENCOUNTER — Telehealth: Payer: Self-pay | Admitting: Gastroenterology

## 2017-09-07 NOTE — Telephone Encounter (Signed)
Pt reports he had a cbc done last week and  His hgb was a little low. States the doctor wants him to do stool cards. Pt reports sometimes he strains and he see some blood from hemorrhoids. Pt reports he may need to take a stool softener to help with this. Discussed with him that he can take the stool softeners, they are not going to hurt him. Pt verbalized understanding.

## 2017-09-07 NOTE — Telephone Encounter (Signed)
Patient states he is still having soft stools and would like to speak with the nurse regarding gi issues.

## 2017-12-26 ENCOUNTER — Other Ambulatory Visit: Payer: Self-pay | Admitting: Psychiatry

## 2017-12-26 NOTE — Telephone Encounter (Signed)
Called patient, advised that we had sent RX to [facility] it was generic name, instead of brand name, per provider okay extra until brand name comes back into stock. Called in a month generic #150 with 1 additional refill.  Patient called, verbalized understanding.

## 2017-12-26 NOTE — Telephone Encounter (Signed)
Pt need refill on [facility]ant needs prescription to [facility] [place] need a written script to be sent to [facility] due to insurance

## 2018-01-11 ENCOUNTER — Other Ambulatory Visit: Payer: Self-pay | Admitting: Gastroenterology

## 2018-01-13 ENCOUNTER — Encounter: Payer: Self-pay | Admitting: Emergency Medicine

## 2018-01-13 DIAGNOSIS — F411 Generalized anxiety disorder: Secondary | ICD-10-CM | POA: Insufficient documentation

## 2018-01-13 DIAGNOSIS — F259 Schizoaffective disorder, unspecified: Secondary | ICD-10-CM | POA: Insufficient documentation

## 2018-01-25 ENCOUNTER — Ambulatory Visit: Payer: Self-pay | Admitting: Psychiatry

## 2018-02-01 ENCOUNTER — Other Ambulatory Visit: Payer: Self-pay | Admitting: Psychiatry

## 2018-02-01 MED ORDER — ATIVAN 0.5 MG PO TABS: 1.0000 mg | ORAL_TABLET | Freq: Four times a day (QID) | ORAL | 1 refills | Status: DC

## 2018-02-01 NOTE — Progress Notes (Signed)
Patient is asking for branded Ativan.  He has a long maintain that that is more effective for his anxiety than is generic.  Currently Ativan is not available and 1 mg strength in brand.  But it is not available in 0.5 mg strength and so that was called in

## 2018-02-08 ENCOUNTER — Other Ambulatory Visit: Payer: Self-pay | Admitting: Gastroenterology

## 2018-03-02 ENCOUNTER — Other Ambulatory Visit: Payer: Self-pay | Admitting: Gastroenterology

## 2018-03-15 ENCOUNTER — Encounter

## 2018-03-15 ENCOUNTER — Ambulatory Visit (INDEPENDENT_AMBULATORY_CARE_PROVIDER_SITE_OTHER): Payer: Medicare Other | Admitting: Psychiatry

## 2018-03-15 ENCOUNTER — Encounter: Payer: Self-pay | Admitting: Psychiatry

## 2018-03-15 DIAGNOSIS — F259 Schizoaffective disorder, unspecified: Secondary | ICD-10-CM | POA: Diagnosis not present

## 2018-03-15 DIAGNOSIS — F4001 Agoraphobia with panic disorder: Secondary | ICD-10-CM

## 2018-03-15 DIAGNOSIS — G3184 Mild cognitive impairment, so stated: Secondary | ICD-10-CM | POA: Diagnosis not present

## 2018-03-15 DIAGNOSIS — F411 Generalized anxiety disorder: Secondary | ICD-10-CM | POA: Diagnosis not present

## 2018-03-15 DIAGNOSIS — Z87820 Personal history of traumatic brain injury: Secondary | ICD-10-CM
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
## 2018-03-30 ENCOUNTER — Other Ambulatory Visit: Payer: Self-pay

## 2018-03-30 MED ORDER — LORAZEPAM 2 MG PO TABS: ORAL_TABLET | ORAL | 4 refills | Status: DC

## 2018-04-04 ENCOUNTER — Other Ambulatory Visit: Payer: Self-pay | Admitting: Gastroenterology

## 2018-04-19 ENCOUNTER — Ambulatory Visit (HOSPITAL_COMMUNITY)
Admission: RE | Admit: 2018-04-19 | Discharge: 2018-04-19 | Disposition: A | Discharge status: Home / Self Care | Payer: Medicare Other | Source: Ambulatory Visit | Attending: Family Medicine | Admitting: Family Medicine

## 2018-04-19 ENCOUNTER — Other Ambulatory Visit (HOSPITAL_COMMUNITY): Payer: Self-pay | Admitting: Family Medicine

## 2018-04-19 DIAGNOSIS — M542 Cervicalgia: Secondary | ICD-10-CM

## 2018-05-04 ENCOUNTER — Other Ambulatory Visit: Payer: Self-pay

## 2018-05-04 MED ORDER — CHLORPROMAZINE HCL 100 MG PO TABS: 100.0000 mg | ORAL_TABLET | Freq: Four times a day (QID) | ORAL | 2 refills | Status: DC

## 2018-05-04 MED ORDER — CARBAMAZEPINE 200 MG PO TABS: 100.0000 mg | ORAL_TABLET | Freq: Four times a day (QID) | ORAL | 2 refills | Status: DC

## 2018-05-04 MED ORDER — TRIHEXYPHENIDYL HCL 2 MG PO TABS: 2.0000 mg | ORAL_TABLET | Freq: Two times a day (BID) | ORAL | 2 refills | Status: DC

## 2018-06-02 ENCOUNTER — Other Ambulatory Visit: Payer: Self-pay | Admitting: Gastroenterology

## 2018-06-14 ENCOUNTER — Ambulatory Visit: Payer: Medicare Other | Admitting: Psychiatry

## 2018-06-26 ENCOUNTER — Other Ambulatory Visit: Payer: Self-pay | Admitting: Psychiatry

## 2018-06-26 MED ORDER — CARBAMAZEPINE 200 MG PO TABS: 100.0000 mg | ORAL_TABLET | Freq: Four times a day (QID) | ORAL | 3 refills | Status: DC

## 2018-06-26 MED ORDER — TRIHEXYPHENIDYL HCL 2 MG PO TABS: 2.0000 mg | ORAL_TABLET | Freq: Two times a day (BID) | ORAL | 1 refills | Status: DC

## 2018-07-02 ENCOUNTER — Other Ambulatory Visit: Payer: Self-pay | Admitting: Gastroenterology

## 2018-07-12 ENCOUNTER — Ambulatory Visit (INDEPENDENT_AMBULATORY_CARE_PROVIDER_SITE_OTHER): Payer: Medicare Other | Admitting: Psychiatry

## 2018-07-12 ENCOUNTER — Encounter: Payer: Self-pay | Admitting: Psychiatry

## 2018-07-12 ENCOUNTER — Other Ambulatory Visit: Payer: Self-pay

## 2018-07-12 DIAGNOSIS — F4001 Agoraphobia with panic disorder: Secondary | ICD-10-CM | POA: Diagnosis not present

## 2018-07-12 DIAGNOSIS — Z87820 Personal history of traumatic brain injury: Secondary | ICD-10-CM

## 2018-07-12 DIAGNOSIS — G3184 Mild cognitive impairment, so stated: Secondary | ICD-10-CM

## 2018-07-12 DIAGNOSIS — F259 Schizoaffective disorder, unspecified: Secondary | ICD-10-CM

## 2018-07-12 DIAGNOSIS — F411 Generalized anxiety disorder: Secondary | ICD-10-CM
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
## 2018-08-01 ENCOUNTER — Other Ambulatory Visit: Payer: Self-pay | Admitting: Gastroenterology

## 2018-08-03 ENCOUNTER — Other Ambulatory Visit: Payer: Self-pay

## 2018-08-03 ENCOUNTER — Telehealth: Payer: Self-pay | Admitting: Psychiatry

## 2018-08-03 MED ORDER — CHLORPROMAZINE HCL 100 MG PO TABS: 100.0000 mg | ORAL_TABLET | Freq: Four times a day (QID) | ORAL | 2 refills | Status: DC

## 2018-08-03 NOTE — Telephone Encounter (Signed)
Pharmacy called and asked a refill on his chlopromazine 100mg  needs to be sent to [facility]

## 2018-08-03 NOTE — Telephone Encounter (Signed)
Submitted per request

## 2018-08-31 ENCOUNTER — Other Ambulatory Visit: Payer: Self-pay | Admitting: Gastroenterology

## 2018-08-31 ENCOUNTER — Other Ambulatory Visit: Payer: Self-pay | Admitting: Psychiatry

## 2018-08-31 NOTE — Telephone Encounter (Signed)
Due back in August last fill 05/06

## 2018-09-03 ENCOUNTER — Telehealth: Payer: Self-pay | Admitting: Psychiatry

## 2018-09-03 ENCOUNTER — Other Ambulatory Visit: Payer: Self-pay | Admitting: Psychiatry

## 2018-09-03 MED ORDER — ATIVAN 2 MG PO TABS: ORAL_TABLET | ORAL | 5 refills | Status: DC

## 2018-09-03 NOTE — Telephone Encounter (Signed)
Called into his pharmacy

## 2018-09-03 NOTE — Progress Notes (Signed)
Ativan refill was done as 2 mg 1/2 tablet 4 times daily with 5 refills

## 2018-09-03 NOTE — Telephone Encounter (Signed)
Pt needs rx sent ativan 2mg  sent to [facility] home care.

## 2018-09-25 ENCOUNTER — Other Ambulatory Visit: Payer: Self-pay

## 2018-09-26 ENCOUNTER — Encounter: Payer: Self-pay | Admitting: Physician Assistant

## 2018-09-26 ENCOUNTER — Encounter (INDEPENDENT_AMBULATORY_CARE_PROVIDER_SITE_OTHER): Payer: Self-pay

## 2018-09-26 ENCOUNTER — Ambulatory Visit (INDEPENDENT_AMBULATORY_CARE_PROVIDER_SITE_OTHER): Payer: Medicare Other | Admitting: Physician Assistant

## 2018-09-26 VITALS — BP 139/81 | HR 80 | Temp 98.8°F | Ht 76.0 in | Wt 190.4 lb

## 2018-09-26 DIAGNOSIS — I10 Essential (primary) hypertension: Secondary | ICD-10-CM | POA: Diagnosis not present

## 2018-09-26 DIAGNOSIS — K2270 Barrett's esophagus without dysplasia: Secondary | ICD-10-CM

## 2018-09-26 DIAGNOSIS — K2100 Gastro-esophageal reflux disease with esophagitis, without bleeding: Secondary | ICD-10-CM

## 2018-09-26 DIAGNOSIS — E538 Deficiency of other specified B group vitamins: Secondary | ICD-10-CM

## 2018-09-26 DIAGNOSIS — Z0000 Encounter for general adult medical examination without abnormal findings: Secondary | ICD-10-CM

## 2018-09-26 DIAGNOSIS — Z79899 Other long term (current) drug therapy: Secondary | ICD-10-CM

## 2018-09-26 DIAGNOSIS — F411 Generalized anxiety disorder: Secondary | ICD-10-CM

## 2018-09-26 DIAGNOSIS — F259 Schizoaffective disorder, unspecified: Secondary | ICD-10-CM
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
## 2018-09-27 NOTE — Progress Notes (Signed)
Agreed.  No meds need to be changed.

## 2018-09-28 ENCOUNTER — Other Ambulatory Visit: Payer: Self-pay | Admitting: Gastroenterology

## 2018-09-28 LAB — CBC WITH DIFFERENTIAL/PLATELET
Basophils Absolute: 0.1 10*3/uL (ref 0.0–0.2)
Basos: 1 %
EOS (ABSOLUTE): 0.1 10*3/uL (ref 0.0–0.4)
Eos: 2 %
Hematocrit: 35.6 % — ABNORMAL LOW (ref 37.5–51.0)
Hemoglobin: 12.2 g/dL — ABNORMAL LOW (ref 13.0–17.7)
Immature Grans (Abs): 0 10*3/uL (ref 0.0–0.1)
Immature Granulocytes: 0 %
Lymphocytes Absolute: 0.8 10*3/uL (ref 0.7–3.1)
Lymphs: 13 %
MCH: 29.3 pg (ref 26.6–33.0)
MCHC: 34.3 g/dL (ref 31.5–35.7)
MCV: 85 fL (ref 79–97)
Monocytes Absolute: 0.6 10*3/uL (ref 0.1–0.9)
Monocytes: 9 %
Neutrophils Absolute: 4.8 10*3/uL (ref 1.4–7.0)
Neutrophils: 75 %
Platelets: 199 10*3/uL (ref 150–450)
RBC: 4.17 x10E6/uL (ref 4.14–5.80)
RDW: 12.8 % (ref 11.6–15.4)
WBC: 6.3 10*3/uL (ref 3.4–10.8)

## 2018-09-28 LAB — CMP14+EGFR
ALT: 26 IU/L (ref 0–44)
AST: 23 IU/L (ref 0–40)
Albumin/Globulin Ratio: 1.7 (ref 1.2–2.2)
Albumin: 4.5 g/dL (ref 3.8–4.8)
Alkaline Phosphatase: 129 IU/L — ABNORMAL HIGH (ref 39–117)
BUN/Creatinine Ratio: 15 (ref 10–24)
BUN: 24 mg/dL (ref 8–27)
Bilirubin Total: 0.2 mg/dL (ref 0.0–1.2)
CO2: 20 mmol/L (ref 20–29)
Calcium: 9.2 mg/dL (ref 8.6–10.2)
Chloride: 104 mmol/L (ref 96–106)
Creatinine, Ser: 1.62 mg/dL — ABNORMAL HIGH (ref 0.76–1.27)
GFR calc Af Amer: 50 mL/min/{1.73_m2} — ABNORMAL LOW (ref >59)
GFR calc non Af Amer: 44 mL/min/{1.73_m2} — ABNORMAL LOW (ref >59)
Globulin, Total: 2.7 g/dL (ref 1.5–4.5)
Glucose: 113 mg/dL — ABNORMAL HIGH (ref 65–99)
Potassium: 4.5 mmol/L (ref 3.5–5.2)
Sodium: 139 mmol/L (ref 134–144)
Total Protein: 7.2 g/dL (ref 6.0–8.5)

## 2018-09-28 LAB — LIPID PANEL
Chol/HDL Ratio: 2.6 ratio (ref 0.0–5.0)
Cholesterol, Total: 161 mg/dL (ref 100–199)
HDL: 61 mg/dL (ref >39)
LDL Calculated: 87 mg/dL (ref 0–99)
Triglycerides: 63 mg/dL (ref 0–149)
VLDL Cholesterol Cal: 13 mg/dL (ref 5–40)

## 2018-09-28 LAB — PSA: Prostate Specific Ag, Serum: 1.1 ng/mL (ref 0.0–4.0)

## 2018-09-28 LAB — CARBAMAZEPINE, FREE AND TOTAL
Carbamazepine, Free: 1.5 ug/mL (ref 0.6–4.2)
Carbamazepine, Total: 6.9 ug/mL (ref 4.0–12.0)

## 2018-09-28 LAB — TSH: TSH: 3 u[IU]/mL (ref 0.450–4.500)

## 2018-10-03 ENCOUNTER — Other Ambulatory Visit: Payer: Self-pay | Admitting: *Deleted

## 2018-10-03 MED ORDER — AMLODIPINE BESYLATE 10 MG PO TABS: 5.0000 mg | ORAL_TABLET | Freq: Every evening | ORAL | 3 refills | Status: DC

## 2018-10-03 MED ORDER — FLUTICASONE PROPIONATE 50 MCG/ACT NA SUSP: NASAL | 3 refills | Status: DC

## 2018-10-23 ENCOUNTER — Other Ambulatory Visit: Payer: Self-pay

## 2018-10-24 ENCOUNTER — Ambulatory Visit (INDEPENDENT_AMBULATORY_CARE_PROVIDER_SITE_OTHER): Payer: Medicare Other

## 2018-10-24 ENCOUNTER — Telehealth: Payer: Self-pay

## 2018-10-24 DIAGNOSIS — E538 Deficiency of other specified B group vitamins: Secondary | ICD-10-CM

## 2018-10-24 MED ORDER — CYANOCOBALAMIN 1000 MCG/ML IJ SOLN
1000.0000 ug | INTRAMUSCULAR | Status: DC
  Administered 2018-10-24 – 2019-01-25 (×3): 1000 ug via INTRAMUSCULAR

## 2018-10-24 NOTE — Telephone Encounter (Signed)
Patient notified and verbalized understanding. 

## 2018-10-24 NOTE — Telephone Encounter (Signed)
Please increase the norvasc to 10 mg daily, take the whole tab. Report back to [place] on how it does.
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
## 2018-10-25 ENCOUNTER — Other Ambulatory Visit: Payer: Self-pay

## 2018-10-25 ENCOUNTER — Ambulatory Visit (INDEPENDENT_AMBULATORY_CARE_PROVIDER_SITE_OTHER): Payer: Medicare Other

## 2018-10-25 VITALS — BP 138/86

## 2018-10-25 DIAGNOSIS — I10 Essential (primary) hypertension: Secondary | ICD-10-CM

## 2018-10-25 NOTE — Progress Notes (Signed)
Patient doubled up on norvasc last night and wanted to have BP checked today. Todays reading is 138/86. He seems to be much more relaxed today. Advised patient to keep a check on BP and contact the office if he has any changes. Patient verbalized understanding.

## 2018-10-31 ENCOUNTER — Other Ambulatory Visit: Payer: Self-pay | Admitting: Gastroenterology

## 2018-10-31 ENCOUNTER — Other Ambulatory Visit: Payer: Self-pay | Admitting: Psychiatry

## 2018-11-01 ENCOUNTER — Ambulatory Visit: Payer: Medicare Other | Admitting: *Deleted

## 2018-11-01 ENCOUNTER — Other Ambulatory Visit: Payer: Self-pay

## 2018-11-01 DIAGNOSIS — Z0130 Encounter for examination of blood pressure without abnormal findings: Secondary | ICD-10-CM

## 2018-11-01 NOTE — Progress Notes (Signed)
Pt here for BP check BP 135 71 P 83

## 2018-11-02 ENCOUNTER — Encounter: Payer: Self-pay | Admitting: Physician Assistant

## 2018-11-02 ENCOUNTER — Ambulatory Visit (INDEPENDENT_AMBULATORY_CARE_PROVIDER_SITE_OTHER): Payer: Medicare Other | Admitting: Physician Assistant

## 2018-11-02 VITALS — BP 139/74 | HR 75 | Temp 98.2°F | Ht 76.0 in | Wt 187.0 lb

## 2018-11-02 DIAGNOSIS — I10 Essential (primary) hypertension: Secondary | ICD-10-CM | POA: Diagnosis not present
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
## 2018-11-02 NOTE — Patient Instructions (Addendum)
Only take amlodipine 5 mg daily in the evening  If the blood pressure stays 140/90 or higher for 4 days in a row, start taking losartan 100 mg (whole pill)

## 2018-11-05 ENCOUNTER — Other Ambulatory Visit: Payer: Self-pay

## 2018-11-05 ENCOUNTER — Ambulatory Visit (INDEPENDENT_AMBULATORY_CARE_PROVIDER_SITE_OTHER): Payer: Medicare Other

## 2018-11-05 DIAGNOSIS — Z0130 Encounter for examination of blood pressure without abnormal findings: Secondary | ICD-10-CM

## 2018-11-05 NOTE — Progress Notes (Signed)
Patient came in for a bp check . BP- 168/80 P-80 Patient states that he has been taking lostaran 50 mg and Angel told him to take it for 4 days and if his bp was still running high to increase to 100mg . Patient states that today was day 3 and he went up to 100mg .  States that since taking the 100mg  he "felt like the tv was distant and he felt off'.  States he does not want to feel that way so patient would like to stay on the 50mg .  Patient also states he would like a new rx for 50mg  sent to [facility]. Please advise and send to pools.

## 2018-11-06 ENCOUNTER — Other Ambulatory Visit: Payer: Self-pay | Admitting: Physician Assistant

## 2018-11-06 ENCOUNTER — Ambulatory Visit (INDEPENDENT_AMBULATORY_CARE_PROVIDER_SITE_OTHER): Payer: Medicare Other | Admitting: Psychiatry

## 2018-11-06 ENCOUNTER — Encounter: Payer: Self-pay | Admitting: Psychiatry

## 2018-11-06 DIAGNOSIS — F259 Schizoaffective disorder, unspecified: Secondary | ICD-10-CM | POA: Diagnosis not present

## 2018-11-06 DIAGNOSIS — Z87820 Personal history of traumatic brain injury: Secondary | ICD-10-CM

## 2018-11-06 DIAGNOSIS — G3184 Mild cognitive impairment, so stated: Secondary | ICD-10-CM

## 2018-11-06 DIAGNOSIS — F411 Generalized anxiety disorder: Secondary | ICD-10-CM | POA: Diagnosis not present

## 2018-11-06 DIAGNOSIS — F4001 Agoraphobia with panic disorder: Secondary | ICD-10-CM | POA: Diagnosis not present

## 2018-11-06 MED ORDER — LOSARTAN POTASSIUM 50 MG PO TABS: 50.0000 mg | ORAL_TABLET | Freq: Every morning | ORAL | 11 refills | Status: DC
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
## 2018-11-06 NOTE — Progress Notes (Signed)
Script sent to pharmacy.  Losartan 50 mg Can let patient know.

## 2018-11-07 ENCOUNTER — Ambulatory Visit (INDEPENDENT_AMBULATORY_CARE_PROVIDER_SITE_OTHER): Payer: Medicare Other | Admitting: Physician Assistant

## 2018-11-07 ENCOUNTER — Encounter: Payer: Self-pay | Admitting: Physician Assistant

## 2018-11-07 ENCOUNTER — Other Ambulatory Visit: Payer: Self-pay

## 2018-11-07 VITALS — BP 153/70 | HR 79 | Temp 96.8°F | Ht 76.0 in | Wt 189.0 lb

## 2018-11-07 DIAGNOSIS — N189 Chronic kidney disease, unspecified: Secondary | ICD-10-CM | POA: Diagnosis not present

## 2018-11-07 DIAGNOSIS — R339 Retention of urine, unspecified: Secondary | ICD-10-CM | POA: Diagnosis not present

## 2018-11-07 DIAGNOSIS — I10 Essential (primary) hypertension: Secondary | ICD-10-CM | POA: Diagnosis not present

## 2018-11-07 LAB — URINALYSIS, COMPLETE
Bilirubin, UA: NEGATIVE
Glucose, UA: NEGATIVE
Ketones, UA: NEGATIVE
Leukocytes,UA: NEGATIVE
Nitrite, UA: NEGATIVE
Protein,UA: NEGATIVE
RBC, UA: NEGATIVE
Specific Gravity, UA: 1.01 (ref 1.005–1.030)
Urobilinogen, Ur: 0.2 mg/dL (ref 0.2–1.0)
pH, UA: 6.5 (ref 5.0–7.5)

## 2018-11-07 LAB — MICROSCOPIC EXAMINATION
Bacteria, UA: NONE SEEN
Epithelial Cells (non renal): NONE SEEN /hpf (ref 0–10)
RBC, Urine: NONE SEEN /hpf (ref 0–2)
Renal Epithel, UA: NONE SEEN /hpf

## 2018-11-07 NOTE — Progress Notes (Signed)
Patient had a phone call with Glenard Haring today. This encounter will be closed.

## 2018-11-07 NOTE — Progress Notes (Signed)
Patient aware, script is ready. 

## 2018-11-08 ENCOUNTER — Telehealth: Payer: Self-pay | Admitting: Physician Assistant

## 2018-11-08 LAB — URINE CULTURE: Organism ID, Bacteria: NO GROWTH

## 2018-11-08 NOTE — Telephone Encounter (Signed)
Please advise 

## 2018-11-11 ENCOUNTER — Encounter: Payer: Self-pay | Admitting: Physician Assistant
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
## 2018-11-13 ENCOUNTER — Ambulatory Visit: Payer: Medicare Other | Admitting: *Deleted

## 2018-11-13 ENCOUNTER — Other Ambulatory Visit: Payer: Self-pay

## 2018-11-14 ENCOUNTER — Ambulatory Visit (INDEPENDENT_AMBULATORY_CARE_PROVIDER_SITE_OTHER): Payer: Medicare Other | Admitting: Physician Assistant

## 2018-11-14 ENCOUNTER — Ambulatory Visit: Payer: Medicare Other | Admitting: Physician Assistant

## 2018-11-14 ENCOUNTER — Encounter: Payer: Self-pay | Admitting: Physician Assistant

## 2018-11-14 VITALS — BP 142/62 | HR 83 | Temp 96.8°F | Ht 76.0 in | Wt 191.2 lb

## 2018-11-14 DIAGNOSIS — I10 Essential (primary) hypertension: Secondary | ICD-10-CM

## 2018-11-14 MED ORDER — LOSARTAN POTASSIUM 100 MG PO TABS: 100.0000 mg | ORAL_TABLET | Freq: Every morning | ORAL | 11 refills | Status: DC

## 2018-11-16 ENCOUNTER — Encounter: Payer: Self-pay | Admitting: Physician Assistant
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
## 2018-11-23 ENCOUNTER — Telehealth: Payer: Self-pay | Admitting: Physician Assistant

## 2018-11-26 ENCOUNTER — Ambulatory Visit: Payer: Medicare Other

## 2018-11-26 ENCOUNTER — Other Ambulatory Visit: Payer: Self-pay

## 2018-12-06 ENCOUNTER — Telehealth: Payer: Self-pay | Admitting: Psychiatry

## 2018-12-06 NOTE — Telephone Encounter (Signed)
Received call from pt after hours. Pt reports that he missed several doses of his medications due to falling asleep before taking meds scheduled for dinner and bedtime. He reports that he thought he would take his breakfast medications now. Asked pt if he has slept all day today and just woke up. Pt then asks what time it is, and informed him that it was almost 7 pm. Pt reports that he thought it was morning. Provided re-orientation and pt reports that he did not miss evening and HS meds afterall and had taken a long nap this afternoon. Discussed that he can continue regular medication schedule. Pt verbalized feeling relieved and less anxious after learning he had not missed any medication.

## 2018-12-13 ENCOUNTER — Other Ambulatory Visit: Payer: Self-pay

## 2018-12-14 ENCOUNTER — Encounter: Payer: Self-pay | Admitting: Physician Assistant

## 2018-12-14 ENCOUNTER — Ambulatory Visit (INDEPENDENT_AMBULATORY_CARE_PROVIDER_SITE_OTHER): Payer: Medicare Other | Admitting: Physician Assistant

## 2018-12-14 VITALS — BP 124/67 | HR 88 | Temp 97.1°F | Ht 76.0 in | Wt 187.4 lb

## 2018-12-14 DIAGNOSIS — K2100 Gastro-esophageal reflux disease with esophagitis, without bleeding: Secondary | ICD-10-CM

## 2018-12-14 DIAGNOSIS — I10 Essential (primary) hypertension: Secondary | ICD-10-CM | POA: Diagnosis not present
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
## 2018-12-17 ENCOUNTER — Other Ambulatory Visit: Payer: Self-pay | Admitting: Physician Assistant

## 2018-12-17 ENCOUNTER — Telehealth: Payer: Self-pay | Admitting: Physician Assistant

## 2018-12-17 DIAGNOSIS — N189 Chronic kidney disease, unspecified: Secondary | ICD-10-CM

## 2018-12-17 DIAGNOSIS — Z79899 Other long term (current) drug therapy: Secondary | ICD-10-CM

## 2018-12-17 DIAGNOSIS — I10 Essential (primary) hypertension: Secondary | ICD-10-CM

## 2018-12-17 NOTE — Telephone Encounter (Signed)
Patient aware.

## 2018-12-17 NOTE — Telephone Encounter (Signed)
Order placed

## 2018-12-18 ENCOUNTER — Ambulatory Visit (INDEPENDENT_AMBULATORY_CARE_PROVIDER_SITE_OTHER): Payer: Medicare Other | Admitting: *Deleted

## 2018-12-18 ENCOUNTER — Other Ambulatory Visit: Payer: Self-pay

## 2018-12-18 DIAGNOSIS — Z0000 Encounter for general adult medical examination without abnormal findings: Secondary | ICD-10-CM | POA: Diagnosis not present

## 2018-12-18 NOTE — Patient Instructions (Signed)
Preventive Care 75 Years and Older, Male Preventive care refers to lifestyle choices and visits with your health care provider that can promote health and wellness. This includes:  A yearly physical exam. This is also called an annual well check.  Regular dental and eye exams.  Immunizations.  Screening for certain conditions.  Healthy lifestyle choices, such as diet and exercise. What can I expect for my preventive care visit? Physical exam Your health care provider will check:  Height and weight. These may be used to calculate body mass index (BMI), which is a measurement that tells if you are at a healthy weight.  Heart rate and blood pressure.  Your skin for abnormal spots. Counseling Your health care provider may ask you questions about:  Alcohol, tobacco, and drug use.  Emotional well-being.  Home and relationship well-being.  Sexual activity.  Eating habits.  History of falls.  Memory and ability to understand (cognition).  Work and work Statistician. What immunizations do I need?  Influenza (flu) vaccine  This is recommended every year. Tetanus, diphtheria, and pertussis (Tdap) vaccine  You may need a Td booster every 10 years. Varicella (chickenpox) vaccine  You may need this vaccine if you have not already been vaccinated. Zoster (shingles) vaccine  You may need this after age 50. Pneumococcal conjugate (PCV13) vaccine  One dose is recommended after age 24. Pneumococcal polysaccharide (PPSV23) vaccine  One dose is recommended after age 33. Measles, mumps, and rubella (MMR) vaccine  You may need at least one dose of MMR if you were born in 1957 or later. You may also need a second dose. Meningococcal conjugate (MenACWY) vaccine  You may need this if you have certain conditions. Hepatitis A vaccine  You may need this if you have certain conditions or if you travel or work in places where you may be exposed to hepatitis A. Hepatitis B vaccine   You may need this if you have certain conditions or if you travel or work in places where you may be exposed to hepatitis B. Haemophilus influenzae type b (Hib) vaccine  You may need this if you have certain conditions. You may receive vaccines as individual doses or as more than one vaccine together in one shot (combination vaccines). Talk with your health care provider about the risks and benefits of combination vaccines. What tests do I need? Blood tests  Lipid and cholesterol levels. These may be checked every 5 years, or more frequently depending on your overall health.  Hepatitis C test.  Hepatitis B test. Screening  Lung cancer screening. You may have this screening every year starting at age 74 if you have a 30-pack-year history of smoking and currently smoke or have quit within the past 15 years.  Colorectal cancer screening. All adults should have this screening starting at age 57 and continuing until age 54. Your health care provider may recommend screening at age 47 if you are at increased risk. You will have tests every 1-10 years, depending on your results and the type of screening test.  Prostate cancer screening. Recommendations will vary depending on your family history and other risks.  Diabetes screening. This is done by checking your blood sugar (glucose) after you have not eaten for a while (fasting). You may have this done every 1-3 years.  Abdominal aortic aneurysm (AAA) screening. You may need this if you are a current or former smoker.  Sexually transmitted disease (STD) testing. Follow these instructions at home: Eating and drinking  Eat  a diet that includes fresh fruits and vegetables, whole grains, lean protein, and low-fat dairy products. Limit your intake of foods with high amounts of sugar, saturated fats, and salt.  Take vitamin and mineral supplements as recommended by your health care provider.  Do not drink alcohol if your health care provider  tells you not to drink.  If you drink alcohol: ? Limit how much you have to 0-2 drinks a day. ? Be aware of how much alcohol is in your drink. In the [place] one drink equals one 12 oz bottle of beer (355 mL), one 5 oz glass of wine (148 mL), or one 1 oz glass of hard liquor (44 mL). Lifestyle  Take daily care of your teeth and gums.  Stay active. Exercise for at least 30 minutes on 5 or more days each week.  Do not use any products that contain nicotine or tobacco, such as cigarettes, e-cigarettes, and chewing tobacco. If you need help quitting, ask your health care provider.  If you are sexually active, practice safe sex. Use a condom or other form of protection to prevent STIs (sexually transmitted infections).  Talk with your health care provider about taking a low-dose aspirin or statin. What's next?  Visit your health care provider once a year for a well check visit.  Ask your health care provider how often you should have your eyes and teeth checked.  Stay up to date on all vaccines. This information is not intended to replace advice given to you by your health care provider. Make sure you discuss any questions you have with your health care provider. Document Released: 04/10/2015 Document Revised: 03/08/2018 Document Reviewed: 03/08/2018 Elsevier Patient Education  2020 Elsevier Inc.  
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
## 2018-12-25 ENCOUNTER — Other Ambulatory Visit: Payer: Self-pay

## 2018-12-26 ENCOUNTER — Ambulatory Visit (INDEPENDENT_AMBULATORY_CARE_PROVIDER_SITE_OTHER): Payer: Medicare Other

## 2018-12-26 DIAGNOSIS — E538 Deficiency of other specified B group vitamins: Secondary | ICD-10-CM | POA: Diagnosis not present

## 2018-12-26 NOTE — Progress Notes (Signed)
Cyanocobalamin injection given to left deltoid.  Patient tolerated well. 

## 2018-12-31 ENCOUNTER — Other Ambulatory Visit: Payer: Self-pay | Admitting: Gastroenterology

## 2018-12-31 ENCOUNTER — Other Ambulatory Visit: Payer: Self-pay | Admitting: Psychiatry

## 2019-01-02 ENCOUNTER — Other Ambulatory Visit: Payer: Self-pay | Admitting: Gastroenterology

## 2019-01-07 ENCOUNTER — Telehealth: Payer: Self-pay | Admitting: Physician Assistant

## 2019-01-07 ENCOUNTER — Other Ambulatory Visit: Payer: Self-pay | Admitting: Physician Assistant

## 2019-01-07 MED ORDER — CEPHALEXIN 250 MG PO CAPS: 250.0000 mg | ORAL_CAPSULE | Freq: Three times a day (TID) | ORAL | 0 refills | Status: DC

## 2019-01-07 NOTE — Telephone Encounter (Signed)
Patient is concerned because he got scratched by his cat this morning and the cat is past due for a rabies vaccine.  He cleaned the area well with peroxide.  Is there anything else that he should do, does he need antibiotic?  Uses [facility].

## 2019-01-07 NOTE — Telephone Encounter (Signed)
At this time keep it clean and wash.  I will send an antibiotic in.  We do not have to worry about rabies just from a scrape.  Unless the cat was acting in a very unusual way we do not worry about rabies.  I will send the medication on to the pharmacy

## 2019-01-08 ENCOUNTER — Other Ambulatory Visit: Payer: Self-pay | Admitting: Physician Assistant

## 2019-01-08 MED ORDER — DOXYCYCLINE HYCLATE 100 MG PO TABS: 100.0000 mg | ORAL_TABLET | Freq: Two times a day (BID) | ORAL | 0 refills | Status: DC

## 2019-01-08 NOTE — Telephone Encounter (Signed)
Patient aware.  Allergy added to list

## 2019-01-08 NOTE — Telephone Encounter (Signed)
I have sent doxycycline to the pharmacy and taken cephalexin off of his medication list.  I was unable to add to the allergy list under the orders only tab.  Can you add that in on his history?

## 2019-01-08 NOTE — Telephone Encounter (Signed)
Patient informed about Keflex.  He reports he has taken this in the past and it caused swelling in his hands so he does not want to take this.  He has taken Doxycycline in the past with no problems and would take that if it was sent in.

## 2019-01-23 ENCOUNTER — Other Ambulatory Visit: Payer: Self-pay

## 2019-01-25 ENCOUNTER — Other Ambulatory Visit: Payer: Self-pay

## 2019-01-25 ENCOUNTER — Ambulatory Visit (INDEPENDENT_AMBULATORY_CARE_PROVIDER_SITE_OTHER): Payer: Medicare Other

## 2019-01-25 DIAGNOSIS — E538 Deficiency of other specified B group vitamins: Secondary | ICD-10-CM | POA: Diagnosis not present

## 2019-01-25 DIAGNOSIS — N189 Chronic kidney disease, unspecified: Secondary | ICD-10-CM

## 2019-01-25 DIAGNOSIS — I10 Essential (primary) hypertension: Secondary | ICD-10-CM

## 2019-01-25 DIAGNOSIS — Z79899 Other long term (current) drug therapy: Secondary | ICD-10-CM

## 2019-01-25 NOTE — Progress Notes (Signed)
Cyanocobalamin injection given to left deltoid.  Patient was to have in right arm but refused.  Patient tolerated well.

## 2019-01-26 LAB — CBC WITH DIFFERENTIAL/PLATELET
Basophils Absolute: 0.1 10*3/uL (ref 0.0–0.2)
Basos: 1 %
EOS (ABSOLUTE): 0.2 10*3/uL (ref 0.0–0.4)
Eos: 3 %
Hematocrit: 34.9 % — ABNORMAL LOW (ref 37.5–51.0)
Hemoglobin: 11.8 g/dL — ABNORMAL LOW (ref 13.0–17.7)
Immature Grans (Abs): 0 10*3/uL (ref 0.0–0.1)
Immature Granulocytes: 0 %
Lymphocytes Absolute: 1.3 10*3/uL (ref 0.7–3.1)
Lymphs: 23 %
MCH: 29.7 pg (ref 26.6–33.0)
MCHC: 33.8 g/dL (ref 31.5–35.7)
MCV: 88 fL (ref 79–97)
Monocytes Absolute: 0.5 10*3/uL (ref 0.1–0.9)
Monocytes: 10 %
Neutrophils Absolute: 3.5 10*3/uL (ref 1.4–7.0)
Neutrophils: 63 %
Platelets: 184 10*3/uL (ref 150–450)
RBC: 3.97 x10E6/uL — ABNORMAL LOW (ref 4.14–5.80)
RDW: 12.6 % (ref 11.6–15.4)
WBC: 5.6 10*3/uL (ref 3.4–10.8)

## 2019-01-26 LAB — CMP14+EGFR
ALT: 22 IU/L (ref 0–44)
AST: 24 IU/L (ref 0–40)
Albumin/Globulin Ratio: 1.5 (ref 1.2–2.2)
Albumin: 4.3 g/dL (ref 3.8–4.8)
Alkaline Phosphatase: 126 IU/L — ABNORMAL HIGH (ref 39–117)
BUN/Creatinine Ratio: 11 (ref 10–24)
BUN: 20 mg/dL (ref 8–27)
Bilirubin Total: 0.2 mg/dL (ref 0.0–1.2)
CO2: 22 mmol/L (ref 20–29)
Calcium: 9.3 mg/dL (ref 8.6–10.2)
Chloride: 104 mmol/L (ref 96–106)
Creatinine, Ser: 1.77 mg/dL — ABNORMAL HIGH (ref 0.76–1.27)
GFR calc Af Amer: 45 mL/min/{1.73_m2} — ABNORMAL LOW (ref >59)
GFR calc non Af Amer: 39 mL/min/{1.73_m2} — ABNORMAL LOW (ref >59)
Globulin, Total: 2.9 g/dL (ref 1.5–4.5)
Glucose: 117 mg/dL — ABNORMAL HIGH (ref 65–99)
Potassium: 4.5 mmol/L (ref 3.5–5.2)
Sodium: 140 mmol/L (ref 134–144)
Total Protein: 7.2 g/dL (ref 6.0–8.5)

## 2019-01-26 LAB — CARBAMAZEPINE LEVEL, TOTAL: Carbamazepine (Tegretol), S: 7.3 ug/mL (ref 4.0–12.0)

## 2019-01-26 LAB — TSH: TSH: 2.78 u[IU]/mL (ref 0.450–4.500)

## 2019-01-28 ENCOUNTER — Other Ambulatory Visit: Payer: Self-pay

## 2019-01-28 ENCOUNTER — Other Ambulatory Visit: Payer: Self-pay | Admitting: Psychiatry

## 2019-01-29 ENCOUNTER — Encounter: Payer: Self-pay | Admitting: Physician Assistant

## 2019-01-29 ENCOUNTER — Ambulatory Visit (INDEPENDENT_AMBULATORY_CARE_PROVIDER_SITE_OTHER): Payer: Medicare Other | Admitting: Physician Assistant

## 2019-01-29 VITALS — BP 121/68 | HR 70 | Temp 97.7°F | Ht 76.0 in | Wt 187.8 lb

## 2019-01-29 DIAGNOSIS — I10 Essential (primary) hypertension: Secondary | ICD-10-CM

## 2019-01-29 DIAGNOSIS — F259 Schizoaffective disorder, unspecified: Secondary | ICD-10-CM

## 2019-01-29 DIAGNOSIS — R55 Syncope and collapse: Secondary | ICD-10-CM

## 2019-01-29 DIAGNOSIS — R002 Palpitations: Secondary | ICD-10-CM | POA: Diagnosis not present

## 2019-01-29 DIAGNOSIS — N189 Chronic kidney disease, unspecified: Secondary | ICD-10-CM
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
## 2019-02-14 ENCOUNTER — Encounter: Payer: Self-pay | Admitting: Physician Assistant

## 2019-02-14 ENCOUNTER — Ambulatory Visit (INDEPENDENT_AMBULATORY_CARE_PROVIDER_SITE_OTHER): Payer: Medicare Other | Admitting: Physician Assistant

## 2019-02-14 VITALS — BP 132/80 | HR 77 | Temp 97.1°F | Ht 76.0 in | Wt 190.0 lb

## 2019-02-14 DIAGNOSIS — K5900 Constipation, unspecified: Secondary | ICD-10-CM | POA: Diagnosis not present

## 2019-02-14 DIAGNOSIS — K219 Gastro-esophageal reflux disease without esophagitis: Secondary | ICD-10-CM | POA: Diagnosis not present

## 2019-02-14 MED ORDER — ESOMEPRAZOLE MAGNESIUM 40 MG PO CPDR: 40.0000 mg | DELAYED_RELEASE_CAPSULE | Freq: Every day | ORAL | 3 refills | Status: DC

## 2019-02-14 MED ORDER — FAMOTIDINE 20 MG PO TABS: 20.0000 mg | ORAL_TABLET | Freq: Every day | ORAL | 3 refills | Status: DC

## 2019-02-14 NOTE — Patient Instructions (Addendum)
If you are age 66 or older, your body mass index should be between 23-30. Your Body mass index is 23.13 kg/m. If this is out of the aforementioned range listed, please consider follow up with your Primary Care Provider.  If you are age 38 or younger, your body mass index should be between 19-25. Your Body mass index is 23.13 kg/m. If this is out of the aformentioned range listed, please consider follow up with your Primary Care Provider.   We have sent the following medications to your pharmacy for you to pick up at your convenience:  1. Nexium 40 mg daily.  2. Pepcid 20 mg daily.   Please purchase the following medications over the counter and take as directed:  1. Start Benefiber daily.   We have given you a high fiber diet handout. Please strive to have 25-30 grams of fiber daily.   High-Fiber Diet Fiber, also called dietary fiber, is a type of carbohydrate that is found in fruits, vegetables, whole grains, and beans. A high-fiber diet can have many health benefits. Your health care provider may recommend a high-fiber diet to help:  Prevent constipation. Fiber can make your bowel movements more regular.  Lower your cholesterol.  Relieve the following conditions: ? Swelling of veins in the anus (hemorrhoids). ? Swelling and irritation (inflammation) of specific areas of the digestive tract (uncomplicated diverticulosis). ? A problem of the large intestine (colon) that sometimes causes pain and diarrhea (irritable bowel syndrome, IBS).  Prevent overeating as part of a weight-loss plan.  Prevent heart disease, type 2 diabetes, and certain cancers. What is my plan? The recommended daily fiber intake in grams (g) includes:  38 g for men age 27 or younger.  30 g for men over age 27.  72 g for women age 33 or younger.  21 g for women over age 94. You can get the recommended daily intake of dietary fiber by:  Eating a variety of fruits, vegetables, grains, and beans.  Taking  a fiber supplement, if it is not possible to get enough fiber through your diet. What do I need to know about a high-fiber diet?  It is better to get fiber through food sources rather than from fiber supplements. There is not a lot of research about how effective supplements are.  Always check the fiber content on the nutrition facts label of any prepackaged food. Look for foods that contain 5 g of fiber or more per serving.  Talk with a diet and nutrition specialist (dietitian) if you have questions about specific foods that are recommended or not recommended for your medical condition, especially if those foods are not listed below.  Gradually increase how much fiber you consume. If you increase your intake of dietary fiber too quickly, you may have bloating, cramping, or gas.  Drink plenty of water. Water helps you to digest fiber. What are tips for following this plan?  Eat a wide variety of high-fiber foods.  Make sure that half of the grains that you eat each day are whole grains.  Eat breads and cereals that are made with whole-grain flour instead of refined flour or white flour.  Eat brown rice, bulgur wheat, or millet instead of white rice.  Start the day with a breakfast that is high in fiber, such as a cereal that contains 5 g of fiber or more per serving.  Use beans in place of meat in soups, salads, and pasta dishes.  Eat high-fiber snacks, such as  berries, raw vegetables, nuts, and popcorn.  Choose whole fruits and vegetables instead of processed forms like juice or sauce. What foods can I eat?  Fruits Berries. Pears. Apples. Oranges. Avocado. Prunes and raisins. Dried figs. Vegetables Sweet potatoes. Spinach. Kale. Artichokes. Cabbage. Broccoli. Cauliflower. Green peas. Carrots. Squash. Grains Whole-grain breads. Multigrain cereal. Oats and oatmeal. Brown rice. Barley. Bulgur wheat. [place]. Quinoa. Bran muffins. Popcorn. Rye wafer crackers. Meats and other  proteins [place], kidney, and pinto beans. Soybeans. Split peas. Lentils. Nuts and seeds. Dairy Fiber-fortified yogurt. Beverages Fiber-fortified soy milk. Fiber-fortified orange juice. Other foods Fiber bars. The items listed above may not be a complete list of recommended foods and beverages. Contact a dietitian for more options. What foods are not recommended? Fruits Fruit juice. Cooked, strained fruit. Vegetables Fried potatoes. Canned vegetables. Well-cooked vegetables. Grains White bread. Pasta made with refined flour. White rice. Meats and other proteins Fatty cuts of meat. Fried chicken or fried fish. Dairy Milk. Yogurt. Cream cheese. Sour cream. Fats and oils Butters. Beverages Soft drinks. Other foods Cakes and pastries. The items listed above may not be a complete list of foods and beverages to avoid. Contact a dietitian for more information. Summary  Fiber is a type of carbohydrate. It is found in fruits, vegetables, whole grains, and beans.  There are many health benefits of eating a high-fiber diet, such as preventing constipation, lowering blood cholesterol, helping with weight loss, and reducing your risk of heart disease, diabetes, and certain cancers.  Gradually increase your intake of fiber. Increasing too fast can result in cramping, bloating, and gas. Drink plenty of water while you increase your fiber.  The best sources of fiber include whole fruits and vegetables, whole grains, nuts, seeds, and beans. This information is not intended to replace advice given to you by your health care provider. Make sure you discuss any questions you have with your health care provider. Document Released: 03/14/2005 Document Revised: 01/16/2017 Document Reviewed: 01/16/2017 Elsevier Patient Education  2020 [employer].
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
## 2019-02-25 ENCOUNTER — Telehealth: Payer: Self-pay

## 2019-02-25 ENCOUNTER — Other Ambulatory Visit: Payer: Self-pay

## 2019-02-25 ENCOUNTER — Ambulatory Visit (INDEPENDENT_AMBULATORY_CARE_PROVIDER_SITE_OTHER): Payer: Medicare Other

## 2019-02-25 DIAGNOSIS — E538 Deficiency of other specified B group vitamins: Secondary | ICD-10-CM | POA: Diagnosis not present

## 2019-02-25 MED ORDER — CYANOCOBALAMIN 1000 MCG/ML IJ SOLN
1000.0000 ug | INTRAMUSCULAR | Status: AC
  Administered 2019-02-25 – 2020-02-06 (×12): 1000 ug via INTRAMUSCULAR

## 2019-02-25 NOTE — Progress Notes (Signed)
Reviewed and agree with documentation and assessment and plan. K. Veena Nandigam , MD   

## 2019-02-25 NOTE — Telephone Encounter (Signed)
He has active standing orders ready for him to come every 3-4 months

## 2019-02-25 NOTE — Progress Notes (Signed)
Cyanocobalamin injection given to right deltoid.  Patient tolerated well. 

## 2019-02-25 NOTE — Telephone Encounter (Signed)
Patient has his regular follow up appointment with you in March.  He would like for you to order his blood work so he can come in 2-3 days before his appointment for lab work.

## 2019-03-05 ENCOUNTER — Encounter: Payer: Self-pay | Admitting: Cardiology
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
## 2019-03-06 ENCOUNTER — Other Ambulatory Visit: Payer: Self-pay

## 2019-03-06 ENCOUNTER — Encounter: Payer: Self-pay | Admitting: Cardiology

## 2019-03-06 ENCOUNTER — Ambulatory Visit (INDEPENDENT_AMBULATORY_CARE_PROVIDER_SITE_OTHER): Payer: Medicare Other | Admitting: Cardiology

## 2019-03-06 VITALS — BP 150/76 | HR 82 | Ht 76.0 in | Wt 186.0 lb

## 2019-03-06 DIAGNOSIS — R55 Syncope and collapse: Secondary | ICD-10-CM | POA: Diagnosis not present

## 2019-03-06 DIAGNOSIS — R002 Palpitations: Secondary | ICD-10-CM

## 2019-03-06 DIAGNOSIS — Z7189 Other specified counseling: Secondary | ICD-10-CM | POA: Diagnosis not present

## 2019-03-06 NOTE — Patient Instructions (Signed)
Medication Instructions:  The current medical regimen is effective;  continue present plan and medications.  If you need a refill on your cardiac medications before your next appointment, please call your pharmacy.   Follow-Up: Follow up as needed with Dr Hochrein.  Thank you for choosing [facility]!!      

## 2019-03-12 ENCOUNTER — Encounter: Payer: Self-pay | Admitting: Psychiatry

## 2019-03-12 ENCOUNTER — Ambulatory Visit (INDEPENDENT_AMBULATORY_CARE_PROVIDER_SITE_OTHER): Payer: Medicare Other | Admitting: Psychiatry

## 2019-03-12 DIAGNOSIS — F411 Generalized anxiety disorder: Secondary | ICD-10-CM | POA: Diagnosis not present

## 2019-03-12 DIAGNOSIS — Z87820 Personal history of traumatic brain injury: Secondary | ICD-10-CM

## 2019-03-12 DIAGNOSIS — F259 Schizoaffective disorder, unspecified: Secondary | ICD-10-CM | POA: Diagnosis not present

## 2019-03-12 DIAGNOSIS — G3184 Mild cognitive impairment, so stated: Secondary | ICD-10-CM

## 2019-03-12 DIAGNOSIS — F4001 Agoraphobia with panic disorder: Secondary | ICD-10-CM
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
## 2019-03-26 ENCOUNTER — Other Ambulatory Visit: Payer: Self-pay | Admitting: Psychiatry

## 2019-03-26 ENCOUNTER — Other Ambulatory Visit: Payer: Self-pay

## 2019-03-26 NOTE — Telephone Encounter (Signed)
Due back 07/09/2019

## 2019-03-27 ENCOUNTER — Ambulatory Visit (INDEPENDENT_AMBULATORY_CARE_PROVIDER_SITE_OTHER): Payer: Medicare Other | Admitting: *Deleted

## 2019-03-27 DIAGNOSIS — E538 Deficiency of other specified B group vitamins: Secondary | ICD-10-CM | POA: Diagnosis not present

## 2019-04-01 ENCOUNTER — Ambulatory Visit (INDEPENDENT_AMBULATORY_CARE_PROVIDER_SITE_OTHER): Payer: Medicare Other

## 2019-04-01 ENCOUNTER — Other Ambulatory Visit: Payer: Self-pay

## 2019-04-01 ENCOUNTER — Encounter: Payer: Self-pay | Admitting: Family Medicine

## 2019-04-01 ENCOUNTER — Ambulatory Visit (INDEPENDENT_AMBULATORY_CARE_PROVIDER_SITE_OTHER): Payer: Medicare Other | Admitting: Family Medicine

## 2019-04-01 VITALS — BP 127/64 | HR 67 | Temp 98.4°F | Resp 20 | Ht 76.0 in | Wt 189.0 lb

## 2019-04-01 DIAGNOSIS — M25561 Pain in right knee: Secondary | ICD-10-CM | POA: Diagnosis not present

## 2019-04-01 DIAGNOSIS — W19XXXA Unspecified fall, initial encounter: Secondary | ICD-10-CM | POA: Diagnosis not present

## 2019-04-01 DIAGNOSIS — R0781 Pleurodynia: Secondary | ICD-10-CM

## 2019-04-01 NOTE — Patient Instructions (Addendum)
Chest Wall Pain Chest wall pain is pain in or around the bones and muscles of your chest. Chest wall pain may be caused by:  An injury.  Coughing a lot.  Using your chest and arm muscles too much. Sometimes, the cause may not be known. This pain may take a few weeks or longer to get better. Follow these instructions at home: Managing pain, stiffness, and swelling If told, put ice on the painful area:  Put ice in a plastic bag.  Place a towel between your skin and the bag.  Leave the ice on for 20 minutes, 2-3 times a day.  Activity  Rest as told by your doctor.  Avoid doing things that cause pain. This includes lifting heavy items.  Ask your doctor what activities are safe for you. General instructions   Take over-the-counter and prescription medicines only as told by your doctor.  Do not use any products that contain nicotine or tobacco, such as cigarettes, e-cigarettes, and chewing tobacco. If you need help quitting, ask your doctor.  Keep all follow-up visits as told by your doctor. This is important. Contact a doctor if:  You have a fever.  Your chest pain gets worse.  You have new symptoms. Get help right away if:  You feel sick to your stomach (nauseous) or you throw up (vomit).  You feel sweaty or light-headed.  You have a cough with mucus from your lungs (sputum) or you cough up blood.  You are short of breath. These symptoms may be an emergency. Do not wait to see if the symptoms will go away. Get medical help right away. Call your local emergency services (911 in the [place] Do not drive yourself to the hospital. Summary  Chest wall pain is pain in or around the bones and muscles of your chest.  It may be treated with ice, rest, and medicines. Your condition may also get better if you avoid doing things that cause pain.  Contact a doctor if you have a fever, chest pain that gets worse, or new symptoms.  Get help right away if you feel light-headed  or you get short of breath. These symptoms may be an emergency. This information is not intended to replace advice given to you by your health care provider. Make sure you discuss any questions you have with your health care provider. Document Revised: 09/14/2017 Document Reviewed: 09/14/2017 Elsevier Patient Education  [employer].  Rib Fracture  A rib fracture is a break or crack in one of the bones of the ribs. The ribs are like a cage that goes around your upper chest. A broken or cracked rib is often painful, but most do not cause other problems. Most rib fractures usually heal on their own in 1-3 months. Follow these instructions at home: Managing pain, stiffness, and swelling  If directed, apply ice to the injured area. ? Put ice in a plastic bag. ? Place a towel between your skin and the bag. ? Leave the ice on for 20 minutes, 2-3 times a day.  Take over-the-counter and prescription medicines only as told by your doctor. Activity  Avoid activities that cause pain to the injured area. Protect your injured area.  Slowly increase activity as told by your doctor. General instructions  Do deep breathing as told by your doctor. You may be told to: ? Take deep breaths many times a day. ? Cough many times a day while hugging a pillow. ? Use a device (incentive spirometer)  to do deep breathing many times a day.  Drink enough fluid to keep your pee (urine) clear or pale yellow.  Do not wear a rib belt or binder. These do not allow you to breathe deeply.  Keep all follow-up visits as told by your doctor. This is important. Contact a doctor if:  You have a fever. Get help right away if:  You have trouble breathing.  You are short of breath.  You cannot stop coughing.  You cough up thick or bloody spit (sputum).  You feel sick to your stomach (nauseous), throw up (vomit), or have belly (abdominal) pain.  Your pain gets worse and medicine does not help. Summary  A  rib fracture is a break or crack in one of the bones of the ribs.  Apply ice to the injured area and take medicines for pain as told by your doctor.  Take deep breaths and cough many times a day. Hug a pillow every time you cough. This information is not intended to replace advice given to you by your health care provider. Make sure you discuss any questions you have with your health care provider. Document Revised: 02/24/2017 Document Reviewed: 06/14/2016 Elsevier Patient Education  2020 [employer].
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
## 2019-04-22 ENCOUNTER — Other Ambulatory Visit: Payer: Self-pay | Admitting: *Deleted

## 2019-04-22 DIAGNOSIS — I10 Essential (primary) hypertension: Secondary | ICD-10-CM

## 2019-04-22 MED ORDER — LOSARTAN POTASSIUM 100 MG PO TABS: 100.0000 mg | ORAL_TABLET | Freq: Every morning | ORAL | 1 refills | Status: DC

## 2019-04-29 ENCOUNTER — Ambulatory Visit (INDEPENDENT_AMBULATORY_CARE_PROVIDER_SITE_OTHER): Payer: Medicare Other | Admitting: *Deleted

## 2019-04-29 ENCOUNTER — Other Ambulatory Visit: Payer: Self-pay

## 2019-04-29 DIAGNOSIS — E538 Deficiency of other specified B group vitamins: Secondary | ICD-10-CM

## 2019-04-29 NOTE — Progress Notes (Signed)
B12 injection given and tolerated well.  

## 2019-05-03 ENCOUNTER — Ambulatory Visit (INDEPENDENT_AMBULATORY_CARE_PROVIDER_SITE_OTHER): Payer: Medicare Other | Admitting: Family

## 2019-05-03 ENCOUNTER — Other Ambulatory Visit: Payer: Self-pay

## 2019-05-03 ENCOUNTER — Ambulatory Visit (INDEPENDENT_AMBULATORY_CARE_PROVIDER_SITE_OTHER): Payer: Medicare Other

## 2019-05-03 ENCOUNTER — Other Ambulatory Visit: Payer: Self-pay | Admitting: Family

## 2019-05-03 ENCOUNTER — Encounter: Payer: Self-pay | Admitting: Family

## 2019-05-03 VITALS — BP 145/75 | HR 83 | Temp 96.6°F | Ht 76.0 in | Wt 187.6 lb

## 2019-05-03 DIAGNOSIS — F259 Schizoaffective disorder, unspecified: Secondary | ICD-10-CM

## 2019-05-03 DIAGNOSIS — F1320 Sedative, hypnotic or anxiolytic dependence, uncomplicated: Secondary | ICD-10-CM | POA: Insufficient documentation

## 2019-05-03 DIAGNOSIS — W19XXXA Unspecified fall, initial encounter: Secondary | ICD-10-CM

## 2019-05-03 DIAGNOSIS — R0781 Pleurodynia: Secondary | ICD-10-CM | POA: Diagnosis not present

## 2019-05-03 DIAGNOSIS — S20212A Contusion of left front wall of thorax, initial encounter: Secondary | ICD-10-CM

## 2019-05-03 NOTE — Patient Instructions (Signed)
Rib Contusion A rib contusion is a deep bruise on your rib area. Contusions are the result of a blunt trauma that causes bleeding and injury to the tissues under the skin. A rib contusion may involve bruising of the ribs and of the skin and muscles in the area. The skin over the contusion may turn blue, purple, or yellow. Minor injuries will give you a painless contusion. More severe contusions may stay painful and swollen for a few weeks. What are the causes? This condition is usually caused by a blow, trauma, or direct force to an area of the body. This often occurs while playing contact sports. What are the signs or symptoms? Symptoms of this condition include:  Swelling and redness of the injured area.  Discoloration of the injured area.  Tenderness and soreness of the injured area.  Pain with or without movement. How is this diagnosed? This condition may be diagnosed based on:  Your symptoms and medical history.  A physical exam.  Imaging tests--such as an X-ray, CT scan, or MRI--to determine if there were internal injuries or broken bones (fractures). How is this treated? This condition may be treated with:  Rest. This is often the best treatment for a rib contusion.  Icing. This reduces swelling and inflammation.  Deep-breathing exercises. These may be recommended to reduce the risk for lung collapse and pneumonia.  Medicines. Over-the-counter or prescription medicines may be given to control pain.  Injection of a numbing medicine around the nerve near your injury (nerve block). Follow these instructions at home:     Medicines  Take over-the-counter and prescription medicines only as told by your health care provider.  Do not drive or use heavy machinery while taking prescription pain medicine.  If you are taking prescription pain medicine, take actions to prevent or treat constipation. Your health care provider may recommend that you: ? Drink enough fluid to keep  your urine pale yellow. ? Eat foods that are high in fiber, such as fresh fruits and vegetables, whole grains, and beans. ? Limit foods that are high in fat and processed sugars, such as fried or sweet foods. ? Take an over-the-counter or prescription medicine for constipation. Managing pain, stiffness, and swelling  If directed, put ice on the injured area: ? Put ice in a plastic bag. ? Place a towel between your skin and the bag. ? Leave the ice on for 20 minutes, 2-3 times a day.  Rest the injured area. Avoid strenuous activity and any activities or movements that cause pain. Be careful during activities and avoid bumping the injured area.  Do not lift anything that is heavier than 5 lb (2.3 kg), or the limit that you are told, until your health care provider says that it is safe. General instructions  Do not use any products that contain nicotine or tobacco, such as cigarettes and e-cigarettes. These can delay healing. If you need help quitting, ask your health care provider.  Do deep-breathing exercises as told by your health care provider.  If you were given an incentive spirometer, use it every 1-2 hours while you are awake, or as recommended by your health care provider. This device measures how well you are filling your lungs with each breath.  Keep all follow-up visits as told by your health care provider. This is important. Contact a health care provider if you have:  Increased bruising or swelling.  Pain that is not controlled with treatment.  A fever. Get help right away if   you:  Have difficulty breathing or shortness of breath.  Develop a continual cough or you cough up thick or bloody sputum.  Feel nauseous or you vomit.  Have pain in your abdomen. Summary  A rib contusion is a deep bruise on your rib area. Contusions are the result of a blunt trauma that causes bleeding and injury to the tissues under the skin.  The skin overlying the contusion may turn  blue, purple, or yellow. Minor injuries may give you a painless contusion. More severe contusions may stay painful and swollen for a few weeks.  Rest the injured area. Avoid strenuous activity and any activities or movements that cause pain. This information is not intended to replace advice given to you by your health care provider. Make sure you discuss any questions you have with your health care provider. Document Revised: 04/12/2017 Document Reviewed: 04/12/2017 Elsevier Patient Education  2020 [employer]  

## 2019-05-03 NOTE — Progress Notes (Signed)
Subjective:    Patient ID: Matthew [name], male    DOB: 1950-08-24, 69 y.o.   MRN: [id number]  Chief Complaint  Patient presents with  . Rib Injury    left side     Chest Pain  This is a new problem.   PT presents to the office today with left rib pain. He reports he was trying to answer his phone and lost his balance and fell. He hit table on his left side. He reports an intermittent  pain of 7 out 10 that is worse when he lays down, coughs, or takes a deep breath.   He has taken tylenol with mild relief.   Pt does take Ativan 1 mg QID. He is followed by [facility]. He has hx schizoaffective disorder that is controlled at this time.    Review of Systems  Cardiovascular: Positive for chest pain.  All other systems reviewed and are negative.      Objective:   Physical Exam Vitals reviewed.  Constitutional:      General: He is not in acute distress.    Appearance: He is well-developed.  HENT:     Head: Normocephalic.     Right Ear: Tympanic membrane normal.     Left Ear: Tympanic membrane normal.  Eyes:     General:        Right eye: No discharge.        Left eye: No discharge.     Pupils: Pupils are equal, round, and reactive to light.  Neck:     Thyroid: No thyromegaly.  Cardiovascular:     Rate and Rhythm: Normal rate and regular rhythm.     Heart sounds: Normal heart sounds. No murmur.  Pulmonary:     Effort: Pulmonary effort is normal. No respiratory distress.     Breath sounds: Normal breath sounds. No wheezing.  Abdominal:     General: Bowel sounds are normal. There is no distension.     Palpations: Abdomen is soft.     Tenderness: There is no abdominal tenderness.  Musculoskeletal:        General: No tenderness. Normal range of motion.       Arms:     Cervical back: Normal range of motion and neck supple.     Comments: Pain with palpation   Skin:    General: Skin is warm and dry.     Findings: No erythema or rash.  Neurological:     Mental  Status: He is alert and oriented to person, place, and time.     Cranial Nerves: No cranial nerve deficit.     Deep Tendon Reflexes: Reflexes are normal and symmetric.  Psychiatric:        Behavior: Behavior normal.        Thought Content: Thought content normal.        Judgment: Judgment normal.       BP (!) 145/75   Pulse 83   Temp (!) 96.6 F (35.9 C) (Temporal)   Ht 6\' 4"  (1.93 m)   Wt 187 lb 9.6 oz (85.1 kg)   SpO2 100%   BMI 22.84 kg/m      Assessment & Plan:  Matthew [name] comes in today with chief complaint of Rib Injury (left side )   Diagnosis and orders addressed:  1. Fall, initial encounter  2. Rib pain  3. Contusion of rib on left side, initial encounter  4. Schizoaffective disorder, unspecified type ([place])  5.  Benzodiazepine dependence ([place])  Fall preventions discussed Rest Tylenol as needed  Deep breathing and coughing encouraged with support Discussed talking to [facility] health about decreasing Ativan from QID. Pt states he does like to take medications, but is nervous about decreasing. Discussed this medication increases chances of falling.    Evelina Dun, FNP    Evelina Dun, [place]

## 2019-05-13 ENCOUNTER — Encounter: Payer: Self-pay | Admitting: *Deleted

## 2019-05-21 ENCOUNTER — Telehealth: Payer: Self-pay | Admitting: Physician Assistant

## 2019-05-22 ENCOUNTER — Other Ambulatory Visit: Payer: Self-pay | Admitting: Physician Assistant

## 2019-05-22 DIAGNOSIS — E538 Deficiency of other specified B group vitamins: Secondary | ICD-10-CM

## 2019-05-22 NOTE — Telephone Encounter (Signed)
Patient aware.

## 2019-05-22 NOTE — Telephone Encounter (Signed)
Will do!

## 2019-05-24 ENCOUNTER — Other Ambulatory Visit: Payer: Medicare Other

## 2019-05-24 ENCOUNTER — Other Ambulatory Visit: Payer: Self-pay

## 2019-05-27 ENCOUNTER — Ambulatory Visit (INDEPENDENT_AMBULATORY_CARE_PROVIDER_SITE_OTHER): Payer: Medicare Other

## 2019-05-27 ENCOUNTER — Other Ambulatory Visit: Payer: Self-pay

## 2019-05-27 DIAGNOSIS — E538 Deficiency of other specified B group vitamins: Secondary | ICD-10-CM

## 2019-05-27 DIAGNOSIS — F259 Schizoaffective disorder, unspecified: Secondary | ICD-10-CM

## 2019-05-27 DIAGNOSIS — N189 Chronic kidney disease, unspecified: Secondary | ICD-10-CM

## 2019-05-27 DIAGNOSIS — I10 Essential (primary) hypertension: Secondary | ICD-10-CM

## 2019-05-27 NOTE — Progress Notes (Signed)
Pt tolerated B-12 injection well

## 2019-05-28 LAB — LIPID PANEL
Chol/HDL Ratio: 2.9 ratio (ref 0.0–5.0)
Cholesterol, Total: 162 mg/dL (ref 100–199)
HDL: 55 mg/dL (ref >39)
LDL Chol Calc (NIH): 88 mg/dL (ref 0–99)
Triglycerides: 108 mg/dL (ref 0–149)
VLDL Cholesterol Cal: 19 mg/dL (ref 5–40)

## 2019-05-28 LAB — CMP14+EGFR
ALT: 20 IU/L (ref 0–44)
AST: 23 IU/L (ref 0–40)
Albumin/Globulin Ratio: 1.6 (ref 1.2–2.2)
Albumin: 4.2 g/dL (ref 3.8–4.8)
Alkaline Phosphatase: 132 IU/L — ABNORMAL HIGH (ref 39–117)
BUN/Creatinine Ratio: 11 (ref 10–24)
BUN: 21 mg/dL (ref 8–27)
Bilirubin Total: 0.3 mg/dL (ref 0.0–1.2)
CO2: 20 mmol/L (ref 20–29)
Calcium: 9.2 mg/dL (ref 8.6–10.2)
Chloride: 103 mmol/L (ref 96–106)
Creatinine, Ser: 1.92 mg/dL — ABNORMAL HIGH (ref 0.76–1.27)
GFR calc Af Amer: 41 mL/min/{1.73_m2} — ABNORMAL LOW (ref >59)
GFR calc non Af Amer: 35 mL/min/{1.73_m2} — ABNORMAL LOW (ref >59)
Globulin, Total: 2.7 g/dL (ref 1.5–4.5)
Glucose: 98 mg/dL (ref 65–99)
Potassium: 4.1 mmol/L (ref 3.5–5.2)
Sodium: 139 mmol/L (ref 134–144)
Total Protein: 6.9 g/dL (ref 6.0–8.5)

## 2019-05-28 LAB — CBC WITH DIFFERENTIAL/PLATELET
Basophils Absolute: 0.1 10*3/uL (ref 0.0–0.2)
Basos: 1 %
EOS (ABSOLUTE): 0.2 10*3/uL (ref 0.0–0.4)
Eos: 3 %
Hematocrit: 35.6 % — ABNORMAL LOW (ref 37.5–51.0)
Hemoglobin: 11.9 g/dL — ABNORMAL LOW (ref 13.0–17.7)
Immature Grans (Abs): 0 10*3/uL (ref 0.0–0.1)
Immature Granulocytes: 0 %
Lymphocytes Absolute: 1.4 10*3/uL (ref 0.7–3.1)
Lymphs: 22 %
MCH: 29.6 pg (ref 26.6–33.0)
MCHC: 33.4 g/dL (ref 31.5–35.7)
MCV: 89 fL (ref 79–97)
Monocytes Absolute: 0.6 10*3/uL (ref 0.1–0.9)
Monocytes: 10 %
Neutrophils Absolute: 3.8 10*3/uL (ref 1.4–7.0)
Neutrophils: 64 %
Platelets: 197 10*3/uL (ref 150–450)
RBC: 4.02 x10E6/uL — ABNORMAL LOW (ref 4.14–5.80)
RDW: 12.3 % (ref 11.6–15.4)
WBC: 6 10*3/uL (ref 3.4–10.8)

## 2019-05-28 LAB — CARBAMAZEPINE LEVEL, TOTAL: Carbamazepine (Tegretol), S: 6.9 ug/mL (ref 4.0–12.0)

## 2019-05-28 LAB — VITAMIN B12: Vitamin B-12: 731 pg/mL (ref 232–1245)

## 2019-05-29 ENCOUNTER — Ambulatory Visit: Payer: Medicare Other | Admitting: Physician Assistant

## 2019-06-03 ENCOUNTER — Telehealth: Payer: Self-pay | Admitting: Physician Assistant

## 2019-06-03 NOTE — Telephone Encounter (Signed)
Pt was supposed to see Glenard Haring on 06/05/19 for 90mth ck but had to r/s due to [place] not being able to be in office. Pt is requesting to come in on 06/10/19 around 2PM if possible.

## 2019-06-03 NOTE — Telephone Encounter (Signed)
Appt scheduled

## 2019-06-05 ENCOUNTER — Ambulatory Visit: Payer: Medicare Other | Admitting: Physician Assistant

## 2019-06-10 ENCOUNTER — Other Ambulatory Visit: Payer: Self-pay

## 2019-06-10 ENCOUNTER — Ambulatory Visit (INDEPENDENT_AMBULATORY_CARE_PROVIDER_SITE_OTHER): Payer: Medicare Other | Admitting: Physician Assistant

## 2019-06-10 ENCOUNTER — Encounter: Payer: Self-pay | Admitting: Physician Assistant

## 2019-06-10 VITALS — BP 134/66 | HR 77 | Temp 97.3°F | Ht 76.0 in | Wt 183.8 lb

## 2019-06-10 DIAGNOSIS — N1831 Chronic kidney disease, stage 3a: Secondary | ICD-10-CM | POA: Diagnosis not present

## 2019-06-10 DIAGNOSIS — L719 Rosacea, unspecified: Secondary | ICD-10-CM

## 2019-06-10 DIAGNOSIS — F411 Generalized anxiety disorder: Secondary | ICD-10-CM

## 2019-06-10 MED ORDER — PREDNISONE 10 MG (48) PO TBPK: ORAL_TABLET | ORAL | 0 refills | Status: DC

## 2019-06-10 MED ORDER — METRONIDAZOLE 0.75 % EX CREA: TOPICAL_CREAM | Freq: Two times a day (BID) | CUTANEOUS | 11 refills | Status: DC
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
## 2019-06-25 ENCOUNTER — Telehealth: Payer: Self-pay | Admitting: Psychiatry

## 2019-06-25 NOTE — Telephone Encounter (Signed)
Matthew [name] called today to report that his Mamma past away.  This is very hard for him.  He has telehealth appt 07/09/19, but do you have any guidance for him to help him get through this.

## 2019-06-26 ENCOUNTER — Other Ambulatory Visit: Payer: Self-pay | Admitting: Psychiatry

## 2019-07-02 ENCOUNTER — Ambulatory Visit (INDEPENDENT_AMBULATORY_CARE_PROVIDER_SITE_OTHER): Payer: Medicare Other | Admitting: *Deleted

## 2019-07-02 ENCOUNTER — Other Ambulatory Visit: Payer: Self-pay

## 2019-07-02 DIAGNOSIS — E538 Deficiency of other specified B group vitamins: Secondary | ICD-10-CM

## 2019-07-02 NOTE — Progress Notes (Signed)
B12 injection given IM left deltoid, patient tolerated well.

## 2019-07-09 ENCOUNTER — Ambulatory Visit (INDEPENDENT_AMBULATORY_CARE_PROVIDER_SITE_OTHER): Payer: Medicare Other | Admitting: Psychiatry

## 2019-07-09 ENCOUNTER — Encounter: Payer: Self-pay | Admitting: Psychiatry

## 2019-07-09 DIAGNOSIS — F411 Generalized anxiety disorder: Secondary | ICD-10-CM

## 2019-07-09 DIAGNOSIS — Z87820 Personal history of traumatic brain injury: Secondary | ICD-10-CM

## 2019-07-09 DIAGNOSIS — F4001 Agoraphobia with panic disorder: Secondary | ICD-10-CM

## 2019-07-09 DIAGNOSIS — G3184 Mild cognitive impairment, so stated: Secondary | ICD-10-CM

## 2019-07-09 DIAGNOSIS — F259 Schizoaffective disorder, unspecified: Secondary | ICD-10-CM | POA: Diagnosis not present
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
## 2019-07-16 ENCOUNTER — Telehealth: Payer: Self-pay | Admitting: Psychiatry

## 2019-07-16 ENCOUNTER — Ambulatory Visit: Payer: Medicare Other | Admitting: Family Medicine

## 2019-07-16 NOTE — Telephone Encounter (Signed)
Noted. Agree with recommendations.  

## 2019-07-16 NOTE — Telephone Encounter (Signed)
Matthew [name] just lost his mother two or three  weeks ago. His brother went to the hospital this week for a heart murmur, and he passed away last night. He is pretty distraught. I encouraged Matthew [name] to talk to his pastor and hospice and let us know if he starts feeling more down.

## 2019-07-24 ENCOUNTER — Other Ambulatory Visit: Payer: Self-pay | Admitting: Psychiatry

## 2019-07-26 ENCOUNTER — Telehealth: Payer: Self-pay | Admitting: Psychiatry

## 2019-07-26 NOTE — Telephone Encounter (Signed)
Matthew [name] called back to say Thank You for the sympathy cards he received. He is very grateful and thankful for our care.

## 2019-08-01 ENCOUNTER — Other Ambulatory Visit: Payer: Self-pay

## 2019-08-01 ENCOUNTER — Ambulatory Visit (INDEPENDENT_AMBULATORY_CARE_PROVIDER_SITE_OTHER): Payer: Medicare Other | Admitting: *Deleted

## 2019-08-01 DIAGNOSIS — E538 Deficiency of other specified B group vitamins: Secondary | ICD-10-CM

## 2019-08-12 ENCOUNTER — Telehealth: Payer: Self-pay | Admitting: Adult Health

## 2019-08-12 NOTE — Telephone Encounter (Signed)
Thank you for handling the call from this patient.  He is an extremely anxious patient who has been on Thorazine and carbamazepine for decades since his last hospitalization.  He was heavily dependent on his mother who has recently passed away.  He is chronically known to be reassurance seeking.  No further med change indicated.

## 2019-08-12 NOTE — Telephone Encounter (Signed)
Received call from after hours answering service on 08/10/2019 at 11:00 pm in regards to [facility] [birth year]. Patient concerned he had missed 2 to 3 nights without taking his 4th Tegretol 100mg  tablet. Concerned about having a seizure. Denied seizure history. Also noting both mother and brother recently passed away and was unable to keep up with things at times. Discussed plan for patient to remember medication and patient agreed.  Patient called a 2nd time for re-assuarance and discussion about taking medications.

## 2019-09-02 ENCOUNTER — Other Ambulatory Visit: Payer: Self-pay

## 2019-09-02 ENCOUNTER — Ambulatory Visit (INDEPENDENT_AMBULATORY_CARE_PROVIDER_SITE_OTHER): Payer: Medicare Other

## 2019-09-02 DIAGNOSIS — E538 Deficiency of other specified B group vitamins: Secondary | ICD-10-CM | POA: Diagnosis not present

## 2019-09-02 NOTE — Progress Notes (Signed)
B12 injection given. Pt tolerated well. No concerns.

## 2019-09-23 ENCOUNTER — Other Ambulatory Visit: Payer: Self-pay | Admitting: Psychiatry

## 2019-09-23 NOTE — Telephone Encounter (Signed)
Next apt 11/07/19

## 2019-10-02 ENCOUNTER — Other Ambulatory Visit: Payer: Self-pay

## 2019-10-02 ENCOUNTER — Ambulatory Visit (INDEPENDENT_AMBULATORY_CARE_PROVIDER_SITE_OTHER): Payer: Medicare Other | Admitting: Family Medicine

## 2019-10-02 DIAGNOSIS — E538 Deficiency of other specified B group vitamins: Secondary | ICD-10-CM | POA: Diagnosis not present

## 2019-10-02 NOTE — Progress Notes (Signed)
Patient given b12 in right deltoid tolerated well.

## 2019-10-08 ENCOUNTER — Other Ambulatory Visit: Payer: Medicare Other

## 2019-10-08 ENCOUNTER — Other Ambulatory Visit: Payer: Self-pay

## 2019-10-08 DIAGNOSIS — I10 Essential (primary) hypertension: Secondary | ICD-10-CM

## 2019-10-08 DIAGNOSIS — Z79899 Other long term (current) drug therapy: Secondary | ICD-10-CM

## 2019-10-09 LAB — LIPID PANEL
Chol/HDL Ratio: 2.8 ratio (ref 0.0–5.0)
Cholesterol, Total: 168 mg/dL (ref 100–199)
HDL: 60 mg/dL (ref >39)
LDL Chol Calc (NIH): 92 mg/dL (ref 0–99)
Triglycerides: 87 mg/dL (ref 0–149)
VLDL Cholesterol Cal: 16 mg/dL (ref 5–40)

## 2019-10-09 LAB — CBC WITH DIFFERENTIAL/PLATELET
Basophils Absolute: 0.1 10*3/uL (ref 0.0–0.2)
Basos: 1 %
EOS (ABSOLUTE): 0.2 10*3/uL (ref 0.0–0.4)
Eos: 3 %
Hematocrit: 36.3 % — ABNORMAL LOW (ref 37.5–51.0)
Hemoglobin: 12.3 g/dL — ABNORMAL LOW (ref 13.0–17.7)
Immature Grans (Abs): 0 10*3/uL (ref 0.0–0.1)
Immature Granulocytes: 0 %
Lymphocytes Absolute: 0.8 10*3/uL (ref 0.7–3.1)
Lymphs: 16 %
MCH: 29.6 pg (ref 26.6–33.0)
MCHC: 33.9 g/dL (ref 31.5–35.7)
MCV: 88 fL (ref 79–97)
Monocytes Absolute: 0.5 10*3/uL (ref 0.1–0.9)
Monocytes: 9 %
Neutrophils Absolute: 3.6 10*3/uL (ref 1.4–7.0)
Neutrophils: 71 %
Platelets: 204 10*3/uL (ref 150–450)
RBC: 4.15 x10E6/uL (ref 4.14–5.80)
RDW: 12.5 % (ref 11.6–15.4)
WBC: 5.2 10*3/uL (ref 3.4–10.8)

## 2019-10-09 LAB — CMP14+EGFR
ALT: 27 IU/L (ref 0–44)
AST: 25 IU/L (ref 0–40)
Albumin/Globulin Ratio: 1.4 (ref 1.2–2.2)
Albumin: 4.6 g/dL (ref 3.8–4.8)
Alkaline Phosphatase: 132 IU/L — ABNORMAL HIGH (ref 48–121)
BUN/Creatinine Ratio: 11 (ref 10–24)
BUN: 20 mg/dL (ref 8–27)
Bilirubin Total: 0.3 mg/dL (ref 0.0–1.2)
CO2: 23 mmol/L (ref 20–29)
Calcium: 9.3 mg/dL (ref 8.6–10.2)
Chloride: 104 mmol/L (ref 96–106)
Creatinine, Ser: 1.76 mg/dL — ABNORMAL HIGH (ref 0.76–1.27)
GFR calc Af Amer: 45 mL/min/{1.73_m2} — ABNORMAL LOW (ref >59)
GFR calc non Af Amer: 39 mL/min/{1.73_m2} — ABNORMAL LOW (ref >59)
Globulin, Total: 3.2 g/dL (ref 1.5–4.5)
Glucose: 103 mg/dL — ABNORMAL HIGH (ref 65–99)
Potassium: 4.6 mmol/L (ref 3.5–5.2)
Sodium: 140 mmol/L (ref 134–144)
Total Protein: 7.8 g/dL (ref 6.0–8.5)

## 2019-10-09 LAB — CARBAMAZEPINE LEVEL, TOTAL: Carbamazepine (Tegretol), S: 8.1 ug/mL (ref 4.0–12.0)

## 2019-10-09 NOTE — Progress Notes (Signed)
Hello Criss,  Your lab result is normal and/or stable.Some minor variations that are not significant are commonly marked abnormal, but do not represent any medical problem for you.  Best regards, Lashawnda Hancox, M.D.

## 2019-10-10 ENCOUNTER — Ambulatory Visit (INDEPENDENT_AMBULATORY_CARE_PROVIDER_SITE_OTHER): Payer: Medicare Other | Admitting: Family Medicine

## 2019-10-10 ENCOUNTER — Encounter: Payer: Self-pay | Admitting: Family Medicine

## 2019-10-10 ENCOUNTER — Other Ambulatory Visit: Payer: Self-pay

## 2019-10-10 ENCOUNTER — Ambulatory Visit: Payer: Medicare Other | Admitting: Physician Assistant

## 2019-10-10 VITALS — BP 134/64 | HR 79 | Temp 97.8°F | Resp 20 | Ht 76.0 in | Wt 186.1 lb

## 2019-10-10 DIAGNOSIS — K2100 Gastro-esophageal reflux disease with esophagitis, without bleeding: Secondary | ICD-10-CM | POA: Diagnosis not present

## 2019-10-10 DIAGNOSIS — I10 Essential (primary) hypertension: Secondary | ICD-10-CM

## 2019-10-10 DIAGNOSIS — F259 Schizoaffective disorder, unspecified: Secondary | ICD-10-CM | POA: Diagnosis not present

## 2019-10-10 DIAGNOSIS — F411 Generalized anxiety disorder: Secondary | ICD-10-CM

## 2019-10-14 ENCOUNTER — Encounter: Payer: Self-pay | Admitting: Family Medicine

## 2019-10-14 MED ORDER — AMLODIPINE BESYLATE 5 MG PO TABS: 5.0000 mg | ORAL_TABLET | Freq: Every day | ORAL | 1 refills | Status: DC

## 2019-10-14 MED ORDER — ESOMEPRAZOLE MAGNESIUM 40 MG PO CPDR: 40.0000 mg | DELAYED_RELEASE_CAPSULE | Freq: Every day | ORAL | 3 refills | Status: DC

## 2019-10-14 MED ORDER — LOSARTAN POTASSIUM 100 MG PO TABS: 100.0000 mg | ORAL_TABLET | Freq: Every morning | ORAL | 1 refills | Status: DC

## 2019-10-14 NOTE — Progress Notes (Signed)
Subjective:  Patient ID: Matthew [name], male    DOB: December 10, 1950  Age: 69 y.o. MRN: [id number]  CC: No chief complaint on file.   HPI [name] presents for a back nerve problem that is exacerbated currently by his mother and brother passing away this past spring.  His nerves have been bad since he was 69 years old.  He is followed by psychiatry, Dr. Marybelle Killings.  He has intermittent low back pain off and on.  He points to the area of the right sacroiliac joint.  Depression screen [facility] 2/9 10/10/2019 06/10/2019 05/03/2019  Decreased Interest 0 0 0  Down, Depressed, Hopeless 0 0 0  PHQ - 2 Score 0 0 0    History Kron has a past medical history of Agoraphobia, Anemia, Anxiety, Arthritis, Asthma, Barrett esophagus, Blood transfusion without reported diagnosis, Bronchitis, Chronic kidney disease, Complication of anesthesia, Elevated serum creatinine, Esophageal stricture, GERD (gastroesophageal reflux disease), Hypertension, Mood disorder ([place]), Neck pain, chronic, Neuromuscular disorder ([place]), Rosacea, and Vitamin B12 deficiency.   He has a past surgical history that includes Chest tube insertion; Fracture surgery; Wisdom tooth extraction; Lumbar laminectomy/decompression microdiscectomy (12/06/2011); Colonoscopy (08/20/2003); and Upper gastrointestinal endoscopy (03/15/2013).   His family history includes Diabetes in his mother; Heart disease in his mother; Stroke in his mother.He reports that he quit smoking about 34 years ago. His smoking use included cigarettes. He has a 0.90 pack-year smoking history. He has never used smokeless tobacco. He reports that he does not drink alcohol and does not use drugs.    ROS Review of Systems  Constitutional: Negative for fever.  Respiratory: Negative for shortness of breath.   Cardiovascular: Negative for chest pain.  Musculoskeletal: Negative for arthralgias.  Skin: Negative for rash.    Objective:  BP 134/64   Pulse 79   Temp 97.8  F (36.6 C) (Temporal)   Resp 20   Ht 6\' 4"  (1.93 m)   Wt 186 lb 2 oz (84.4 kg)   SpO2 100%   BMI 22.66 kg/m   BP Readings from Last 3 Encounters:  10/10/19 134/64  06/10/19 134/66  05/03/19 (!) 145/75    Wt Readings from Last 3 Encounters:  10/10/19 186 lb 2 oz (84.4 kg)  06/10/19 183 lb 12.8 oz (83.4 kg)  05/03/19 187 lb 9.6 oz (85.1 kg)     Physical Exam Constitutional:      General: He is not in acute distress.    Appearance: He is well-developed.  HENT:     Head: Normocephalic and atraumatic.     Right Ear: External ear normal.     Left Ear: External ear normal.     Nose: Nose normal.  Eyes:     Conjunctiva/sclera: Conjunctivae normal.     Pupils: Pupils are equal, round, and reactive to light.  Cardiovascular:     Rate and Rhythm: Normal rate and regular rhythm.     Heart sounds: Normal heart sounds. No murmur heard.   Pulmonary:     Effort: Pulmonary effort is normal. No respiratory distress.     Breath sounds: Normal breath sounds. No wheezing or rales.  Abdominal:     Palpations: Abdomen is soft.     Tenderness: There is no abdominal tenderness.  Musculoskeletal:        General: Normal range of motion.     Cervical back: Normal range of motion and neck supple.  Skin:    General: Skin is warm and dry.  Neurological:  Mental Status: He is alert and oriented to person, place, and time.     Deep Tendon Reflexes: Reflexes are normal and symmetric.  Psychiatric:        Behavior: Behavior normal.        Thought Content: Thought content normal.        Judgment: Judgment normal.       Assessment & Plan:   Diagnoses and all orders for this visit:  Essential hypertension -     amLODipine (NORVASC) 5 MG tablet; Take 1 tablet (5 mg total) by mouth daily. Take in the evening -     losartan (COZAAR) 100 MG tablet; Take 1 tablet (100 mg total) by mouth every morning.  GAD (generalized anxiety disorder)  Schizoaffective disorder, unspecified type  ([place])  Gastroesophageal reflux disease with esophagitis without hemorrhage -     esomeprazole (NEXIUM) 40 MG capsule; Take 1 capsule (40 mg total) by mouth daily.       I have discontinued Jeramy C. Donahey's predniSONE. I have also changed his amLODipine. Additionally, I am having him maintain his cyanocobalamin, diclofenac sodium, clobetasol, fluticasone, famotidine, metroNIDAZOLE, chlorproMAZINE, Ativan, TEGretol, trihexyphenidyl, esomeprazole, and losartan. We will continue to administer cyanocobalamin.  Allergies as of 10/10/2019      Reactions   Acetaminophen Nausea And Vomiting   Amoxicillin    REACTION: unspecified/unknown   Cephalexin Swelling   In hands   Erythromycin    REACTION:Increases tegretol level   Nsaids Other (See Comments)   REACTION: Cannot take due to kidney health   Penicillins    REACTION: Unknown      Medication List       Accurate as of October 10, 2019 11:59 PM. If you have any questions, ask your nurse or doctor.        STOP taking these medications   predniSONE 10 MG (48) Tbpk tablet Commonly known as: STERAPRED UNI-PAK 48 TAB Stopped by: Claretta Fraise, MD     TAKE these medications   amLODipine 5 MG tablet Commonly known as: NORVASC Take 1 tablet (5 mg total) by mouth daily. Take in the evening   Ativan 1 MG tablet Generic drug: LORazepam TAKE ONE TABLET FOUR TIMES DAILY.   chlorproMAZINE 100 MG tablet Commonly known as: THORAZINE TAKE 1 TABLET 4 TIMES A DAY   clobetasol 0.05 % external solution Commonly known as: TEMOVATE   cyanocobalamin 1000 MCG/ML injection Commonly known as: (VITAMIN B-12) Inject 1,000 mcg into the muscle every 30 (thirty) days.   esomeprazole 40 MG capsule Commonly known as: NEXIUM Take 1 capsule (40 mg total) by mouth daily.   famotidine 20 MG tablet Commonly known as: PEPCID Take 1 tablet (20 mg total) by mouth at bedtime.   fluticasone 50 MCG/ACT nasal spray Commonly known as: FLONASE 2 SPRAYS IN  EACH NOSTRIL ONCE A DAY AS NEEDED   losartan 100 MG tablet Commonly known as: COZAAR Take 1 tablet (100 mg total) by mouth every morning.   metroNIDAZOLE 0.75 % cream Commonly known as: METROCREAM Apply topically 2 (two) times daily.   TEGretol 200 MG tablet Generic drug: carbamazepine TAKE (1/2) TABLET FOUR TIMES DAILY.   trihexyphenidyl 2 MG tablet Commonly known as: ARTANE TAKE  (1)  TABLET TWICE A DAY WITH MEALS (BREAKFAST AND SUPPER)   Voltaren 1 % Gel Generic drug: diclofenac sodium APPLY TO AFFECTED AREA EVERY 6 TO 8 HOURS AS DIRECTED        Follow-up: No follow-ups on file.  Claretta Fraise, M.D.

## 2019-11-04 ENCOUNTER — Ambulatory Visit (INDEPENDENT_AMBULATORY_CARE_PROVIDER_SITE_OTHER): Payer: Medicare Other | Admitting: Family Medicine

## 2019-11-04 ENCOUNTER — Other Ambulatory Visit: Payer: Self-pay

## 2019-11-04 DIAGNOSIS — E538 Deficiency of other specified B group vitamins: Secondary | ICD-10-CM | POA: Diagnosis not present

## 2019-11-07 ENCOUNTER — Telehealth (INDEPENDENT_AMBULATORY_CARE_PROVIDER_SITE_OTHER): Payer: Medicare Other | Admitting: Psychiatry

## 2019-11-07 DIAGNOSIS — F411 Generalized anxiety disorder: Secondary | ICD-10-CM

## 2019-11-07 DIAGNOSIS — F4001 Agoraphobia with panic disorder: Secondary | ICD-10-CM | POA: Diagnosis not present

## 2019-11-07 DIAGNOSIS — G3184 Mild cognitive impairment, so stated: Secondary | ICD-10-CM | POA: Diagnosis not present

## 2019-11-07 DIAGNOSIS — Z87820 Personal history of traumatic brain injury: Secondary | ICD-10-CM

## 2019-11-07 DIAGNOSIS — F259 Schizoaffective disorder, unspecified: Secondary | ICD-10-CM | POA: Diagnosis not present
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
## 2019-11-18 ENCOUNTER — Telehealth: Payer: Self-pay | Admitting: Cardiology

## 2019-11-18 NOTE — Telephone Encounter (Signed)
Patient c/o Palpitations:  High priority if patient c/o lightheadedness, shortness of breath, or chest pain  1) How long have you had palpitations/irregular HR/ Afib? Are you having the symptoms now? Of and on  2) Are you currently experiencing lightheadedness, SOB or CP? no  3) Do you have a history of afib (atrial fibrillation) or irregular heart rhythm? No   4) Have you checked your BP or HR? (document readings if available): no  5) Are you experiencing any other symptoms? No patient would like to see the doctor sooner that 12/26/19

## 2019-11-18 NOTE — Telephone Encounter (Signed)
Returned the call to the patient. The patient stated that he has been having palpitations off and on. He stated that was told to call back if he started having palpitations again.   He has been advised to limit his caffeine. He is unable to check his heart rate at home. He denies any symptoms. An appointment has been made with Dr. Percival Spanish on 12/03/19. He has been advised to call back if he does become symptomatic or the palpitations increase in number.

## 2019-11-26 ENCOUNTER — Encounter: Payer: Self-pay | Admitting: Psychiatry

## 2019-12-02 DIAGNOSIS — Z7189 Other specified counseling: Secondary | ICD-10-CM | POA: Insufficient documentation

## 2019-12-02 DIAGNOSIS — R002 Palpitations: Secondary | ICD-10-CM | POA: Insufficient documentation

## 2019-12-02 DIAGNOSIS — R55 Syncope and collapse: Secondary | ICD-10-CM | POA: Insufficient documentation

## 2019-12-02 HISTORY — DX: Palpitations: R00.2

## 2019-12-02 HISTORY — DX: Other specified counseling: Z71.89
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
## 2019-12-03 ENCOUNTER — Other Ambulatory Visit: Payer: Self-pay

## 2019-12-03 ENCOUNTER — Encounter: Payer: Self-pay | Admitting: Cardiology

## 2019-12-03 ENCOUNTER — Ambulatory Visit (INDEPENDENT_AMBULATORY_CARE_PROVIDER_SITE_OTHER): Payer: Medicare Other | Admitting: Cardiology

## 2019-12-03 VITALS — BP 172/77 | HR 64 | Ht 76.0 in

## 2019-12-03 DIAGNOSIS — Z7189 Other specified counseling: Secondary | ICD-10-CM

## 2019-12-03 DIAGNOSIS — R55 Syncope and collapse: Secondary | ICD-10-CM

## 2019-12-03 DIAGNOSIS — R002 Palpitations: Secondary | ICD-10-CM

## 2019-12-03 NOTE — Patient Instructions (Signed)
Medication Instructions:  The current medical regimen is effective;  continue present plan and medications.  *If you need a refill on your cardiac medications before your next appointment, please call your pharmacy*   Follow-Up: At [facility], you and your health needs are our priority.  As part of our continuing mission to provide you with exceptional heart care, we have created designated Provider Care Teams.  These Care Teams include your primary Cardiologist (physician) and Advanced Practice Providers (APPs -  Physician Assistants and Nurse Practitioners) who all work together to provide you with the care you need, when you need it.  We recommend signing up for the patient portal called "MyChart".  Sign up information is provided on this After Visit Summary.  MyChart is used to connect with patients for Virtual Visits (Telemedicine).  Patients are able to view lab/test results, encounter notes, upcoming appointments, etc.  Non-urgent messages can be sent to your provider as well.   To learn more about what you can do with MyChart, go to [email or web address].    Your next appointment:   As needed  The format for your next appointment:   In Person  Provider:   James Hochrein, MD      

## 2019-12-05 ENCOUNTER — Other Ambulatory Visit: Payer: Self-pay

## 2019-12-05 ENCOUNTER — Ambulatory Visit (INDEPENDENT_AMBULATORY_CARE_PROVIDER_SITE_OTHER): Payer: Medicare Other | Admitting: *Deleted

## 2019-12-05 DIAGNOSIS — E538 Deficiency of other specified B group vitamins: Secondary | ICD-10-CM

## 2019-12-05 NOTE — Progress Notes (Signed)
Patient in for monthly B12 injection. 1000 mcg given in right deltoid. Patient tolerated well.

## 2019-12-19 ENCOUNTER — Ambulatory Visit: Payer: Medicare Other

## 2019-12-20 ENCOUNTER — Other Ambulatory Visit: Payer: Self-pay | Admitting: Psychiatry

## 2019-12-25 NOTE — Telephone Encounter (Signed)
I can't call this in since it's DAW insurance needs to see that on Rx

## 2020-01-02 ENCOUNTER — Other Ambulatory Visit: Payer: Self-pay | Admitting: *Deleted

## 2020-01-02 MED ORDER — CLOBETASOL PROPIONATE 0.05 % EX SOLN
Freq: Two times a day (BID) | CUTANEOUS | 1 refills | Status: DC
Start: 2020-01-02 — End: 2020-01-07

## 2020-01-03 ENCOUNTER — Telehealth: Payer: Self-pay | Admitting: Psychiatry

## 2020-01-03 NOTE — Telephone Encounter (Signed)
Matthew [name] wants something noted in his chart. His kidney doctor is now [name] at [facility]. The number is [phone]. He wants to make sure that [name] has the new doctor in case he ever needs to call them.

## 2020-01-06 ENCOUNTER — Other Ambulatory Visit: Payer: Self-pay

## 2020-01-06 ENCOUNTER — Telehealth: Payer: Self-pay | Admitting: Psychiatry

## 2020-01-06 ENCOUNTER — Ambulatory Visit (INDEPENDENT_AMBULATORY_CARE_PROVIDER_SITE_OTHER): Payer: Medicare Other

## 2020-01-06 DIAGNOSIS — E538 Deficiency of other specified B group vitamins: Secondary | ICD-10-CM | POA: Diagnosis not present

## 2020-01-06 NOTE — Telephone Encounter (Signed)
Pt called and states he is sleeping too much. Wondering if its one of the medicines he is on. [phone].

## 2020-01-06 NOTE — Telephone Encounter (Signed)
No recent medication changes noted?

## 2020-01-06 NOTE — Telephone Encounter (Signed)
He has been on the same medications for many years.  It is possible that as he gets older he may become a little more sensitive and we may have to reduce some of the medication but we should not do that between appointments.  We will discuss it if it still a concern at his next appointment.

## 2020-01-06 NOTE — Telephone Encounter (Signed)
Note made in chart with this new information.

## 2020-01-07 ENCOUNTER — Other Ambulatory Visit: Payer: Self-pay | Admitting: Family Medicine

## 2020-01-07 ENCOUNTER — Other Ambulatory Visit: Payer: Self-pay | Admitting: *Deleted

## 2020-01-07 ENCOUNTER — Telehealth: Payer: Self-pay | Admitting: *Deleted

## 2020-01-07 MED ORDER — CLOBETASOL PROPIONATE 0.05 % EX CREA
TOPICAL_CREAM | Freq: Two times a day (BID) | CUTANEOUS | 5 refills | Status: DC
Start: 2020-01-07 — End: 2020-02-10

## 2020-01-07 MED ORDER — CLOBETASOL PROPIONATE 0.05 % EX SOLN
Freq: Two times a day (BID) | CUTANEOUS | 1 refills | Status: DC
Start: 2020-01-07 — End: 2020-02-10

## 2020-01-07 NOTE — Telephone Encounter (Signed)
Done. Thanks, WS 

## 2020-01-07 NOTE — Telephone Encounter (Signed)
Tried reaching patient but there was no answer and no vm

## 2020-01-07 NOTE — Telephone Encounter (Signed)
Matthew [name], please let him know message

## 2020-01-07 NOTE — Telephone Encounter (Signed)
Pt would also like refill on the clobetasol cream, he has not had this in over a year, he has gotten a refill on the solution Please advise

## 2020-01-15 ENCOUNTER — Ambulatory Visit (INDEPENDENT_AMBULATORY_CARE_PROVIDER_SITE_OTHER): Payer: Medicare Other | Admitting: Family Medicine

## 2020-01-15 ENCOUNTER — Encounter: Payer: Self-pay | Admitting: Family Medicine

## 2020-01-15 ENCOUNTER — Other Ambulatory Visit: Payer: Self-pay

## 2020-01-15 VITALS — BP 138/71 | HR 68 | Temp 96.6°F | Wt 187.0 lb

## 2020-01-15 DIAGNOSIS — M549 Dorsalgia, unspecified: Secondary | ICD-10-CM

## 2020-01-15 MED ORDER — DICLOFENAC SODIUM 1 % EX GEL: 2.0000 g | Freq: Four times a day (QID) | CUTANEOUS | 1 refills | Status: DC

## 2020-01-15 NOTE — Patient Instructions (Signed)
Take tylenol. Try heating pad. Continue Voltaren gel.    Musculoskeletal Pain Musculoskeletal pain refers to aches and pains in your bones, joints, muscles, and the tissues that surround them. This pain can occur in any part of the body. It can last for a short time (acute) or a long time (chronic). A physical exam, lab tests, and imaging studies may be done to find the cause of your musculoskeletal pain. Follow these instructions at home:  Lifestyle  Try to control or lower your stress levels. Stress increases muscle tension and can worsen musculoskeletal pain. It is important to recognize when you are anxious or stressed and learn ways to manage it. This may include: ? Meditation or yoga. ? Cognitive or behavioral therapy. ? Acupuncture or massage therapy.  You may continue all activities unless the activities cause more pain. When the pain gets better, slowly resume your normal activities. Gradually increase the intensity and duration of your activities or exercise. Managing pain, stiffness, and swelling  Take over-the-counter and prescription medicines only as told by your health care provider.  When your pain is severe, bed rest may be helpful. Lie or sit in any position that is comfortable, but get out of bed and walk around at least every couple of hours.  If directed, apply heat to the affected area as often as told by your health care provider. Use the heat source that your health care provider recommends, such as a moist heat pack or a heating pad. ? Place a towel between your skin and the heat source. ? Leave the heat on for 20-30 minutes. ? Remove the heat if your skin turns bright red. This is especially important if you are unable to feel pain, heat, or cold. You may have a greater risk of getting burned.  If directed, put ice on the painful area. ? Put ice in a plastic bag. ? Place a towel between your skin and the bag. ? Leave the ice on for 20 minutes, 2-3 times a  day. General instructions  Your health care provider may recommend that you see a physical therapist. This person can help you come up with a safe exercise program. Do any exercises as told by your physical therapist.  Keep all follow-up visits, including any physical therapy visits, as told by your health care providers. This is important. Contact a health care provider if:  Your pain gets worse.  Medicines do not help ease your pain.  You cannot use the part of your body that hurts, such as your arm, leg, or neck.  You have trouble sleeping.  You have trouble doing your normal activities. Get help right away if:  You have a new injury and your pain is worse or different.  You feel numb or you have tingling in the painful area. Summary  Musculoskeletal pain refers to aches and pains in your bones, joints, muscles, and the tissues that surround them.  This pain can occur in any part of the body.  Your health care provider may recommend that you see a physical therapist. This person can help you come up with a safe exercise program. Do any exercises as told by your physical therapist.  Lower your stress level. Stress can worsen musculoskeletal pain. Ways to lower stress may include meditation, yoga, cognitive or behavioral therapy, acupuncture, and massage therapy. This information is not intended to replace advice given to you by your health care provider. Make sure you discuss any questions you have with your  health care provider. Document Revised: 02/24/2017 Document Reviewed: 04/13/2016 Elsevier Patient Education  2020 [employer].
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
## 2020-01-20 ENCOUNTER — Other Ambulatory Visit: Payer: Self-pay | Admitting: Psychiatry

## 2020-02-03 ENCOUNTER — Other Ambulatory Visit: Payer: Self-pay

## 2020-02-03 ENCOUNTER — Ambulatory Visit: Payer: Medicare Other

## 2020-02-03 DIAGNOSIS — Z0130 Encounter for examination of blood pressure without abnormal findings: Secondary | ICD-10-CM

## 2020-02-03 NOTE — Progress Notes (Signed)
Patient was concerned because he thought he had accidentally taken two of his Losartan tablets and was worried that his blood pressure may be low.  Blood pressure was checked and found to be 136/73, pulse rate 82.

## 2020-02-05 ENCOUNTER — Other Ambulatory Visit: Payer: Self-pay

## 2020-02-05 ENCOUNTER — Other Ambulatory Visit: Payer: Medicare Other

## 2020-02-05 ENCOUNTER — Other Ambulatory Visit: Payer: Self-pay | Admitting: *Deleted

## 2020-02-05 DIAGNOSIS — F259 Schizoaffective disorder, unspecified: Secondary | ICD-10-CM

## 2020-02-05 DIAGNOSIS — K2100 Gastro-esophageal reflux disease with esophagitis, without bleeding: Secondary | ICD-10-CM

## 2020-02-05 DIAGNOSIS — F411 Generalized anxiety disorder: Secondary | ICD-10-CM

## 2020-02-05 DIAGNOSIS — E538 Deficiency of other specified B group vitamins: Secondary | ICD-10-CM

## 2020-02-05 DIAGNOSIS — I10 Essential (primary) hypertension: Secondary | ICD-10-CM

## 2020-02-05 DIAGNOSIS — Z79899 Other long term (current) drug therapy: Secondary | ICD-10-CM

## 2020-02-05 DIAGNOSIS — N1831 Chronic kidney disease, stage 3a: Secondary | ICD-10-CM

## 2020-02-06 ENCOUNTER — Ambulatory Visit (INDEPENDENT_AMBULATORY_CARE_PROVIDER_SITE_OTHER): Payer: Medicare Other | Admitting: *Deleted

## 2020-02-06 DIAGNOSIS — E538 Deficiency of other specified B group vitamins: Secondary | ICD-10-CM

## 2020-02-06 LAB — LIPID PANEL
Chol/HDL Ratio: 2.9 ratio (ref 0.0–5.0)
Cholesterol, Total: 174 mg/dL (ref 100–199)
HDL: 61 mg/dL (ref >39)
LDL Chol Calc (NIH): 95 mg/dL (ref 0–99)
Triglycerides: 97 mg/dL (ref 0–149)
VLDL Cholesterol Cal: 18 mg/dL (ref 5–40)

## 2020-02-06 LAB — CBC WITH DIFFERENTIAL/PLATELET
Basophils Absolute: 0.1 10*3/uL (ref 0.0–0.2)
Basos: 1 %
EOS (ABSOLUTE): 0.1 10*3/uL (ref 0.0–0.4)
Eos: 2 %
Hematocrit: 36 % — ABNORMAL LOW (ref 37.5–51.0)
Hemoglobin: 12.1 g/dL — ABNORMAL LOW (ref 13.0–17.7)
Immature Grans (Abs): 0 10*3/uL (ref 0.0–0.1)
Immature Granulocytes: 0 %
Lymphocytes Absolute: 1.2 10*3/uL (ref 0.7–3.1)
Lymphs: 23 %
MCH: 29.6 pg (ref 26.6–33.0)
MCHC: 33.6 g/dL (ref 31.5–35.7)
MCV: 88 fL (ref 79–97)
Monocytes Absolute: 0.6 10*3/uL (ref 0.1–0.9)
Monocytes: 11 %
Neutrophils Absolute: 3.4 10*3/uL (ref 1.4–7.0)
Neutrophils: 63 %
Platelets: 213 10*3/uL (ref 150–450)
RBC: 4.09 x10E6/uL — ABNORMAL LOW (ref 4.14–5.80)
RDW: 12.7 % (ref 11.6–15.4)
WBC: 5.3 10*3/uL (ref 3.4–10.8)

## 2020-02-06 LAB — CMP14+EGFR
ALT: 27 IU/L (ref 0–44)
AST: 27 IU/L (ref 0–40)
Albumin/Globulin Ratio: 1.7 (ref 1.2–2.2)
Albumin: 4.6 g/dL (ref 3.8–4.8)
Alkaline Phosphatase: 138 IU/L — ABNORMAL HIGH (ref 44–121)
BUN/Creatinine Ratio: 16 (ref 10–24)
BUN: 28 mg/dL — ABNORMAL HIGH (ref 8–27)
Bilirubin Total: 0.3 mg/dL (ref 0.0–1.2)
CO2: 23 mmol/L (ref 20–29)
Calcium: 9.3 mg/dL (ref 8.6–10.2)
Chloride: 98 mmol/L (ref 96–106)
Creatinine, Ser: 1.78 mg/dL — ABNORMAL HIGH (ref 0.76–1.27)
GFR calc Af Amer: 45 mL/min/{1.73_m2} — ABNORMAL LOW (ref >59)
GFR calc non Af Amer: 39 mL/min/{1.73_m2} — ABNORMAL LOW (ref >59)
Globulin, Total: 2.7 g/dL (ref 1.5–4.5)
Glucose: 102 mg/dL — ABNORMAL HIGH (ref 65–99)
Potassium: 4.5 mmol/L (ref 3.5–5.2)
Sodium: 134 mmol/L (ref 134–144)
Total Protein: 7.3 g/dL (ref 6.0–8.5)

## 2020-02-10 ENCOUNTER — Other Ambulatory Visit: Payer: Self-pay

## 2020-02-10 ENCOUNTER — Ambulatory Visit (INDEPENDENT_AMBULATORY_CARE_PROVIDER_SITE_OTHER): Payer: Medicare Other | Admitting: Family Medicine

## 2020-02-10 ENCOUNTER — Encounter: Payer: Self-pay | Admitting: Family Medicine

## 2020-02-10 VITALS — BP 118/60 | HR 76 | Temp 97.7°F | Ht 76.0 in | Wt 185.2 lb

## 2020-02-10 DIAGNOSIS — I10 Essential (primary) hypertension: Secondary | ICD-10-CM

## 2020-02-10 DIAGNOSIS — K2100 Gastro-esophageal reflux disease with esophagitis, without bleeding: Secondary | ICD-10-CM | POA: Diagnosis not present

## 2020-02-10 DIAGNOSIS — R002 Palpitations: Secondary | ICD-10-CM | POA: Diagnosis not present

## 2020-02-10 DIAGNOSIS — M549 Dorsalgia, unspecified: Secondary | ICD-10-CM

## 2020-02-10 DIAGNOSIS — F1320 Sedative, hypnotic or anxiolytic dependence, uncomplicated: Secondary | ICD-10-CM | POA: Diagnosis not present

## 2020-02-10 DIAGNOSIS — N1832 Chronic kidney disease, stage 3b: Secondary | ICD-10-CM

## 2020-02-10 DIAGNOSIS — L719 Rosacea, unspecified: Secondary | ICD-10-CM

## 2020-02-10 MED ORDER — CLOBETASOL PROPIONATE 0.05 % EX SOLN
Freq: Two times a day (BID) | CUTANEOUS | 1 refills | Status: DC
Start: 2020-02-10 — End: 2021-03-07

## 2020-02-10 MED ORDER — LOSARTAN POTASSIUM 100 MG PO TABS: 100.0000 mg | ORAL_TABLET | Freq: Every morning | ORAL | 1 refills | Status: DC

## 2020-02-10 MED ORDER — METRONIDAZOLE 0.75 % EX CREA: TOPICAL_CREAM | Freq: Two times a day (BID) | CUTANEOUS | 11 refills | Status: DC

## 2020-02-10 MED ORDER — DICLOFENAC SODIUM 1 % EX GEL: 2.0000 g | Freq: Four times a day (QID) | CUTANEOUS | 1 refills | Status: AC

## 2020-02-10 MED ORDER — CLOBETASOL PROPIONATE 0.05 % EX CREA
TOPICAL_CREAM | Freq: Two times a day (BID) | CUTANEOUS | 5 refills | Status: DC
Start: 2020-02-10 — End: 2020-07-06

## 2020-02-10 MED ORDER — AMLODIPINE BESYLATE 5 MG PO TABS: 5.0000 mg | ORAL_TABLET | Freq: Every day | ORAL | 1 refills | Status: DC

## 2020-02-10 MED ORDER — FAMOTIDINE 20 MG PO TABS
20.0000 mg | ORAL_TABLET | Freq: Every day | ORAL | 3 refills | Status: DC
Start: 2020-02-10 — End: 2021-02-16

## 2020-02-10 NOTE — Progress Notes (Signed)
Subjective:  Patient ID: Matthew [name], male    DOB: Jul 22, 1950  Age: 69 y.o. MRN: [id number]  CC: Follow-up (4 month)   HPI Matthew [name] presents for  follow-up of hypertension. Patient has no history of headache chest pain or shortness of breath or recent cough. Patient also denies symptoms of TIA such as focal numbness or weakness. Patient denies side effects from medication. States taking it regularly.  Patient reports an episode of palpitations recently.  This was not accompanied by chest pain shortness of breath syncope near syncope or other symptoms.  It resolved within a few seconds.  It has not been repeated.   History Matthew [name] has a past medical history of Agoraphobia, Anemia, Anxiety, Arthritis, Asthma, Barrett esophagus, Blood transfusion without reported diagnosis, Bronchitis, Chronic kidney disease, Complication of anesthesia, Elevated serum creatinine, Esophageal stricture, GERD (gastroesophageal reflux disease), Hypertension, Mood disorder ([place]), Neck pain, chronic, Neuromuscular disorder ([place]), Rosacea, and Vitamin B12 deficiency.   Matthew [name] has a past surgical history that includes Chest tube insertion; Fracture surgery; Wisdom tooth extraction; Lumbar laminectomy/decompression microdiscectomy (12/06/2011); Colonoscopy (08/20/2003); and Upper gastrointestinal endoscopy (03/15/2013).   His family history includes Diabetes in his mother; Heart disease in his mother; Stroke in his mother.Matthew [name] reports that Matthew [name] quit smoking about 34 years ago. His smoking use included cigarettes. Matthew [name] has a 0.90 pack-year smoking history. Matthew [name] has never used smokeless tobacco. Matthew [name] reports that Matthew [name] does not drink alcohol and does not use drugs.  Current Outpatient Medications on File Prior to Visit  Medication Sig Dispense Refill  . ATIVAN 1 MG tablet TAKE ONE TABLET FOUR TIMES DAILY. 120 tablet 2  . chlorproMAZINE (THORAZINE) 100 MG tablet TAKE 1 TABLET 4 TIMES A DAY 120 tablet 2  . esomeprazole (NEXIUM) 40  MG capsule Take 1 capsule (40 mg total) by mouth daily. 90 capsule 3  . fluticasone (FLONASE) 50 MCG/ACT nasal spray 2 SPRAYS IN EACH NOSTRIL ONCE A DAY AS NEEDED 48 g 3  . TEGRETOL 200 MG tablet TAKE (1/2) TABLET FOUR TIMES DAILY. 180 tablet 1  . trihexyphenidyl (ARTANE) 2 MG tablet TAKE  (1)  TABLET TWICE A DAY WITH MEALS (BREAKFAST AND SUPPER) 180 tablet 1   No current facility-administered medications on file prior to visit.    ROS Review of Systems  Constitutional: Negative for fever.  Respiratory: Negative for shortness of breath.   Cardiovascular: Positive for palpitations. Negative for chest pain and leg swelling.  Musculoskeletal: Negative for arthralgias.  Skin: Negative for rash.    Objective:  BP 118/60   Pulse 76   Temp 97.7 F (36.5 C) (Temporal)   Ht 6\' 4"  (1.93 m)   Wt 185 lb 3.2 oz (84 kg)   BMI 22.54 kg/m   BP Readings from Last 3 Encounters:  02/10/20 118/60  01/15/20 138/71  12/03/19 (!) 172/77    Wt Readings from Last 3 Encounters:  02/10/20 185 lb 3.2 oz (84 kg)  01/15/20 187 lb (84.8 kg)  10/10/19 186 lb 2 oz (84.4 kg)     Physical Exam Vitals reviewed.  Constitutional:      Appearance: Matthew [name] is well-developed.  HENT:     Head: Normocephalic and atraumatic.     Right Ear: Tympanic membrane and external ear normal. No decreased hearing noted.     Left Ear: Tympanic membrane and external ear normal. No decreased hearing noted.     Mouth/Throat:     Pharynx: No oropharyngeal exudate or posterior oropharyngeal erythema.  Eyes:  Pupils: Pupils are equal, round, and reactive to light.  Cardiovascular:     Rate and Rhythm: Normal rate and regular rhythm.     Heart sounds: No murmur heard.   Pulmonary:     Effort: No respiratory distress.     Breath sounds: Normal breath sounds.  Abdominal:     General: Bowel sounds are normal.     Palpations: Abdomen is soft. There is no mass.     Tenderness: There is no abdominal tenderness.    Musculoskeletal:     Cervical back: Normal range of motion and neck supple.       Assessment & Plan:   Matthew [name] was seen today for follow-up.  Diagnoses and all orders for this visit:  Palpitations  Essential hypertension -     amLODipine (NORVASC) 5 MG tablet; Take 1 tablet (5 mg total) by mouth daily. Take in the evening -     losartan (COZAAR) 100 MG tablet; Take 1 tablet (100 mg total) by mouth every morning.  Benzodiazepine dependence (HCC)  Gastroesophageal reflux disease with esophagitis without hemorrhage  Upper back pain -     diclofenac Sodium (VOLTAREN) 1 % GEL; Apply 2 g topically 4 (four) times daily.  Acne rosacea -     metroNIDAZOLE (METROCREAM) 0.75 % cream; Apply topically 2 (two) times daily.  Chronic renal impairment, stage 3b (HCC)  Other orders -     clobetasol (TEMOVATE) 0.05 % external solution; Apply topically 2 (two) times daily. -     clobetasol cream (TEMOVATE) 0.05 %; Apply topically 2 (two) times daily. -     famotidine (PEPCID) 20 MG tablet; Take 1 tablet (20 mg total) by mouth at bedtime.   Allergies as of 02/10/2020      Reactions   Acetaminophen Nausea And Vomiting   Amoxicillin    REACTION: unspecified/unknown   Cephalexin Swelling   In hands   Erythromycin    REACTION:Increases tegretol level   Nsaids Other (See Comments)   REACTION: Cannot take due to kidney health   Penicillins    REACTION: Unknown      Medication List       Accurate as of February 10, 2020  6:24 PM. If you have any questions, ask your nurse or doctor.        amLODipine 5 MG tablet Commonly known as: NORVASC Take 1 tablet (5 mg total) by mouth daily. Take in the evening   Ativan 1 MG tablet Generic drug: LORazepam TAKE ONE TABLET FOUR TIMES DAILY.   chlorproMAZINE 100 MG tablet Commonly known as: THORAZINE TAKE 1 TABLET 4 TIMES A DAY   clobetasol 0.05 % external solution Commonly known as: TEMOVATE Apply topically 2 (two) times daily.    clobetasol cream 0.05 % Commonly known as: TEMOVATE Apply topically 2 (two) times daily.   diclofenac Sodium 1 % Gel Commonly known as: Voltaren Apply 2 g topically 4 (four) times daily.   esomeprazole 40 MG capsule Commonly known as: NEXIUM Take 1 capsule (40 mg total) by mouth daily.   famotidine 20 MG tablet Commonly known as: PEPCID Take 1 tablet (20 mg total) by mouth at bedtime.   fluticasone 50 MCG/ACT nasal spray Commonly known as: FLONASE 2 SPRAYS IN EACH NOSTRIL ONCE A DAY AS NEEDED   losartan 100 MG tablet Commonly known as: COZAAR Take 1 tablet (100 mg total) by mouth every morning.   metroNIDAZOLE 0.75 % cream Commonly known as: METROCREAM Apply topically 2 (two) times daily.  TEGretol 200 MG tablet Generic drug: carbamazepine TAKE (1/2) TABLET FOUR TIMES DAILY.   trihexyphenidyl 2 MG tablet Commonly known as: ARTANE TAKE  (1)  TABLET TWICE A DAY WITH MEALS (BREAKFAST AND SUPPER)       Meds ordered this encounter  Medications  . amLODipine (NORVASC) 5 MG tablet    Sig: Take 1 tablet (5 mg total) by mouth daily. Take in the evening    Dispense:  90 tablet    Refill:  1  . clobetasol (TEMOVATE) 0.05 % external solution    Sig: Apply topically 2 (two) times daily.    Dispense:  50 mL    Refill:  1  . clobetasol cream (TEMOVATE) 0.05 %    Sig: Apply topically 2 (two) times daily.    Dispense:  45 g    Refill:  5  . diclofenac Sodium (VOLTAREN) 1 % GEL    Sig: Apply 2 g topically 4 (four) times daily.    Dispense:  100 g    Refill:  1  . famotidine (PEPCID) 20 MG tablet    Sig: Take 1 tablet (20 mg total) by mouth at bedtime.    Dispense:  90 tablet    Refill:  3  . losartan (COZAAR) 100 MG tablet    Sig: Take 1 tablet (100 mg total) by mouth every morning.    Dispense:  90 tablet    Refill:  1  . metroNIDAZOLE (METROCREAM) 0.75 % cream    Sig: Apply topically 2 (two) times daily.    Dispense:  45 g    Refill:  11      Follow-up:  Return in about 6 months (around 08/09/2020).  Claretta Fraise, M.D.

## 2020-03-05 ENCOUNTER — Encounter: Payer: Self-pay | Admitting: Psychiatry

## 2020-03-05 ENCOUNTER — Telehealth (INDEPENDENT_AMBULATORY_CARE_PROVIDER_SITE_OTHER): Payer: Medicare Other | Admitting: Psychiatry

## 2020-03-05 DIAGNOSIS — G3184 Mild cognitive impairment, so stated: Secondary | ICD-10-CM

## 2020-03-05 DIAGNOSIS — F411 Generalized anxiety disorder: Secondary | ICD-10-CM | POA: Diagnosis not present

## 2020-03-05 DIAGNOSIS — Z87820 Personal history of traumatic brain injury: Secondary | ICD-10-CM

## 2020-03-05 DIAGNOSIS — F259 Schizoaffective disorder, unspecified: Secondary | ICD-10-CM

## 2020-03-05 DIAGNOSIS — G2119 Other drug induced secondary parkinsonism: Secondary | ICD-10-CM

## 2020-03-05 DIAGNOSIS — F4001 Agoraphobia with panic disorder: Secondary | ICD-10-CM

## 2020-03-05 MED ORDER — CARBAMAZEPINE 200 MG PO TABS: ORAL_TABLET | ORAL | 3 refills | Status: DC

## 2020-03-05 MED ORDER — ATIVAN 1 MG PO TABS: 1.0000 mg | ORAL_TABLET | Freq: Four times a day (QID) | ORAL | 5 refills | Status: DC

## 2020-03-05 MED ORDER — TRIHEXYPHENIDYL HCL 2 MG PO TABS: ORAL_TABLET | ORAL | 3 refills | Status: DC

## 2020-03-05 MED ORDER — CHLORPROMAZINE HCL 100 MG PO TABS: ORAL_TABLET | ORAL | 1 refills | Status: DC
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
## 2020-03-09 ENCOUNTER — Other Ambulatory Visit: Payer: Self-pay

## 2020-03-09 ENCOUNTER — Ambulatory Visit (INDEPENDENT_AMBULATORY_CARE_PROVIDER_SITE_OTHER): Payer: Medicare Other

## 2020-03-09 DIAGNOSIS — E538 Deficiency of other specified B group vitamins: Secondary | ICD-10-CM

## 2020-03-09 MED ORDER — CYANOCOBALAMIN 1000 MCG/ML IJ SOLN
1000.0000 ug | INTRAMUSCULAR | Status: AC
  Administered 2020-03-09 – 2021-02-22 (×12): 1000 ug via INTRAMUSCULAR

## 2020-03-09 NOTE — Progress Notes (Signed)
Cyanocobalamin injection given to left deltoid.  Patient tolerated well. 

## 2020-03-19 ENCOUNTER — Other Ambulatory Visit: Payer: Self-pay | Admitting: Psychiatry

## 2020-03-19 DIAGNOSIS — F4001 Agoraphobia with panic disorder: Secondary | ICD-10-CM

## 2020-03-19 DIAGNOSIS — F411 Generalized anxiety disorder: Secondary | ICD-10-CM

## 2020-03-31 ENCOUNTER — Encounter: Payer: Self-pay | Admitting: Nurse Practitioner

## 2020-03-31 ENCOUNTER — Other Ambulatory Visit: Payer: Self-pay

## 2020-03-31 ENCOUNTER — Ambulatory Visit (INDEPENDENT_AMBULATORY_CARE_PROVIDER_SITE_OTHER): Payer: Medicare Other | Admitting: Nurse Practitioner

## 2020-03-31 VITALS — BP 107/54 | HR 72 | Temp 97.1°F | Ht 76.0 in | Wt 186.4 lb

## 2020-03-31 DIAGNOSIS — F411 Generalized anxiety disorder: Secondary | ICD-10-CM

## 2020-03-31 DIAGNOSIS — I10 Essential (primary) hypertension: Secondary | ICD-10-CM | POA: Diagnosis not present

## 2020-03-31 DIAGNOSIS — R002 Palpitations: Secondary | ICD-10-CM | POA: Diagnosis not present

## 2020-03-31 NOTE — Assessment & Plan Note (Signed)
Blood pressure [essential hypertension] well-controlled on current medication losartan 100 mg tablet by mouth daily.  And amlodipine 5 mg tablet by mouth daily.  Continue low-sodium diet and exercise as tolerated.
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
## 2020-03-31 NOTE — Assessment & Plan Note (Signed)
Well-controlled on current medication no changes necessary to dose current medication Ativan 1 mg tablet 4 times daily.

## 2020-03-31 NOTE — Assessment & Plan Note (Signed)
Patient presents with palpitation.  Patient was seen few months ago for same symptoms.  Follow up was scheduled for 6 months.  Patient reports having palpitation in the last 24 hours and recalls drinking a lot of coffee and chocolate drink during holidays.  Completed EKG and comparing to EKG from September patient is normal sinus rhythm, heart rate and pulse is normal.  Patient has also been seen and evaluated by cardiology Dr. Antoine Poche in the past.  Advised patient to reduce caffeine intake, stress and anxiety.  Provided education to patient with printed handouts given.  Follow-up with worsening or unresolved symptoms.  Or follow-up in 6 months with PCP as previously  scheduled.

## 2020-03-31 NOTE — Patient Instructions (Signed)
Palpitations Palpitations are feelings that your heartbeat is not normal. Your heartbeat may feel like it is:  Uneven.  Faster than normal.  Fluttering.  Skipping a beat. This is usually not a serious problem. In some cases, you may need tests to rule out any serious problems. Follow these instructions at home: Pay attention to any changes in your condition. Take these actions to help manage your symptoms: Eating and drinking  Avoid: ? Coffee, tea, soft drinks, and energy drinks. ? Chocolate. ? Alcohol. ? Diet pills. Lifestyle   Try to lower your stress. These things can help you relax: ? Yoga. ? Deep breathing and meditation. ? Exercise. ? Using words and images to create positive thoughts (guided imagery). ? Using your mind to control things in your body (biofeedback).  Do not use drugs.  Get plenty of rest and sleep. Keep a regular bed time. General instructions   Take over-the-counter and prescription medicines only as told by your doctor.  Do not use any products that contain nicotine or tobacco, such as cigarettes and e-cigarettes. If you need help quitting, ask your doctor.  Keep all follow-up visits as told by your doctor. This is important. You may need more tests if palpitations do not go away or get worse. Contact a doctor if:  Your symptoms last more than 24 hours.  Your symptoms occur more often. Get help right away if you:  Have chest pain.  Feel short of breath.  Have a very bad headache.  Feel dizzy.  Pass out (faint). Summary  Palpitations are feelings that your heartbeat is uneven or faster than normal. It may feel like your heart is fluttering or skipping a beat.  Avoid food and drinks that may cause palpitations. These include caffeine, chocolate, and alcohol.  Try to lower your stress. Do not smoke or use drugs.  Get help right away if you faint or have chest pain, shortness of breath, a severe headache, or dizziness. This  information is not intended to replace advice given to you by your health care provider. Make sure you discuss any questions you have with your health care provider. Document Revised: 04/26/2017 Document Reviewed: 04/26/2017 Elsevier Patient Education  2020 Elsevier Inc.  

## 2020-04-09 ENCOUNTER — Ambulatory Visit (INDEPENDENT_AMBULATORY_CARE_PROVIDER_SITE_OTHER): Payer: Medicare Other | Admitting: *Deleted

## 2020-04-09 ENCOUNTER — Other Ambulatory Visit: Payer: Self-pay

## 2020-04-09 DIAGNOSIS — E538 Deficiency of other specified B group vitamins: Secondary | ICD-10-CM

## 2020-05-11 ENCOUNTER — Ambulatory Visit: Payer: Medicare Other

## 2020-05-12 ENCOUNTER — Ambulatory Visit (INDEPENDENT_AMBULATORY_CARE_PROVIDER_SITE_OTHER): Payer: Medicare Other | Admitting: *Deleted

## 2020-05-12 ENCOUNTER — Other Ambulatory Visit: Payer: Self-pay

## 2020-05-12 DIAGNOSIS — E538 Deficiency of other specified B group vitamins: Secondary | ICD-10-CM

## 2020-05-19 ENCOUNTER — Other Ambulatory Visit: Payer: Self-pay

## 2020-05-19 ENCOUNTER — Encounter: Payer: Self-pay | Admitting: Nurse Practitioner

## 2020-05-19 ENCOUNTER — Ambulatory Visit (INDEPENDENT_AMBULATORY_CARE_PROVIDER_SITE_OTHER): Payer: Medicare Other | Admitting: Nurse Practitioner

## 2020-05-19 VITALS — BP 120/61 | HR 71 | Temp 96.8°F | Ht 76.0 in | Wt 183.8 lb

## 2020-05-19 DIAGNOSIS — M5489 Other dorsalgia: Secondary | ICD-10-CM | POA: Diagnosis not present

## 2020-05-19 MED ORDER — LIDOCAINE 5 % EX PTCH
1.0000 | MEDICATED_PATCH | CUTANEOUS | 0 refills | Status: DC
Start: 2020-05-19 — End: 2021-04-23
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
## 2020-05-19 NOTE — Assessment & Plan Note (Signed)
Back pain without sciatica-symptoms not well controlled.  Patient had a history of herniated disc surgery in 2013 started.  Patient reports he did fine for 3 years and then took a turn for the worse.  Today he rates his pain moderate 5/10 out of a pain scale of 0-10.  Started Lidoderm patch 5%.  Continue Voltaren gel.  Follow-up for worsening unresolved symptoms.  Rx sent to pharmacy.

## 2020-05-19 NOTE — Patient Instructions (Signed)
Worsening lower back pain.  Started patient on 5% Lidoderm patch.  Follow-up for worsening unresolved symptoms.   Acute Back Pain, Adult Acute back pain is sudden and usually short-lived. It is often caused by an injury to the muscles and tissues in the back. The injury may result from:  A muscle or ligament getting overstretched or torn (strained). Ligaments are tissues that connect bones to each other. Lifting something improperly can cause a back strain.  Wear and tear (degeneration) of the spinal disks. Spinal disks are circular tissue that provide cushioning between the bones of the spine (vertebrae).  Twisting motions, such as while playing sports or doing yard work.  A hit to the back.  Arthritis. You may have a physical exam, lab tests, and imaging tests to find the cause of your pain. Acute back pain usually goes away with rest and home care. Follow these instructions at home: Managing pain, stiffness, and swelling  Treatment may include medicines for pain and inflammation that are taken by mouth or applied to the skin, prescription pain medicine, or muscle relaxants. Take over-the-counter and prescription medicines only as told by your health care provider.  Your health care provider may recommend applying ice during the first 24-48 hours after your pain starts. To do this: ? Put ice in a plastic bag. ? Place a towel between your skin and the bag. ? Leave the ice on for 20 minutes, 2-3 times a day.  If directed, apply heat to the affected area as often as told by your health care provider. Use the heat source that your health care provider recommends, such as a moist heat pack or a heating pad. ? Place a towel between your skin and the heat source. ? Leave the heat on for 20-30 minutes. ? Remove the heat if your skin turns bright red. This is especially important if you are unable to feel pain, heat, or cold. You have a greater risk of getting burned. Activity  Do not stay  in bed. Staying in bed for more than 1-2 days can delay your recovery.  Sit up and stand up straight. Avoid leaning forward when you sit or hunching over when you stand. ? If you work at a desk, sit close to it so you do not need to lean over. Keep your chin tucked in. Keep your neck drawn back, and keep your elbows bent at a 90-degree angle (right angle). ? Sit high and close to the steering wheel when you drive. Add lower back (lumbar) support to your car seat, if needed.  Take short walks on even surfaces as soon as you are able. Try to increase the length of time you walk each day.  Do not sit, drive, or stand in one place for more than 30 minutes at a time. Sitting or standing for long periods of time can put stress on your back.  Do not drive or use heavy machinery while taking prescription pain medicine.  Use proper lifting techniques. When you bend and lift, use positions that put less stress on your back: ? [place] your knees. ? Keep the load close to your body. ? Avoid twisting.  Exercise regularly as told by your health care provider. Exercising helps your back heal faster and helps prevent back injuries by keeping muscles strong and flexible.  Work with a physical therapist to make a safe exercise program, as recommended by your health care provider. Do any exercises as told by your physical therapist.  Lifestyle  Maintain a healthy weight. Extra weight puts stress on your back and makes it difficult to have good posture.  Avoid activities or situations that make you feel anxious or stressed. Stress and anxiety increase muscle tension and can make back pain worse. Learn ways to manage anxiety and stress, such as through exercise. General instructions  Sleep on a firm mattress in a comfortable position. Try lying on your side with your knees slightly bent. If you lie on your back, put a pillow under your knees.  Follow your treatment plan as told by your health care provider.  This may include: ? Cognitive or behavioral therapy. ? Acupuncture or massage therapy. ? Meditation or yoga. Contact a health care provider if:  You have pain that is not relieved with rest or medicine.  You have increasing pain going down into your legs or buttocks.  Your pain does not improve after 2 weeks.  You have pain at night.  You lose weight without trying.  You have a fever or chills. Get help right away if:  You develop new bowel or bladder control problems.  You have unusual weakness or numbness in your arms or legs.  You develop nausea or vomiting.  You develop abdominal pain.  You feel faint. Summary  Acute back pain is sudden and usually short-lived.  Use proper lifting techniques. When you bend and lift, use positions that put less stress on your back.  Take over-the-counter and prescription medicines and apply heat or ice as directed by your health care provider. This information is not intended to replace advice given to you by your health care provider. Make sure you discuss any questions you have with your health care provider. Document Revised: 12/06/2019 Document Reviewed: 12/06/2019 Elsevier Patient Education  2021 [employer].

## 2020-06-04 ENCOUNTER — Other Ambulatory Visit: Payer: Self-pay | Admitting: *Deleted

## 2020-06-04 ENCOUNTER — Other Ambulatory Visit: Payer: Self-pay

## 2020-06-04 ENCOUNTER — Other Ambulatory Visit: Payer: Medicare Other

## 2020-06-04 DIAGNOSIS — I10 Essential (primary) hypertension: Secondary | ICD-10-CM

## 2020-06-04 LAB — CBC WITH DIFFERENTIAL/PLATELET
Basophils Absolute: 0 10*3/uL (ref 0.0–0.2)
Basos: 1 %
EOS (ABSOLUTE): 0.1 10*3/uL (ref 0.0–0.4)
Eos: 3 %
Hematocrit: 33.8 % — ABNORMAL LOW (ref 37.5–51.0)
Hemoglobin: 11.6 g/dL — ABNORMAL LOW (ref 13.0–17.7)
Immature Grans (Abs): 0 10*3/uL (ref 0.0–0.1)
Immature Granulocytes: 0 %
Lymphocytes Absolute: 0.9 10*3/uL (ref 0.7–3.1)
Lymphs: 18 %
MCH: 30.4 pg (ref 26.6–33.0)
MCHC: 34.3 g/dL (ref 31.5–35.7)
MCV: 89 fL (ref 79–97)
Monocytes Absolute: 0.5 10*3/uL (ref 0.1–0.9)
Monocytes: 10 %
Neutrophils Absolute: 3.2 10*3/uL (ref 1.4–7.0)
Neutrophils: 68 %
Platelets: 184 10*3/uL (ref 150–450)
RBC: 3.82 x10E6/uL — ABNORMAL LOW (ref 4.14–5.80)
RDW: 12.5 % (ref 11.6–15.4)
WBC: 4.7 10*3/uL (ref 3.4–10.8)

## 2020-06-04 LAB — CMP14+EGFR
ALT: 22 IU/L (ref 0–44)
AST: 25 IU/L (ref 0–40)
Albumin/Globulin Ratio: 1.4 (ref 1.2–2.2)
Albumin: 4.3 g/dL (ref 3.8–4.8)
Alkaline Phosphatase: 124 IU/L — ABNORMAL HIGH (ref 44–121)
BUN/Creatinine Ratio: 10 (ref 10–24)
BUN: 17 mg/dL (ref 8–27)
Bilirubin Total: 0.4 mg/dL (ref 0.0–1.2)
CO2: 21 mmol/L (ref 20–29)
Calcium: 9.2 mg/dL (ref 8.6–10.2)
Chloride: 103 mmol/L (ref 96–106)
Creatinine, Ser: 1.7 mg/dL — ABNORMAL HIGH (ref 0.76–1.27)
Globulin, Total: 3 g/dL (ref 1.5–4.5)
Glucose: 107 mg/dL — ABNORMAL HIGH (ref 65–99)
Potassium: 4.5 mmol/L (ref 3.5–5.2)
Sodium: 141 mmol/L (ref 134–144)
Total Protein: 7.3 g/dL (ref 6.0–8.5)
eGFR: 44 mL/min/{1.73_m2} — ABNORMAL LOW (ref >59)

## 2020-06-09 ENCOUNTER — Other Ambulatory Visit: Payer: Self-pay

## 2020-06-09 ENCOUNTER — Encounter: Payer: Self-pay | Admitting: Family Medicine

## 2020-06-09 ENCOUNTER — Ambulatory Visit (INDEPENDENT_AMBULATORY_CARE_PROVIDER_SITE_OTHER): Payer: Medicare Other | Admitting: Family Medicine

## 2020-06-09 VITALS — BP 108/58 | HR 71 | Temp 97.2°F | Ht 76.0 in | Wt 182.6 lb

## 2020-06-09 DIAGNOSIS — F411 Generalized anxiety disorder: Secondary | ICD-10-CM

## 2020-06-09 DIAGNOSIS — Z23 Encounter for immunization: Secondary | ICD-10-CM | POA: Diagnosis not present

## 2020-06-09 DIAGNOSIS — I10 Essential (primary) hypertension: Secondary | ICD-10-CM | POA: Diagnosis not present

## 2020-06-09 NOTE — Progress Notes (Signed)
Subjective:  Patient ID: Matthew [name], male    DOB: [birth year]  Age: 70 y.o. MRN: [id number]  CC: Medical Management of Chronic Issues   HPI Matthew [name] presents for memory issues. Seeing psychiatry for mental illness. Worried about becoming forgetful.  He has not had significant palpitations recently. There is no dyspnea or chest pain.    Follow-up of hypertension. Patient has no history of headache chest pain or shortness of breath or recent cough. Patient also denies symptoms of TIA such as numbness weakness lateralizing. Patient checks  blood pressure at home and has not had any elevated readings recently. Patient denies side effects from his medication. States taking it regularly.  Depression screen [name] 2/9 06/09/2020 05/19/2020 03/31/2020  Decreased Interest 0 0 0  Down, Depressed, Hopeless 0 0 0  PHQ - 2 Score 0 0 0   . History [name] has a past medical history of Agoraphobia, Anemia, Anxiety, Arthritis, Asthma, Barrett esophagus, Blood transfusion without reported diagnosis, Bronchitis, Chronic kidney disease, Complication of anesthesia, Elevated serum creatinine, Esophageal stricture, GERD (gastroesophageal reflux disease), Hypertension, Mood disorder ([place]), Neck pain, chronic, Neuromuscular disorder ([place]), Rosacea, and Vitamin B12 deficiency.   He has a past surgical history that includes Chest tube insertion; Fracture surgery; Wisdom tooth extraction; Lumbar laminectomy/decompression microdiscectomy (12/06/2011); Colonoscopy (08/20/2003); Upper gastrointestinal endoscopy (03/15/2013); and skin cancer removal from forehead.   His family history includes Diabetes in his mother; Heart disease in his mother; Stroke in his mother.He reports that he quit smoking about 35 years ago. His smoking use included cigarettes. He has a 0.90 pack-year smoking history. He has never used smokeless tobacco. He reports that he does not drink alcohol and does not use drugs.    ROS Review of  Systems  Constitutional: Negative for fever.  Respiratory: Negative for shortness of breath.   Cardiovascular: Negative for chest pain.  Musculoskeletal: Negative for arthralgias.  Skin: Negative for rash.    Objective:  BP (!) 108/58   Pulse 71   Temp (!) 97.2 F (36.2 C)   Ht 6\' 4"  (1.93 m)   Wt 182 lb 9.6 oz (82.8 kg)   SpO2 100%   BMI 22.23 kg/m   BP Readings from Last 3 Encounters:  06/09/20 (!) 108/58  05/19/20 120/61  03/31/20 (!) 107/54    Wt Readings from Last 3 Encounters:  06/09/20 182 lb 9.6 oz (82.8 kg)  05/19/20 183 lb 12.8 oz (83.4 kg)  03/31/20 186 lb 6.4 oz (84.6 kg)     Physical Exam Vitals reviewed.  Constitutional:      Appearance: He is well-developed.  HENT:     Head: Normocephalic and atraumatic.     Right Ear: Tympanic membrane and external ear normal. No decreased hearing noted.     Left Ear: Tympanic membrane and external ear normal. No decreased hearing noted.     Mouth/Throat:     Pharynx: No oropharyngeal exudate or posterior oropharyngeal erythema.  Eyes:     Pupils: Pupils are equal, round, and reactive to light.  Cardiovascular:     Rate and Rhythm: Normal rate and regular rhythm.     Heart sounds: No murmur heard.   Pulmonary:     Effort: No respiratory distress.     Breath sounds: Normal breath sounds.  Abdominal:     General: Bowel sounds are normal.     Palpations: Abdomen is soft. There is no mass.     Tenderness: There is no abdominal tenderness.  Musculoskeletal:  Cervical back: Normal range of motion and neck supple.       Assessment & Plan:   [name] was seen today for medical management of chronic issues.  Diagnoses and all orders for this visit:  Essential hypertension  Need for vaccination against Streptococcus pneumoniae -     Pneumococcal conjugate vaccine 13-valent  Need for Tdap vaccination -     Tdap vaccine greater than or equal to 7yo IM  GAD (generalized anxiety disorder)    Mr.  Statzer is under the care of psychiatry [name].  I suggested he follow-up with him for the memory issues for now.  As some of his psychiatric medications may mimic memory loss issues.  There is no overt sign of dementia during today's exam.  Patient was reassured.  I am having [name] maintain his fluticasone, esomeprazole, amLODipine, clobetasol, clobetasol cream, diclofenac Sodium, famotidine, losartan, metroNIDAZOLE, chlorproMAZINE, carbamazepine, trihexyphenidyl, Ativan, and lidocaine. We will continue to administer cyanocobalamin.  Allergies as of 06/09/2020      Reactions   Acetaminophen Nausea And Vomiting   Amoxicillin    REACTION: unspecified/unknown   Cephalexin Swelling   In hands   Erythromycin    REACTION:Increases tegretol level   Nsaids Other (See Comments)   REACTION: Cannot take due to kidney health   Penicillins    REACTION: Unknown      Medication List       Accurate as of June 09, 2020 11:59 PM. If you have any questions, ask your nurse or doctor.        amLODipine 5 MG tablet Commonly known as: NORVASC Take 1 tablet (5 mg total) by mouth daily. Take in the evening   Ativan 1 MG tablet Generic drug: LORazepam TAKE ONE TABLET FOUR TIMES DAILY.   carbamazepine 200 MG tablet Commonly known as: TEGretol TAKE (1/2) TABLET FOUR TIMES DAILY.   chlorproMAZINE 100 MG tablet Commonly known as: THORAZINE TAKE 1 TABLET 4 TIMES A DAY   clobetasol 0.05 % external solution Commonly known as: TEMOVATE Apply topically 2 (two) times daily.   clobetasol cream 0.05 % Commonly known as: TEMOVATE Apply topically 2 (two) times daily.   diclofenac Sodium 1 % Gel Commonly known as: Voltaren Apply 2 g topically 4 (four) times daily.   esomeprazole 40 MG capsule Commonly known as: NEXIUM Take 1 capsule (40 mg total) by mouth daily.   famotidine 20 MG tablet Commonly known as: PEPCID Take 1 tablet (20 mg total) by mouth at bedtime.   fluticasone  50 MCG/ACT nasal spray Commonly known as: FLONASE 2 SPRAYS IN EACH NOSTRIL ONCE A DAY AS NEEDED   lidocaine 5 % Commonly known as: Lidoderm Place 1 patch onto the skin daily. Remove & Discard patch within 12 hours or as directed by MD   losartan 100 MG tablet Commonly known as: COZAAR Take 1 tablet (100 mg total) by mouth every morning.   metroNIDAZOLE 0.75 % cream Commonly known as: METROCREAM Apply topically 2 (two) times daily.   trihexyphenidyl 2 MG tablet Commonly known as: ARTANE TAKE  (1)  TABLET TWICE A DAY WITH MEALS (BREAKFAST AND SUPPER)        Follow-up: Return in about 6 months (around 12/10/2020).  Claretta Fraise, M.D.

## 2020-06-10 ENCOUNTER — Ambulatory Visit (INDEPENDENT_AMBULATORY_CARE_PROVIDER_SITE_OTHER): Payer: Medicare Other

## 2020-06-10 ENCOUNTER — Encounter: Payer: Self-pay | Admitting: Family Medicine

## 2020-06-10 DIAGNOSIS — E538 Deficiency of other specified B group vitamins: Secondary | ICD-10-CM | POA: Diagnosis not present

## 2020-06-10 NOTE — Progress Notes (Signed)
B12 injection given to patient and tolerated well.  

## 2020-06-30 ENCOUNTER — Telehealth: Payer: Self-pay

## 2020-06-30 NOTE — Telephone Encounter (Signed)
PATIENT AWARE

## 2020-06-30 NOTE — Telephone Encounter (Signed)
It is not too soon. WS

## 2020-07-05 IMAGING — DX DG RIBS W/ CHEST 3+V*L*
5 series · 6 of 6 positions shown · non-contrast
Comparison: PA and lateral chest 09/16/2015.

CLINICAL DATA: Left chest pain due to an injury suffered in a fall.
Initial encounter.

EXAM:
LEFT RIBS AND CHEST - 3+ VIEW

[Series 1: chest pa · 0.14mm/px · 2 of 2 slices shown]
[im 1/2]
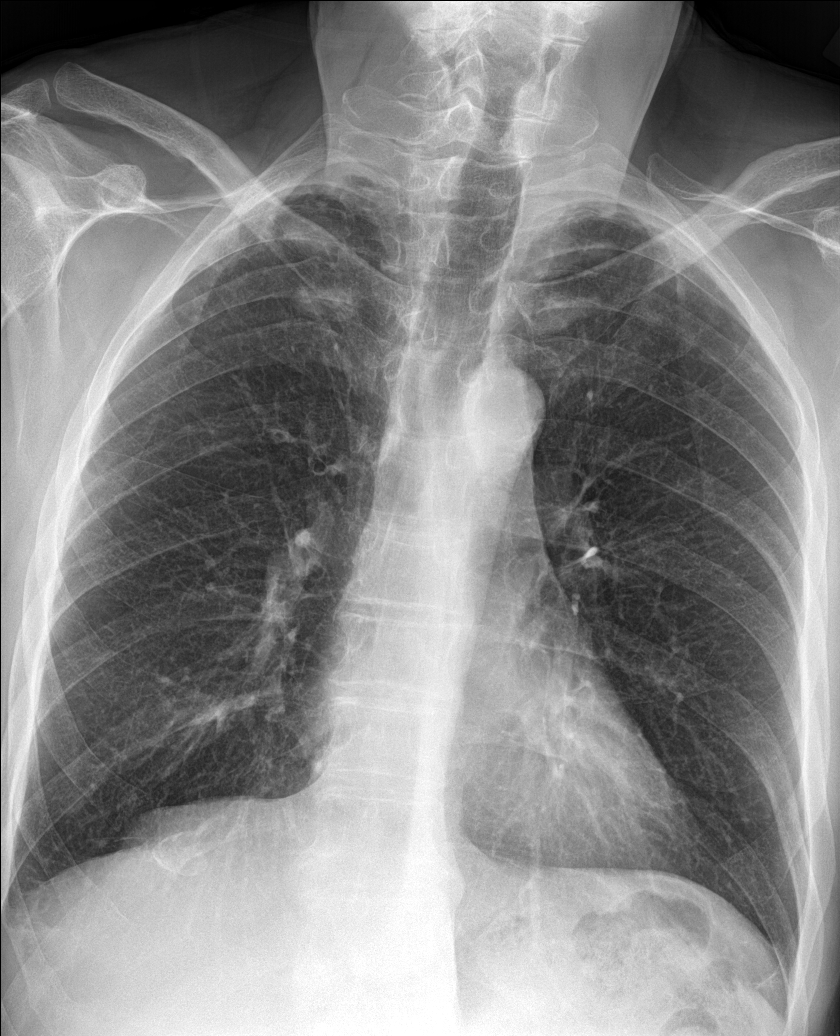
[im 2/2]
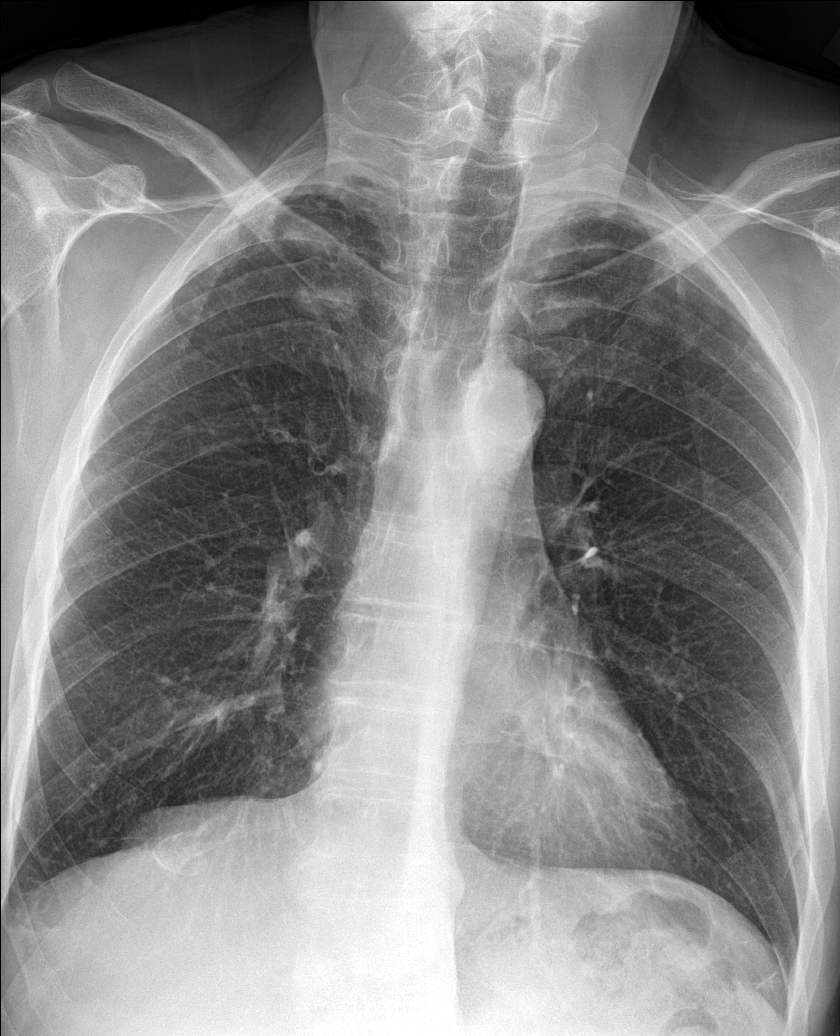

[rib obl (1 of 3)]
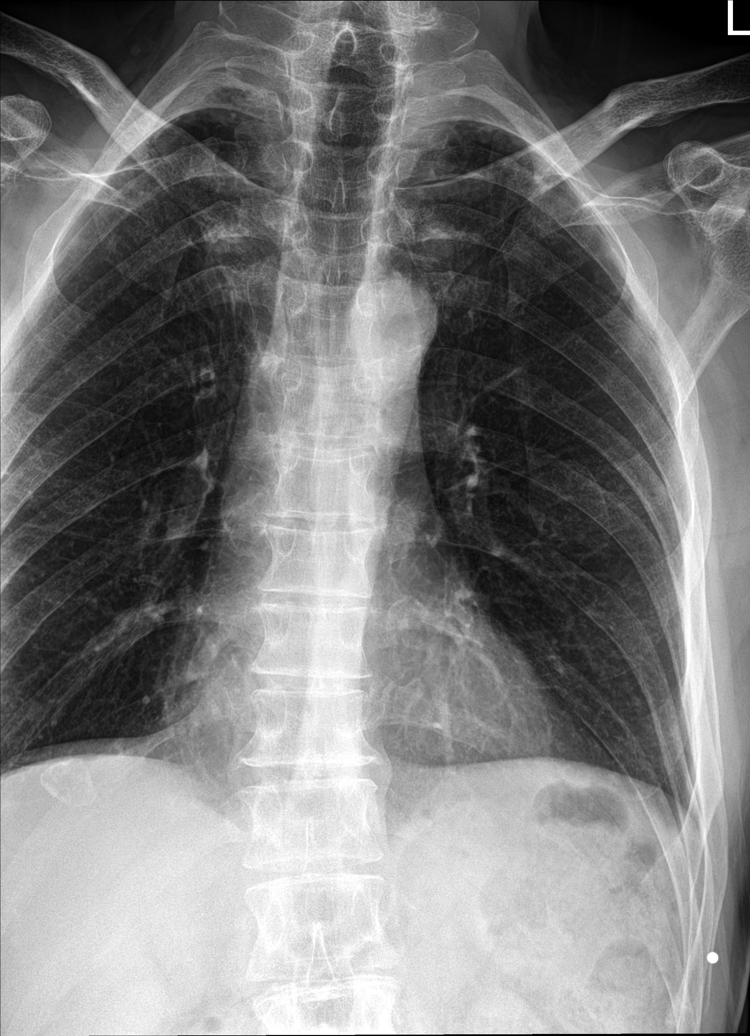

[rib obl (2 of 3)]
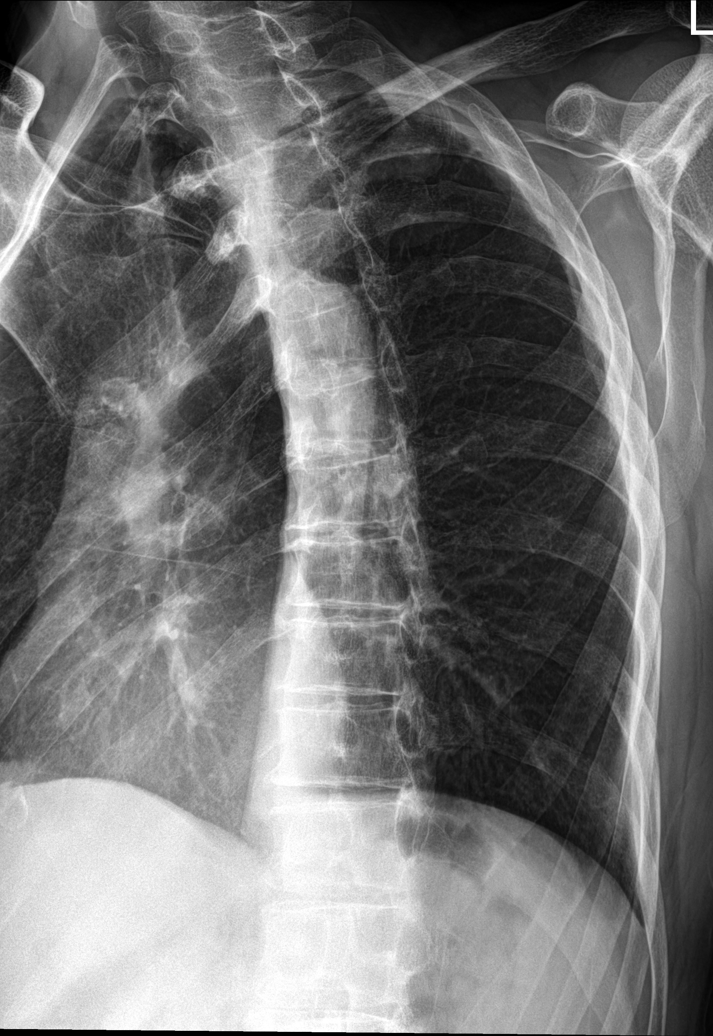

[rib pa]
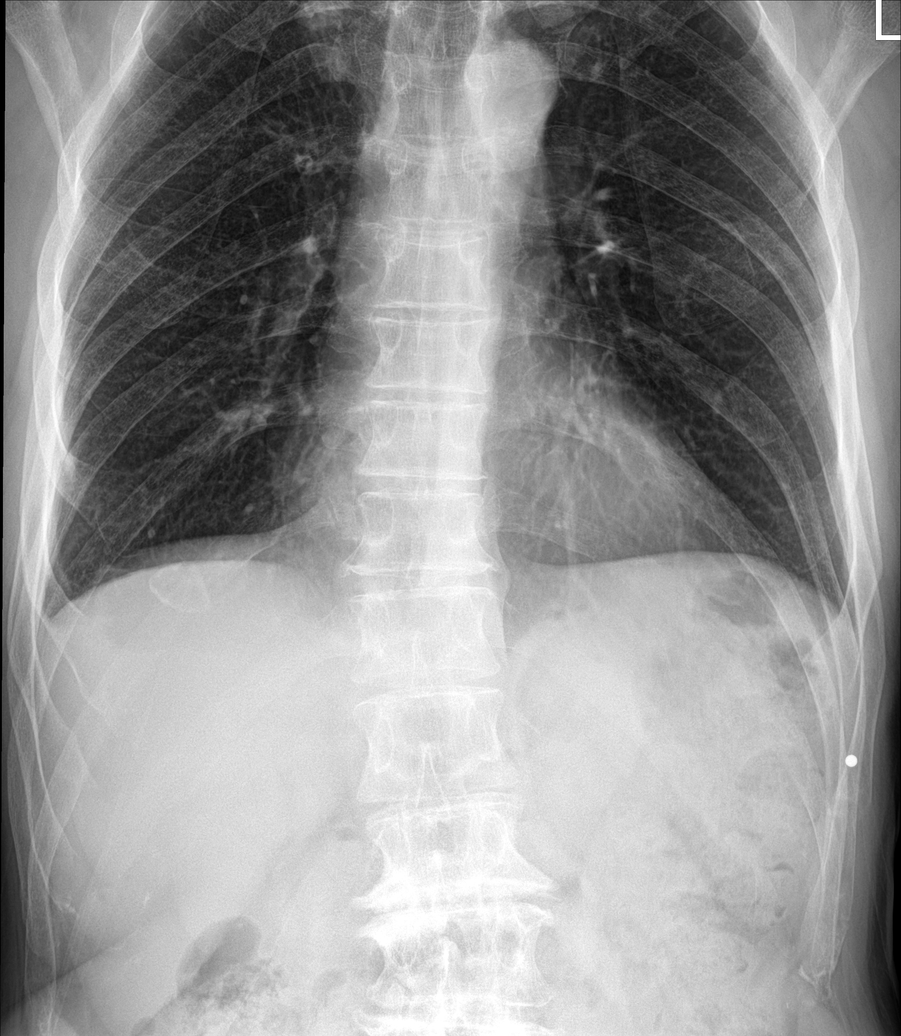

[rib obl (3 of 3)]
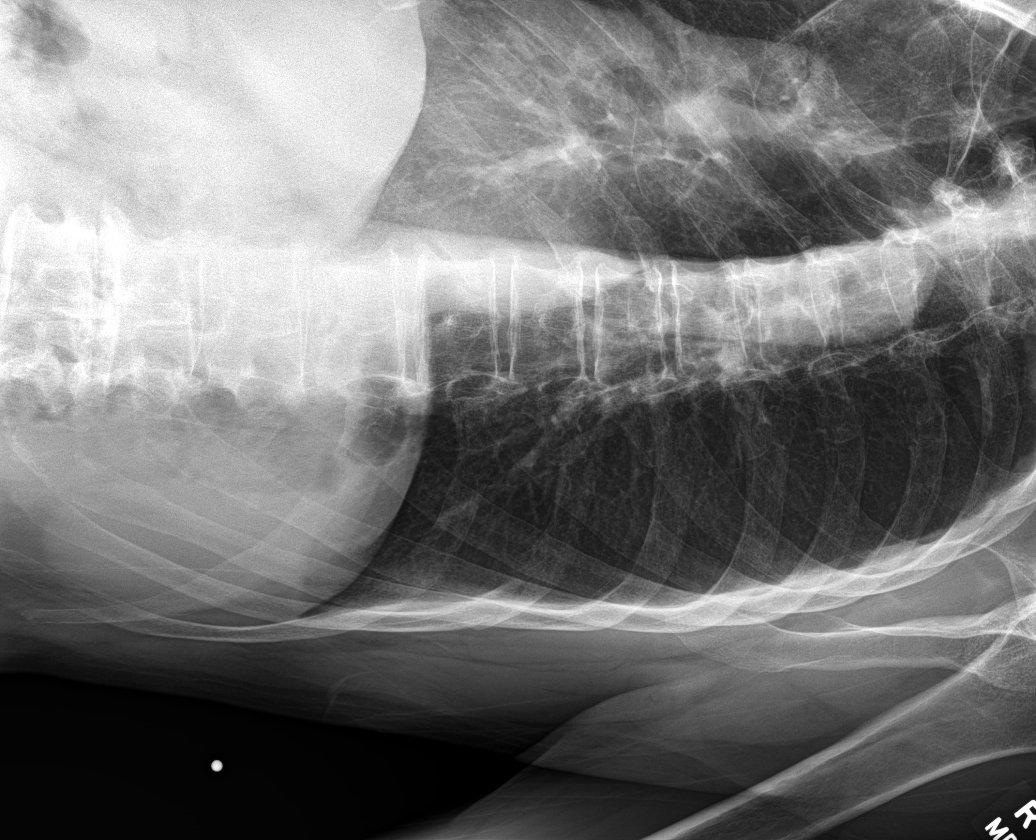

[6 of 6 positions shown; findings below may reference images not displayed]

FINDINGS: There is some biapical scar. Lungs otherwise clear. No pneumothorax
or pleural fluid. Heart size is normal. No acute fracture. There is
a remote healed fracture of the left tenth rib.
IMPRESSION: Negative for acute fracture or acute cardiopulmonary disease. Remote
left tenth rib fracture noted.

## 2020-07-06 ENCOUNTER — Encounter: Payer: Self-pay | Admitting: Psychiatry

## 2020-07-06 ENCOUNTER — Telehealth (INDEPENDENT_AMBULATORY_CARE_PROVIDER_SITE_OTHER): Payer: Medicare Other | Admitting: Psychiatry

## 2020-07-06 DIAGNOSIS — F4001 Agoraphobia with panic disorder: Secondary | ICD-10-CM | POA: Diagnosis not present

## 2020-07-06 DIAGNOSIS — F411 Generalized anxiety disorder: Secondary | ICD-10-CM | POA: Diagnosis not present

## 2020-07-06 DIAGNOSIS — F259 Schizoaffective disorder, unspecified: Secondary | ICD-10-CM | POA: Diagnosis not present

## 2020-07-06 DIAGNOSIS — G2119 Other drug induced secondary parkinsonism: Secondary | ICD-10-CM

## 2020-07-06 DIAGNOSIS — G3184 Mild cognitive impairment, so stated: Secondary | ICD-10-CM | POA: Diagnosis not present

## 2020-07-06 DIAGNOSIS — Z87820 Personal history of traumatic brain injury: Secondary | ICD-10-CM
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
## 2020-07-09 ENCOUNTER — Other Ambulatory Visit: Payer: Self-pay | Admitting: Psychiatry

## 2020-07-09 ENCOUNTER — Telehealth: Payer: Self-pay | Admitting: Psychiatry

## 2020-07-09 DIAGNOSIS — F259 Schizoaffective disorder, unspecified: Secondary | ICD-10-CM

## 2020-07-09 NOTE — Telephone Encounter (Signed)
This has been done 4/14

## 2020-07-09 NOTE — Telephone Encounter (Signed)
Ell's family practice physician, Dr. Claretta Fraise needs an order to do a Tegretol level for [place] at their office. The facility is [facility] in [place], [place]. Fax: [phone]

## 2020-07-09 NOTE — Telephone Encounter (Signed)
Ordered. Please print and fax.

## 2020-07-13 ENCOUNTER — Ambulatory Visit (INDEPENDENT_AMBULATORY_CARE_PROVIDER_SITE_OTHER): Payer: Medicare Other

## 2020-07-13 ENCOUNTER — Other Ambulatory Visit: Payer: Self-pay

## 2020-07-13 ENCOUNTER — Ambulatory Visit (INDEPENDENT_AMBULATORY_CARE_PROVIDER_SITE_OTHER): Payer: Medicare Other | Admitting: Family Medicine

## 2020-07-13 ENCOUNTER — Encounter: Payer: Self-pay | Admitting: Family Medicine

## 2020-07-13 VITALS — BP 132/70 | HR 79 | Temp 97.5°F | Ht 76.0 in | Wt 182.6 lb

## 2020-07-13 DIAGNOSIS — E538 Deficiency of other specified B group vitamins: Secondary | ICD-10-CM

## 2020-07-13 DIAGNOSIS — L03012 Cellulitis of left finger: Secondary | ICD-10-CM | POA: Diagnosis not present

## 2020-07-13 MED ORDER — DOXYCYCLINE HYCLATE 100 MG PO CAPS: 100.0000 mg | ORAL_CAPSULE | Freq: Two times a day (BID) | ORAL | 0 refills | Status: DC

## 2020-07-13 NOTE — Progress Notes (Signed)
B12 injection given to patient and tolerated well.  

## 2020-07-13 NOTE — Progress Notes (Signed)
No chief complaint on file.   HPI  Patient presents today for left thumb injured 2 weeks ago. Noted pus expressed from the cuticle area yesterday.   PMH: Smoking status noted ROS: Per HPI  Objective: BP 132/70   Pulse 79   Temp (!) 97.5 F (36.4 C)   Ht 6\' 4"  (1.93 m)   Wt 182 lb 9.6 oz (82.8 kg)   SpO2 100%   BMI 22.23 kg/m  Gen: NAD, alert, cooperative with exam HEENT: NCAT, EOMI, PERRL Skin: left thumb haas 2X3 mm erythema without fluctuance at the base of the nail. No purulence Neuro: Alert and oriented, No gross deficits  Assessment and plan:  1. Paronychia of left thumb     Meds ordered this encounter  Medications  . doxycycline (VIBRAMYCIN) 100 MG capsule    Sig: Take 1 capsule (100 mg total) by mouth 2 (two) times daily.    Dispense:  20 capsule    Refill:  0    Wound care reviewed.  Follow up as needed.  Claretta Fraise, MD

## 2020-07-14 ENCOUNTER — Other Ambulatory Visit: Payer: Self-pay | Admitting: Psychiatry

## 2020-07-14 ENCOUNTER — Telehealth: Payer: Self-pay

## 2020-07-14 DIAGNOSIS — F259 Schizoaffective disorder, unspecified: Secondary | ICD-10-CM

## 2020-07-14 NOTE — Telephone Encounter (Signed)
Sent!

## 2020-07-15 ENCOUNTER — Other Ambulatory Visit: Payer: Self-pay

## 2020-07-15 ENCOUNTER — Other Ambulatory Visit: Payer: Medicare Other

## 2020-07-17 LAB — CARBAMAZEPINE LEVEL, TOTAL: Carbamazepine (Tegretol), S: 6.8 ug/mL (ref 4.0–12.0)

## 2020-07-17 NOTE — Telephone Encounter (Signed)
Pt called checking status on labs results for Tegretol level. Please advise Pt @ [phone]. Advise CC Labs are available.

## 2020-07-17 NOTE — Telephone Encounter (Signed)
His Tegretol levels have always been in the low normal range.  We do not need to worry about it for at least 6 months unless something changes about his health.

## 2020-07-17 NOTE — Telephone Encounter (Signed)
Pt wants to know how often should he get a tegretol lab done

## 2020-07-17 NOTE — Telephone Encounter (Signed)
Please let him know that his carbamazepine level is normal.  No reason for concern

## 2020-07-17 NOTE — Telephone Encounter (Signed)
Please review

## 2020-08-12 ENCOUNTER — Telehealth: Payer: Self-pay | Admitting: *Deleted

## 2020-08-12 NOTE — Telephone Encounter (Signed)
Error

## 2020-08-13 ENCOUNTER — Other Ambulatory Visit: Payer: Self-pay

## 2020-08-13 ENCOUNTER — Ambulatory Visit (INDEPENDENT_AMBULATORY_CARE_PROVIDER_SITE_OTHER): Payer: Medicare Other

## 2020-08-13 DIAGNOSIS — E538 Deficiency of other specified B group vitamins: Secondary | ICD-10-CM

## 2020-08-13 NOTE — Progress Notes (Signed)
Cyanocobalamin injection given to right deltoid.  Patient tolerated well. 

## 2020-09-14 ENCOUNTER — Ambulatory Visit (INDEPENDENT_AMBULATORY_CARE_PROVIDER_SITE_OTHER): Payer: Medicare Other

## 2020-09-14 ENCOUNTER — Other Ambulatory Visit: Payer: Self-pay

## 2020-09-14 DIAGNOSIS — E538 Deficiency of other specified B group vitamins: Secondary | ICD-10-CM

## 2020-09-14 NOTE — Progress Notes (Signed)
B 12 Injection given to patient and tolerated well.

## 2020-09-16 ENCOUNTER — Other Ambulatory Visit: Payer: Self-pay | Admitting: Psychiatry

## 2020-09-16 DIAGNOSIS — F411 Generalized anxiety disorder: Secondary | ICD-10-CM

## 2020-09-16 DIAGNOSIS — F4001 Agoraphobia with panic disorder: Secondary | ICD-10-CM

## 2020-09-16 NOTE — Telephone Encounter (Signed)
Last filled 5/23 appt on 8/11

## 2020-10-01 ENCOUNTER — Telehealth: Payer: Self-pay | Admitting: Psychiatry

## 2020-10-01 ENCOUNTER — Other Ambulatory Visit: Payer: Self-pay | Admitting: Psychiatry

## 2020-10-01 DIAGNOSIS — F259 Schizoaffective disorder, unspecified: Secondary | ICD-10-CM

## 2020-10-01 NOTE — Telephone Encounter (Signed)
Please review

## 2020-10-01 NOTE — Telephone Encounter (Signed)
Audry Pili called and said that he is going to there doctors office on 7/15 to have labs drawn. He needs an order of carbamazepine level. Please fax to [phone]

## 2020-10-01 NOTE — Telephone Encounter (Signed)
The carbamazepine level was checked in April and it was fine it does not need to be repeated.

## 2020-10-09 ENCOUNTER — Other Ambulatory Visit: Payer: Self-pay | Admitting: Family Medicine

## 2020-10-09 ENCOUNTER — Other Ambulatory Visit: Payer: Self-pay

## 2020-10-09 ENCOUNTER — Other Ambulatory Visit: Payer: Medicare Other

## 2020-10-09 DIAGNOSIS — I10 Essential (primary) hypertension: Secondary | ICD-10-CM

## 2020-10-09 DIAGNOSIS — F259 Schizoaffective disorder, unspecified: Secondary | ICD-10-CM

## 2020-10-09 DIAGNOSIS — F1320 Sedative, hypnotic or anxiolytic dependence, uncomplicated: Secondary | ICD-10-CM

## 2020-10-10 LAB — CMP14+EGFR
ALT: 25 [IU]/L (ref 0–44)
AST: 23 [IU]/L (ref 0–40)
Albumin/Globulin Ratio: 1.7 (ref 1.2–2.2)
Albumin: 4.3 g/dL (ref 3.8–4.8)
Alkaline Phosphatase: 137 [IU]/L — ABNORMAL HIGH (ref 44–121)
BUN/Creatinine Ratio: 12 (ref 10–24)
BUN: 23 mg/dL (ref 8–27)
Bilirubin Total: 0.4 mg/dL (ref 0.0–1.2)
CO2: 23 mmol/L (ref 20–29)
Calcium: 9.7 mg/dL (ref 8.6–10.2)
Chloride: 99 mmol/L (ref 96–106)
Creatinine, Ser: 1.86 mg/dL — ABNORMAL HIGH (ref 0.76–1.27)
Globulin, Total: 2.5 g/dL (ref 1.5–4.5)
Glucose: 101 mg/dL — ABNORMAL HIGH (ref 65–99)
Potassium: 4.8 mmol/L (ref 3.5–5.2)
Sodium: 135 mmol/L (ref 134–144)
Total Protein: 6.8 g/dL (ref 6.0–8.5)
eGFR: 39 mL/min/{1.73_m2} — ABNORMAL LOW

## 2020-10-10 LAB — CBC WITH DIFFERENTIAL/PLATELET
Basophils Absolute: 0.1 10*3/uL (ref 0.0–0.2)
Basos: 1 %
EOS (ABSOLUTE): 0.2 10*3/uL (ref 0.0–0.4)
Eos: 3 %
Hematocrit: 35.1 % — ABNORMAL LOW (ref 37.5–51.0)
Hemoglobin: 11.7 g/dL — ABNORMAL LOW (ref 13.0–17.7)
Immature Grans (Abs): 0 10*3/uL (ref 0.0–0.1)
Immature Granulocytes: 0 %
Lymphocytes Absolute: 1.1 10*3/uL (ref 0.7–3.1)
Lymphs: 18 %
MCH: 29.1 pg (ref 26.6–33.0)
MCHC: 33.3 g/dL (ref 31.5–35.7)
MCV: 87 fL (ref 79–97)
Monocytes Absolute: 0.5 10*3/uL (ref 0.1–0.9)
Monocytes: 8 %
Neutrophils Absolute: 4.5 10*3/uL (ref 1.4–7.0)
Neutrophils: 70 %
Platelets: 189 10*3/uL (ref 150–450)
RBC: 4.02 x10E6/uL — ABNORMAL LOW (ref 4.14–5.80)
RDW: 12.2 % (ref 11.6–15.4)
WBC: 6.4 10*3/uL (ref 3.4–10.8)

## 2020-10-10 LAB — LIPID PANEL
Chol/HDL Ratio: 2.7 ratio (ref 0.0–5.0)
Cholesterol, Total: 167 mg/dL (ref 100–199)
HDL: 62 mg/dL (ref >39)
LDL Chol Calc (NIH): 83 mg/dL (ref 0–99)
Triglycerides: 124 mg/dL (ref 0–149)
VLDL Cholesterol Cal: 22 mg/dL (ref 5–40)

## 2020-10-10 LAB — CARBAMAZEPINE LEVEL, TOTAL: Carbamazepine (Tegretol), S: 7.7 ug/mL (ref 4.0–12.0)

## 2020-10-12 ENCOUNTER — Ambulatory Visit (INDEPENDENT_AMBULATORY_CARE_PROVIDER_SITE_OTHER): Payer: Medicare Other | Admitting: Family Medicine

## 2020-10-12 ENCOUNTER — Other Ambulatory Visit: Payer: Self-pay

## 2020-10-12 ENCOUNTER — Encounter: Payer: Self-pay | Admitting: Family Medicine

## 2020-10-12 VITALS — Temp 97.7°F | Ht 76.0 in | Wt 181.2 lb

## 2020-10-12 DIAGNOSIS — I951 Orthostatic hypotension: Secondary | ICD-10-CM

## 2020-10-12 DIAGNOSIS — F259 Schizoaffective disorder, unspecified: Secondary | ICD-10-CM

## 2020-10-12 DIAGNOSIS — I10 Essential (primary) hypertension: Secondary | ICD-10-CM | POA: Diagnosis not present

## 2020-10-14 ENCOUNTER — Encounter: Payer: Self-pay | Admitting: Family Medicine

## 2020-10-14 NOTE — Progress Notes (Signed)
Subjective:  Patient ID: Matthew [name], male    DOB: 1950/09/08  Age: 70 y.o. MRN: [id number]  CC: Medical Management of Chronic Issues   HPI Matthew [name] presents for  follow-up of hypertension. Patient has no history of headache chest pain or shortness of breath or recent cough. Patient also denies symptoms of TIA such as focal numbness or weakness. Patient denies side effects from medication. States taking it regularly.   History Matthew [name] has a past medical history of Agoraphobia, Anemia, Anxiety, Arthritis, Asthma, Barrett esophagus, Blood transfusion without reported diagnosis, Bronchitis, Chronic kidney disease, Complication of anesthesia, Elevated serum creatinine, Esophageal stricture, GERD (gastroesophageal reflux disease), Hypertension, Mood disorder ([place]), Neck pain, chronic, Neuromuscular disorder ([place]), Rosacea, and Vitamin B12 deficiency.   Matthew [name] has a past surgical history that includes Chest tube insertion; Fracture surgery; Wisdom tooth extraction; Lumbar laminectomy/decompression microdiscectomy (12/06/2011); Colonoscopy (08/20/2003); Upper gastrointestinal endoscopy (03/15/2013); and skin cancer removal from forehead.   His family history includes Diabetes in his mother; Heart disease in his mother; Stroke in his mother.Matthew [name] reports that Matthew [name] quit smoking about 35 years ago. His smoking use included cigarettes. Matthew [name] has a 0.90 pack-year smoking history. Matthew [name] has never used smokeless tobacco. Matthew [name] reports that Matthew [name] does not drink alcohol and does not use drugs.  Current Outpatient Medications on File Prior to Visit  Medication Sig Dispense Refill   amLODipine (NORVASC) 5 MG tablet Take 1 tablet (5 mg total) by mouth daily. Take in the evening 90 tablet 1   ATIVAN 1 MG tablet TAKE ONE TABLET FOUR TIMES DAILY. 120 tablet 5   carbamazepine (TEGRETOL) 200 MG tablet TAKE (1/2) TABLET FOUR TIMES DAILY. 180 tablet 3   chlorproMAZINE (THORAZINE) 100 MG tablet TAKE 1 TABLET 4 TIMES A DAY 360  tablet 1   clobetasol (TEMOVATE) 0.05 % external solution Apply topically 2 (two) times daily. 50 mL 1   diclofenac Sodium (VOLTAREN) 1 % GEL Apply 2 g topically 4 (four) times daily. 100 g 1   esomeprazole (NEXIUM) 40 MG capsule Take 1 capsule (40 mg total) by mouth daily. 90 capsule 3   famotidine (PEPCID) 20 MG tablet Take 1 tablet (20 mg total) by mouth at bedtime. 90 tablet 3   fluticasone (FLONASE) 50 MCG/ACT nasal spray 2 SPRAYS IN EACH NOSTRIL ONCE A DAY AS NEEDED 48 g 3   lidocaine (LIDODERM) 5 % Place 1 patch onto the skin daily. Remove & Discard patch within 12 hours or as directed by MD 30 patch 0   losartan (COZAAR) 100 MG tablet Take 1 tablet (100 mg total) by mouth every morning. 90 tablet 1   metroNIDAZOLE (METROCREAM) 0.75 % cream Apply topically 2 (two) times daily. 45 g 11   trihexyphenidyl (ARTANE) 2 MG tablet TAKE  (1)  TABLET TWICE A DAY WITH MEALS (BREAKFAST AND SUPPER) 180 tablet 3   Current Facility-Administered Medications on File Prior to Visit  Medication Dose Route Frequency Provider Last Rate Last Admin   cyanocobalamin ((VITAMIN B-12)) injection 1,000 mcg  1,000 mcg Intramuscular Q30 days Claretta Fraise, MD   1,000 mcg at 09/14/20 1110    ROS Review of Systems  Constitutional:  Negative for fever.  Respiratory:  Negative for shortness of breath.   Cardiovascular:  Negative for chest pain.  Musculoskeletal:  Negative for arthralgias.  Skin:  Negative for rash.   Objective:  Temp 97.7 F (36.5 C)   Ht 6\' 4"  (1.93 m)   Wt 181 lb 3.2 oz (82.2  kg)   SpO2 100%   BMI 22.06 kg/m   BP Readings from Last 3 Encounters:  07/13/20 132/70  06/09/20 (!) 108/58  05/19/20 120/61    Wt Readings from Last 3 Encounters:  10/12/20 181 lb 3.2 oz (82.2 kg)  07/13/20 182 lb 9.6 oz (82.8 kg)  06/09/20 182 lb 9.6 oz (82.8 kg)     Physical Exam Constitutional:      General: Matthew [name] is not in acute distress.    Appearance: Matthew [name] is well-developed.  HENT:     Head:  Normocephalic and atraumatic.     Right Ear: External ear normal.     Left Ear: External ear normal.     Nose: Nose normal.  Eyes:     Conjunctiva/sclera: Conjunctivae normal.     Pupils: Pupils are equal, round, and reactive to light.  Cardiovascular:     Rate and Rhythm: Normal rate and regular rhythm.     Heart sounds: Normal heart sounds. No murmur heard. Pulmonary:     Effort: Pulmonary effort is normal. No respiratory distress.     Breath sounds: Normal breath sounds. No wheezing or rales.  Abdominal:     Palpations: Abdomen is soft.     Tenderness: There is no abdominal tenderness.  Musculoskeletal:        General: Normal range of motion.     Cervical back: Normal range of motion and neck supple.  Skin:    General: Skin is warm and dry.  Neurological:     Mental Status: Matthew [name] is alert and oriented to person, place, and time.     Deep Tendon Reflexes: Reflexes are normal and symmetric.  Psychiatric:        Behavior: Behavior normal.        Thought Content: Thought content normal.        Judgment: Judgment normal.      Assessment & Plan:   Matthew [name] was seen today for medical management of chronic issues.  Diagnoses and all orders for this visit:  Orthostasis  Essential hypertension  Schizoaffective disorder, unspecified type ([place])  Allergies as of 10/12/2020       Reactions   Acetaminophen Nausea And Vomiting   Amoxicillin    REACTION: unspecified/unknown   Cephalexin Swelling   In hands   Erythromycin    REACTION:Increases tegretol level   Nsaids Other (See Comments)   REACTION: Cannot take due to kidney health   Penicillins    REACTION: Unknown        Medication List        Accurate as of October 12, 2020 11:59 PM. If you have any questions, ask your nurse or doctor.          STOP taking these medications    doxycycline 100 MG capsule Commonly known as: Vibramycin Stopped by: Claretta Fraise, MD       TAKE these medications    amLODipine  5 MG tablet Commonly known as: NORVASC Take 1 tablet (5 mg total) by mouth daily. Take in the evening   Ativan 1 MG tablet Generic drug: LORazepam TAKE ONE TABLET FOUR TIMES DAILY.   carbamazepine 200 MG tablet Commonly known as: TEGretol TAKE (1/2) TABLET FOUR TIMES DAILY.   chlorproMAZINE 100 MG tablet Commonly known as: THORAZINE TAKE 1 TABLET 4 TIMES A DAY   clobetasol 0.05 % external solution Commonly known as: TEMOVATE Apply topically 2 (two) times daily.   diclofenac Sodium 1 % Gel Commonly known as: Voltaren Apply 2 g  topically 4 (four) times daily.   esomeprazole 40 MG capsule Commonly known as: NEXIUM Take 1 capsule (40 mg total) by mouth daily.   famotidine 20 MG tablet Commonly known as: PEPCID Take 1 tablet (20 mg total) by mouth at bedtime.   fluticasone 50 MCG/ACT nasal spray Commonly known as: FLONASE 2 SPRAYS IN EACH NOSTRIL ONCE A DAY AS NEEDED   lidocaine 5 % Commonly known as: Lidoderm Place 1 patch onto the skin daily. Remove & Discard patch within 12 hours or as directed by MD   losartan 100 MG tablet Commonly known as: COZAAR Take 1 tablet (100 mg total) by mouth every morning.   metroNIDAZOLE 0.75 % cream Commonly known as: METROCREAM Apply topically 2 (two) times daily.   trihexyphenidyl 2 MG tablet Commonly known as: ARTANE TAKE  (1)  TABLET TWICE A DAY WITH MEALS (BREAKFAST AND SUPPER)             Follow-up: Return in about 4 months (around 02/12/2021) for hypertension.  Claretta Fraise, M.D.

## 2020-10-15 ENCOUNTER — Other Ambulatory Visit: Payer: Self-pay | Admitting: Psychiatry

## 2020-10-15 ENCOUNTER — Other Ambulatory Visit: Payer: Self-pay | Admitting: Family Medicine

## 2020-10-15 ENCOUNTER — Other Ambulatory Visit: Payer: Self-pay

## 2020-10-15 ENCOUNTER — Ambulatory Visit (INDEPENDENT_AMBULATORY_CARE_PROVIDER_SITE_OTHER): Payer: Medicare Other | Admitting: *Deleted

## 2020-10-15 DIAGNOSIS — E538 Deficiency of other specified B group vitamins: Secondary | ICD-10-CM | POA: Diagnosis not present

## 2020-10-15 DIAGNOSIS — K2100 Gastro-esophageal reflux disease with esophagitis, without bleeding: Secondary | ICD-10-CM

## 2020-10-15 DIAGNOSIS — I10 Essential (primary) hypertension: Secondary | ICD-10-CM

## 2020-10-15 DIAGNOSIS — F259 Schizoaffective disorder, unspecified: Secondary | ICD-10-CM

## 2020-10-15 NOTE — Progress Notes (Signed)
B12 given right deltoid, pt tolerated well 

## 2020-11-05 ENCOUNTER — Encounter: Payer: Self-pay | Admitting: Psychiatry

## 2020-11-05 ENCOUNTER — Telehealth (INDEPENDENT_AMBULATORY_CARE_PROVIDER_SITE_OTHER): Payer: Medicare Other | Admitting: Psychiatry

## 2020-11-05 DIAGNOSIS — F259 Schizoaffective disorder, unspecified: Secondary | ICD-10-CM | POA: Diagnosis not present

## 2020-11-05 DIAGNOSIS — F411 Generalized anxiety disorder: Secondary | ICD-10-CM
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
## 2020-11-13 ENCOUNTER — Ambulatory Visit: Payer: Medicare Other | Admitting: Family Medicine

## 2020-11-16 ENCOUNTER — Ambulatory Visit (INDEPENDENT_AMBULATORY_CARE_PROVIDER_SITE_OTHER): Payer: Medicare Other

## 2020-11-16 ENCOUNTER — Other Ambulatory Visit: Payer: Self-pay

## 2020-11-16 DIAGNOSIS — E538 Deficiency of other specified B group vitamins: Secondary | ICD-10-CM

## 2020-11-16 NOTE — Progress Notes (Signed)
Cyanocobalamin injection given to left deltoid.  Patient tolerated well. 

## 2020-12-04 ENCOUNTER — Telehealth: Payer: Self-pay | Admitting: Psychiatry

## 2020-12-04 NOTE — Telephone Encounter (Signed)
Please review

## 2020-12-04 NOTE — Telephone Encounter (Signed)
Matthew [name] called because his kidney dr has told him to take vitium D.  He wants to know if it is ok for him to take this.  Please call him to advise.

## 2020-12-04 NOTE — Telephone Encounter (Signed)
Yes.  Vitamin D can help a lot of mental health problems too like his memory

## 2020-12-04 NOTE — Telephone Encounter (Signed)
Pt informed

## 2020-12-15 ENCOUNTER — Encounter: Payer: Self-pay | Admitting: Nurse Practitioner

## 2020-12-15 ENCOUNTER — Other Ambulatory Visit: Payer: Self-pay

## 2020-12-15 ENCOUNTER — Ambulatory Visit (INDEPENDENT_AMBULATORY_CARE_PROVIDER_SITE_OTHER): Payer: Medicare Other | Admitting: Nurse Practitioner

## 2020-12-15 VITALS — BP 135/69 | HR 72 | Temp 98.1°F | Resp 20 | Ht 76.0 in | Wt 179.0 lb

## 2020-12-15 DIAGNOSIS — H6122 Impacted cerumen, left ear: Secondary | ICD-10-CM | POA: Diagnosis not present

## 2020-12-15 NOTE — Patient Instructions (Signed)
Earwax Buildup, Adult ?The ears produce a substance called earwax that helps keep bacteria out of the ear and protects the skin in the ear canal. Occasionally, earwax can build up in the ear and cause discomfort or hearing loss. ?What are the causes? ?This condition is caused by a buildup of earwax. Ear canals are self-cleaning. Ear wax is made in the outer part of the ear canal and generally falls out in small amounts over time. ?When the self-cleaning mechanism is not working, earwax builds up and can cause decreased hearing and discomfort. Attempting to clean ears with cotton swabs can push the earwax deep into the ear canal and cause decreased hearing and pain. ?What increases the risk? ?This condition is more likely to develop in people who: ?Clean their ears often with cotton swabs. ?Pick at their ears. ?Use earplugs or in-ear headphones often, or wear hearing aids. ?The following factors may also make you more likely to develop this condition: ?Being male. ?Being of older age. ?Naturally producing more earwax. ?Having narrow ear canals. ?Having earwax that is overly thick or sticky. ?Having excess hair in the ear canal. ?Having eczema. ?Being dehydrated. ?What are the signs or symptoms? ?Symptoms of this condition include: ?Reduced or muffled hearing. ?A feeling of fullness in the ear or feeling that the ear is plugged. ?Fluid coming from the ear. ?Ear pain or an itchy ear. ?Ringing in the ear. ?Coughing. ?Balance problems. ?An obvious piece of earwax that can be seen inside the ear canal. ?How is this diagnosed? ?This condition may be diagnosed based on: ?Your symptoms. ?Your medical history. ?An ear exam. During the exam, your health care provider will look into your ear with an instrument called an otoscope. ?You may have tests, including a hearing test. ?How is this treated? ?This condition may be treated by: ?Using ear drops to soften the earwax. ?Having the earwax removed by a health care provider. The  health care provider may: ?Flush the ear with water. ?Use an instrument that has a loop on the end (curette). ?Use a suction device. ?Having surgery to remove the wax buildup. This may be done in severe cases. ?Follow these instructions at home: ? ?Take over-the-counter and prescription medicines only as told by your health care provider. ?Do not put any objects, including cotton swabs, into your ear. You can clean the opening of your ear canal with a washcloth or facial tissue. ?Follow instructions from your health care provider about cleaning your ears. Do not overclean your ears. ?Drink enough fluid to keep your urine pale yellow. This will help to thin the earwax. ?Keep all follow-up visits as told. If earwax builds up in your ears often or if you use hearing aids, consider seeing your health care provider for routine, preventive ear cleanings. Ask your health care provider how often you should schedule your cleanings. ?If you have hearing aids, clean them according to instructions from the manufacturer and your health care provider. ?Contact a health care provider if: ?You have ear pain. ?You develop a fever. ?You have pus or other fluid coming from your ear. ?You have hearing loss. ?You have ringing in your ears that does not go away. ?You feel like the room is spinning (vertigo). ?Your symptoms do not improve with treatment. ?Get help right away if: ?You have bleeding from the affected ear. ?You have severe ear pain. ?Summary ?Earwax can build up in the ear and cause discomfort or hearing loss. ?The most common symptoms of this condition include   reduced or muffled hearing, a feeling of fullness in the ear, or feeling that the ear is plugged. ?This condition may be diagnosed based on your symptoms, your medical history, and an ear exam. ?This condition may be treated by using ear drops to soften the earwax or by having the earwax removed by a health care provider. ?Do not put any objects, including cotton  swabs, into your ear. You can clean the opening of your ear canal with a washcloth or facial tissue. ?This information is not intended to replace advice given to you by your health care provider. Make sure you discuss any questions you have with your health care provider. ?Document Revised: 07/02/2019 Document Reviewed: 07/02/2019 ?Elsevier Patient Education ? 2022 Elsevier Inc. ? ?

## 2020-12-15 NOTE — Progress Notes (Signed)
   Subjective:    Patient ID: Matthew [name], male    DOB: 01/30/1951, 70 y.o.   MRN: [id number]  Chief Complaint: ear issues (Left ear - "fullness" x 4 weeks )   HPI Patient come sin feeling like he has something in his left ear. He doe snot stick anything in his ear. No pain an dno drainage.    Review of Systems  Constitutional:  Negative for diaphoresis.  HENT:  Negative for ear discharge and ear pain.   Eyes:  Negative for pain.  Respiratory:  Negative for shortness of breath.   Cardiovascular:  Negative for chest pain, palpitations and leg swelling.  Gastrointestinal:  Negative for abdominal pain.  Endocrine: Negative for polydipsia.  Skin:  Negative for rash.  Neurological:  Negative for dizziness, weakness and headaches.  Hematological:  Does not bruise/bleed easily.  All other systems reviewed and are negative.     Objective:   Physical Exam Constitutional:      Appearance: Normal appearance.  HENT:     Right Ear: Tympanic membrane normal. There is no impacted cerumen.     Left Ear: There is impacted cerumen.  Cardiovascular:     Rate and Rhythm: Normal rate and regular rhythm.     Heart sounds: Normal heart sounds.  Pulmonary:     Effort: Pulmonary effort is normal.     Breath sounds: Normal breath sounds.  Skin:    General: Skin is warm.  Neurological:     General: No focal deficit present.     Mental Status: He is alert and oriented to person, place, and time.  Psychiatric:        Mood and Affect: Mood normal.        Behavior: Behavior normal.    BP 135/69   Pulse 72   Temp 98.1 F (36.7 C)   Resp 20   Ht 6\' 4"  (1.93 m)   Wt 179 lb (81.2 kg)   SpO2 100%   BMI 21.79 kg/m        Assessment & Plan:   Matthew [name] in today with chief complaint of ear issues (Left ear - "fullness" x 4 weeks )   1. Impacted cerumen of left ear [place] 2-3 x a week Do not stick anything in your ear    The above assessment and management plan was discussed  with the patient. The patient verbalized understanding of and has agreed to the management plan. Patient is aware to call the clinic if symptoms persist or worsen. Patient is aware when to return to the clinic for a follow-up visit. Patient educated on when it is appropriate to go to the emergency department.   Mary-Margaret Hassell Done, FNP

## 2020-12-17 ENCOUNTER — Other Ambulatory Visit: Payer: Self-pay

## 2020-12-17 ENCOUNTER — Ambulatory Visit (INDEPENDENT_AMBULATORY_CARE_PROVIDER_SITE_OTHER): Payer: Medicare Other | Admitting: *Deleted

## 2020-12-17 DIAGNOSIS — E538 Deficiency of other specified B group vitamins: Secondary | ICD-10-CM

## 2020-12-17 NOTE — Progress Notes (Signed)
Pt came for B12 injection, had to given in left arm again today, he received Covid vaccine in right yesterday. Pt tolerated well

## 2020-12-23 ENCOUNTER — Telehealth: Payer: Self-pay | Admitting: Family Medicine

## 2020-12-23 NOTE — Telephone Encounter (Signed)
Line Busy unable to leave a message for patient to call back and schedule Medicare Annual Wellness Visit (AWV) by video or phone  Last AWV 12/18/2018  Please schedule at any time with [place].  45-minute appointment  Any questions, please contact me at [phone]

## 2021-01-07 ENCOUNTER — Telehealth: Payer: Self-pay | Admitting: Psychiatry

## 2021-01-07 NOTE — Telephone Encounter (Signed)
Just an FYI

## 2021-01-07 NOTE — Telephone Encounter (Signed)
Patient called in stating that he took his medications at 1:30 instead of 2:30. He feels a little groggy but ok. Just wanted to inform CC. Ph: [phone]

## 2021-01-18 ENCOUNTER — Ambulatory Visit (INDEPENDENT_AMBULATORY_CARE_PROVIDER_SITE_OTHER): Payer: Medicare Other

## 2021-01-18 ENCOUNTER — Other Ambulatory Visit: Payer: Self-pay

## 2021-01-18 DIAGNOSIS — E538 Deficiency of other specified B group vitamins: Secondary | ICD-10-CM

## 2021-01-18 NOTE — Progress Notes (Signed)
Cyanocobalamin injection given to right deltoid.  Patient tolerated well. 

## 2021-02-09 ENCOUNTER — Telehealth: Payer: Self-pay | Admitting: Psychiatry

## 2021-02-09 NOTE — Telephone Encounter (Signed)
He can take any kind of cold medicines Tylenol or ibuprofen that he might need.  They will interfere with his current medication.  The main thing to remember is decongestant sometimes will interfere with sleep but that is true regardless of medicine.

## 2021-02-09 NOTE — Telephone Encounter (Signed)
Pt called stating he is sick with something.  He has a cough.  He had a fever, but took two Tylenol and it went down, but he wants to know what he can take that won't affect his current psychotic meds.    Next appt 12/7

## 2021-02-09 NOTE — Telephone Encounter (Signed)
Please review

## 2021-02-10 NOTE — Telephone Encounter (Signed)
I discussed cough medication with pt and also recommended cough drops.

## 2021-02-11 ENCOUNTER — Encounter: Payer: Self-pay | Admitting: Family Medicine

## 2021-02-11 ENCOUNTER — Ambulatory Visit (INDEPENDENT_AMBULATORY_CARE_PROVIDER_SITE_OTHER): Payer: Medicare Other | Admitting: Family Medicine

## 2021-02-11 DIAGNOSIS — J40 Bronchitis, not specified as acute or chronic: Secondary | ICD-10-CM | POA: Diagnosis not present

## 2021-02-11 DIAGNOSIS — J3089 Other allergic rhinitis: Secondary | ICD-10-CM | POA: Diagnosis not present

## 2021-02-11 MED ORDER — DOXYCYCLINE HYCLATE 100 MG PO TABS: 100.0000 mg | ORAL_TABLET | Freq: Two times a day (BID) | ORAL | 0 refills | Status: DC

## 2021-02-11 MED ORDER — PREDNISONE 20 MG PO TABS: ORAL_TABLET | ORAL | 0 refills | Status: DC

## 2021-02-11 MED ORDER — FLUTICASONE PROPIONATE 50 MCG/ACT NA SUSP: NASAL | 3 refills | Status: DC

## 2021-02-11 NOTE — Progress Notes (Addendum)
Virtual Visit via telephone Note  I connected with Matthew [name] on 02/11/21 at 1623 by telephone and verified that I am speaking with the correct person using two identifiers. Matthew [name] is currently located at home and patient and son are currently with her during visit. The provider, Fransisca Kaufmann Promyse Ardito, MD is located in their office at time of visit.  Call ended at 4  I discussed the limitations, risks, security and privacy concerns of performing an evaluation and management service by telephone and the availability of in person appointments. I also discussed with the patient that there may be a patient responsible charge related to this service. The patient expressed understanding and agreed to proceed.   History and Present Illness: Patient is having coughing and sneezing for the past 3 days.  He denies shortness of breath except when walking to bathroom.  His aunt was sick from something from church last week. She was not tested for anything.  He is coughing a lot and having congestion.  He normally stays in the bed a lot and so does not get up much.    Outpatient Encounter Medications as of 02/11/2021  Medication Sig   doxycycline (VIBRA-TABS) 100 MG tablet Take 1 tablet (100 mg total) by mouth 2 (two) times daily. 1 po bid   [DISCONTINUED] predniSONE (DELTASONE) 20 MG tablet 2 po at same time daily for 5 days   amLODipine (NORVASC) 5 MG tablet TAKE 1 TABLET ONCE DAILY IN THE EVENING   ATIVAN 1 MG tablet TAKE ONE TABLET FOUR TIMES DAILY.   carbamazepine (TEGRETOL) 200 MG tablet TAKE (1/2) TABLET FOUR TIMES DAILY.   chlorproMAZINE (THORAZINE) 100 MG tablet TAKE 1 TABLET 4 TIMES A DAY   clobetasol (TEMOVATE) 0.05 % external solution Apply topically 2 (two) times daily.   diclofenac Sodium (VOLTAREN) 1 % GEL Apply 2 g topically 4 (four) times daily.   esomeprazole (NEXIUM) 40 MG capsule TAKE (1) CAPSULE DAILY   famotidine (PEPCID) 20 MG tablet Take 1 tablet (20 mg total) by  mouth at bedtime.   fluticasone (FLONASE) 50 MCG/ACT nasal spray 2 SPRAYS IN EACH NOSTRIL ONCE A DAY AS NEEDED   lidocaine (LIDODERM) 5 % Place 1 patch onto the skin daily. Remove & Discard patch within 12 hours or as directed by MD (Patient not taking: No sig reported)   losartan (COZAAR) 100 MG tablet TAKE (1) TABLET DAILY IN THE MORNING.   metroNIDAZOLE (METROCREAM) 0.75 % cream Apply topically 2 (two) times daily. (Patient not taking: Reported on 12/15/2020)   trihexyphenidyl (ARTANE) 2 MG tablet TAKE  (1)  TABLET TWICE A DAY WITH MEALS (BREAKFAST AND SUPPER)   [DISCONTINUED] fluticasone (FLONASE) 50 MCG/ACT nasal spray 2 SPRAYS IN EACH NOSTRIL ONCE A DAY AS NEEDED (Patient not taking: No sig reported)   Facility-Administered Encounter Medications as of 02/11/2021  Medication   cyanocobalamin ((VITAMIN B-12)) injection 1,000 mcg    Review of Systems  Constitutional:  Negative for chills and fever.  HENT:  Positive for congestion, postnasal drip, rhinorrhea and sinus pressure. Negative for ear discharge, ear pain, sneezing, sore throat and voice change.   Eyes:  Negative for pain, discharge, redness and visual disturbance.  Respiratory:  Positive for cough and shortness of breath. Negative for wheezing.   Cardiovascular:  Negative for chest pain and leg swelling.  Musculoskeletal:  Negative for back pain and gait problem.  Skin:  Negative for rash.  All other systems reviewed and are negative.  Observations/Objective: Patient sounds comfortable and in no acute distress  Assessment and Plan: Problem List Items Addressed This Visit   None Visit Diagnoses     Bronchitis    -  Primary   Relevant Medications   fluticasone (FLONASE) 50 MCG/ACT nasal spray   doxycycline (VIBRA-TABS) 100 MG tablet       Will treat like sinus and bronchitis Follow up plan: Return if symptoms worsen or fail to improve.     I discussed the assessment and treatment plan with the patient. The patient  was provided an opportunity to ask questions and all were answered. The patient agreed with the plan and demonstrated an understanding of the instructions.   The patient was advised to call back or seek an in-person evaluation if the symptoms worsen or if the condition fails to improve as anticipated.  The above assessment and management plan was discussed with the patient. The patient verbalized understanding of and has agreed to the management plan. Patient is aware to call the clinic if symptoms persist or worsen. Patient is aware when to return to the clinic for a follow-up visit. Patient educated on when it is appropriate to go to the emergency department.    I provided 7 minutes of non-face-to-face time during this encounter.    Worthy Rancher, MD

## 2021-02-11 NOTE — Addendum Note (Signed)
Addended by: Caryl Pina on: 02/11/2021 04:30 PM   Modules accepted: Orders

## 2021-02-16 ENCOUNTER — Ambulatory Visit (INDEPENDENT_AMBULATORY_CARE_PROVIDER_SITE_OTHER): Payer: Medicare Other | Admitting: Family Medicine

## 2021-02-16 ENCOUNTER — Ambulatory Visit: Payer: Medicare Other | Admitting: Family Medicine

## 2021-02-16 ENCOUNTER — Encounter: Payer: Self-pay | Admitting: Family Medicine

## 2021-02-16 ENCOUNTER — Other Ambulatory Visit: Payer: Self-pay

## 2021-02-16 VITALS — BP 134/64 | HR 65 | Temp 97.0°F | Ht 76.0 in | Wt 181.4 lb

## 2021-02-16 DIAGNOSIS — N400 Enlarged prostate without lower urinary tract symptoms: Secondary | ICD-10-CM

## 2021-02-16 DIAGNOSIS — I10 Essential (primary) hypertension: Secondary | ICD-10-CM

## 2021-02-16 DIAGNOSIS — Z13220 Encounter for screening for lipoid disorders: Secondary | ICD-10-CM | POA: Diagnosis not present

## 2021-02-16 DIAGNOSIS — Z125 Encounter for screening for malignant neoplasm of prostate: Secondary | ICD-10-CM

## 2021-02-16 DIAGNOSIS — K2100 Gastro-esophageal reflux disease with esophagitis, without bleeding: Secondary | ICD-10-CM | POA: Diagnosis not present

## 2021-02-16 MED ORDER — ESOMEPRAZOLE MAGNESIUM 40 MG PO CPDR: DELAYED_RELEASE_CAPSULE | ORAL | 1 refills | Status: DC

## 2021-02-16 MED ORDER — LOSARTAN POTASSIUM 100 MG PO TABS: ORAL_TABLET | ORAL | 1 refills | Status: DC

## 2021-02-16 MED ORDER — FAMOTIDINE 20 MG PO TABS: 20.0000 mg | ORAL_TABLET | Freq: Every day | ORAL | 3 refills | Status: DC

## 2021-02-16 MED ORDER — AMLODIPINE BESYLATE 5 MG PO TABS: ORAL_TABLET | ORAL | 1 refills | Status: DC

## 2021-02-16 NOTE — Progress Notes (Signed)
Subjective:  Patient ID: Matthew [name], male    DOB: [birth year]  Age: 70 y.o. MRN: [id number]  CC: Medical Management of Chronic Issues   HPI Jaelon Gatley Pyon presents for follow-up of hypertension. Patient has no history of headache chest pain or shortness of breath or recent cough. Patient also denies symptoms of TIA such as numbness weakness lateralizing. Patient checks  blood pressure at home and has not had any elevated readings recently. Patient denies side effects from his medication. States taking it regularly. Patient in for follow-up of GERD. Currently asymptomatic taking  PPI daily. There is no chest pain or heartburn. No hematemesis and no melena. No dysphagia or choking. Onset is remote. Progression is stable. Complicating factors, none.  he also has some nighttime urination and history of BPH.  Depression screen [facility] 2/9 02/16/2021 12/15/2020 10/12/2020  Decreased Interest 1 0 3  Down, Depressed, Hopeless 0 0 2  PHQ - 2 Score 1 0 5  Altered sleeping 3 - 3  Tired, decreased energy 2 - 1  Change in appetite 0 - 0  Feeling bad or failure about yourself  0 - 0  Trouble concentrating 3 - 0  Moving slowly or fidgety/restless 0 - 1  Suicidal thoughts 0 - 0  PHQ-9 Score 9 - 10  Difficult doing work/chores Not difficult at all - Not difficult at all    History Renn has a past medical history of Agoraphobia, Anemia, Anxiety, Arthritis, Asthma, Barrett esophagus, Blood transfusion without reported diagnosis, Bronchitis, Chronic kidney disease, Complication of anesthesia, Elevated serum creatinine, Esophageal stricture, GERD (gastroesophageal reflux disease), Hypertension, Mood disorder ([place]), Neck pain, chronic, Neuromuscular disorder ([place]), Rosacea, and Vitamin B12 deficiency.   He has a past surgical history that includes Chest tube insertion; Fracture surgery; Wisdom tooth extraction; Lumbar laminectomy/decompression microdiscectomy (12/06/2011); Colonoscopy (08/20/2003); Upper  gastrointestinal endoscopy (03/15/2013); and skin cancer removal from forehead.   His family history includes Diabetes in his mother; Heart disease in his mother; Stroke in his mother.He reports that he quit smoking about 35 years ago. His smoking use included cigarettes. He has a 0.90 pack-year smoking history. He has never used smokeless tobacco. He reports that he does not drink alcohol and does not use drugs.    ROS Review of Systems  Constitutional:  Negative for fever.  Respiratory:  Negative for shortness of breath.   Cardiovascular:  Negative for chest pain.  Musculoskeletal:  Negative for arthralgias.  Skin:  Negative for rash.   Objective:  BP 134/64   Pulse 65   Temp (!) 97 F (36.1 C)   Ht _0  (1.93 m)   Wt 181 lb 6.4 oz (82.3 kg)   SpO2 100%   BMI 22.08 kg/m   BP Readings from Last 3 Encounters:  02/16/21 134/64  12/15/20 135/69  07/13/20 132/70    Wt Readings from Last 3 Encounters:  02/16/21 181 lb 6.4 oz (82.3 kg)  12/15/20 179 lb (81.2 kg)  10/12/20 181 lb 3.2 oz (82.2 kg)     Physical Exam Vitals reviewed.  Constitutional:      Appearance: He is well-developed.  HENT:     Head: Normocephalic and atraumatic.     Right Ear: External ear normal.     Left Ear: External ear normal.     Mouth/Throat:     Pharynx: No oropharyngeal exudate or posterior oropharyngeal erythema.  Eyes:     Pupils: Pupils are equal, round, and reactive to light.  Cardiovascular:  Rate and Rhythm: Normal rate and regular rhythm.     Heart sounds: No murmur heard. Pulmonary:     Effort: No respiratory distress.     Breath sounds: Normal breath sounds.  Musculoskeletal:     Cervical back: Normal range of motion and neck supple.  Neurological:     Mental Status: He is alert and oriented to person, place, and time.      Assessment & Plan:   Beecher was seen today for medical management of chronic issues.  Diagnoses and all orders for this visit:  Essential  hypertension -     CBC with Differential/Platelet -     CMP14+EGFR -     amLODipine (NORVASC) 5 MG tablet; TAKE 1 TABLET ONCE DAILY IN THE EVENING -     losartan (COZAAR) 100 MG tablet; TAKE (1) TABLET DAILY IN THE MORNING.  Lipid screening -     Lipid panel  Prostate cancer screening -     PSA, total and free  Gastroesophageal reflux disease with esophagitis without hemorrhage -     esomeprazole (NEXIUM) 40 MG capsule; TAKE (1) CAPSULE DAILY  Benign prostatic hyperplasia without lower urinary tract symptoms -     PSA, total and free  Other orders -     famotidine (PEPCID) 20 MG tablet; Take 1 tablet (20 mg total) by mouth at bedtime.      I have discontinued Baer C. Sauber's doxycycline. I am also having him maintain his clobetasol, diclofenac Sodium, metroNIDAZOLE, carbamazepine, trihexyphenidyl, lidocaine, Ativan, chlorproMAZINE, fluticasone, amLODipine, losartan, famotidine, and esomeprazole. We will continue to administer cyanocobalamin.  Allergies as of 02/16/2021       Reactions   Acetaminophen Nausea And Vomiting   Amoxicillin    REACTION: unspecified/unknown   Cephalexin Swelling   In hands   Erythromycin    REACTION:Increases tegretol level   Nsaids Other (See Comments)   REACTION: Cannot take due to kidney health   Penicillins    REACTION: Unknown        Medication List        Accurate as of February 16, 2021  5:41 PM. If you have any questions, ask your nurse or doctor.          STOP taking these medications    doxycycline 100 MG tablet Commonly known as: VIBRA-TABS Stopped by: Claretta Fraise, MD       TAKE these medications    amLODipine 5 MG tablet Commonly known as: NORVASC TAKE 1 TABLET ONCE DAILY IN THE EVENING   Ativan 1 MG tablet Generic drug: LORazepam TAKE ONE TABLET FOUR TIMES DAILY.   carbamazepine 200 MG tablet Commonly known as: TEGretol TAKE (1/2) TABLET FOUR TIMES DAILY.   chlorproMAZINE 100 MG tablet Commonly  known as: THORAZINE TAKE 1 TABLET 4 TIMES A DAY   clobetasol 0.05 % external solution Commonly known as: TEMOVATE Apply topically 2 (two) times daily.   diclofenac Sodium 1 % Gel Commonly known as: Voltaren Apply 2 g topically 4 (four) times daily.   esomeprazole 40 MG capsule Commonly known as: NEXIUM TAKE (1) CAPSULE DAILY   famotidine 20 MG tablet Commonly known as: PEPCID Take 1 tablet (20 mg total) by mouth at bedtime.   fluticasone 50 MCG/ACT nasal spray Commonly known as: FLONASE 2 SPRAYS IN EACH NOSTRIL ONCE A DAY AS NEEDED   lidocaine 5 % Commonly known as: Lidoderm Place 1 patch onto the skin daily. Remove & Discard patch within 12 hours or as directed by  MD   losartan 100 MG tablet Commonly known as: COZAAR TAKE (1) TABLET DAILY IN THE MORNING.   metroNIDAZOLE 0.75 % cream Commonly known as: METROCREAM Apply topically 2 (two) times daily.   trihexyphenidyl 2 MG tablet Commonly known as: ARTANE TAKE  (1)  TABLET TWICE A DAY WITH MEALS (BREAKFAST AND SUPPER)         Follow-up: Return in about 6 months (around 08/16/2021).  Claretta Fraise, M.D.

## 2021-02-22 ENCOUNTER — Other Ambulatory Visit: Payer: Medicare Other

## 2021-02-22 ENCOUNTER — Ambulatory Visit (INDEPENDENT_AMBULATORY_CARE_PROVIDER_SITE_OTHER): Payer: Medicare Other

## 2021-02-22 DIAGNOSIS — E538 Deficiency of other specified B group vitamins: Secondary | ICD-10-CM | POA: Diagnosis not present

## 2021-02-22 NOTE — Progress Notes (Signed)
Cyanocobalamin injection given to left deltoid.  Patient tolerated well. 

## 2021-02-23 ENCOUNTER — Ambulatory Visit (INDEPENDENT_AMBULATORY_CARE_PROVIDER_SITE_OTHER): Payer: Medicare Other | Admitting: Family Medicine

## 2021-02-23 ENCOUNTER — Encounter: Payer: Self-pay | Admitting: Family Medicine

## 2021-02-23 VITALS — BP 121/46 | HR 76 | Temp 97.3°F | Ht 76.0 in | Wt 181.8 lb

## 2021-02-23 DIAGNOSIS — R051 Acute cough: Secondary | ICD-10-CM

## 2021-02-23 DIAGNOSIS — F259 Schizoaffective disorder, unspecified: Secondary | ICD-10-CM | POA: Diagnosis not present

## 2021-02-23 LAB — LIPID PANEL
Chol/HDL Ratio: 2.6 ratio (ref 0.0–5.0)
Cholesterol, Total: 147 mg/dL (ref 100–199)
HDL: 57 mg/dL (ref >39)
LDL Chol Calc (NIH): 74 mg/dL (ref 0–99)
Triglycerides: 82 mg/dL (ref 0–149)
VLDL Cholesterol Cal: 16 mg/dL (ref 5–40)

## 2021-02-23 LAB — CMP14+EGFR
ALT: 22 IU/L (ref 0–44)
AST: 18 IU/L (ref 0–40)
Albumin/Globulin Ratio: 1.4 (ref 1.2–2.2)
Albumin: 4 g/dL (ref 3.8–4.8)
Alkaline Phosphatase: 133 IU/L — ABNORMAL HIGH (ref 44–121)
BUN/Creatinine Ratio: 17 (ref 10–24)
BUN: 28 mg/dL — ABNORMAL HIGH (ref 8–27)
Bilirubin Total: 0.3 mg/dL (ref 0.0–1.2)
CO2: 22 mmol/L (ref 20–29)
Calcium: 9.2 mg/dL (ref 8.6–10.2)
Chloride: 105 mmol/L (ref 96–106)
Creatinine, Ser: 1.62 mg/dL — ABNORMAL HIGH (ref 0.76–1.27)
Globulin, Total: 2.9 g/dL (ref 1.5–4.5)
Glucose: 92 mg/dL (ref 70–99)
Potassium: 4.9 mmol/L (ref 3.5–5.2)
Sodium: 139 mmol/L (ref 134–144)
Total Protein: 6.9 g/dL (ref 6.0–8.5)
eGFR: 46 mL/min/{1.73_m2} — ABNORMAL LOW (ref >59)

## 2021-02-23 LAB — CBC WITH DIFFERENTIAL/PLATELET
Basophils Absolute: 0 10*3/uL (ref 0.0–0.2)
Basos: 0 %
EOS (ABSOLUTE): 0.1 10*3/uL (ref 0.0–0.4)
Eos: 2 %
Hematocrit: 33.2 % — ABNORMAL LOW (ref 37.5–51.0)
Hemoglobin: 11.3 g/dL — ABNORMAL LOW (ref 13.0–17.7)
Immature Grans (Abs): 0.1 10*3/uL (ref 0.0–0.1)
Immature Granulocytes: 1 %
Lymphocytes Absolute: 1.3 10*3/uL (ref 0.7–3.1)
Lymphs: 21 %
MCH: 29.1 pg (ref 26.6–33.0)
MCHC: 34 g/dL (ref 31.5–35.7)
MCV: 86 fL (ref 79–97)
Monocytes Absolute: 0.7 10*3/uL (ref 0.1–0.9)
Monocytes: 11 %
Neutrophils Absolute: 4.2 10*3/uL (ref 1.4–7.0)
Neutrophils: 65 %
Platelets: 260 10*3/uL (ref 150–450)
RBC: 3.88 x10E6/uL — ABNORMAL LOW (ref 4.14–5.80)
RDW: 12.3 % (ref 11.6–15.4)
WBC: 6.5 10*3/uL (ref 3.4–10.8)

## 2021-02-23 LAB — PSA, TOTAL AND FREE
PSA, Free Pct: 37.5 %
PSA, Free: 0.45 ng/mL
Prostate Specific Ag, Serum: 1.2 ng/mL (ref 0.0–4.0)

## 2021-02-23 MED ORDER — LEVOFLOXACIN 500 MG PO TABS: 500.0000 mg | ORAL_TABLET | Freq: Every day | ORAL | 0 refills | Status: DC

## 2021-02-23 MED ORDER — BENZONATATE 200 MG PO CAPS: 200.0000 mg | ORAL_CAPSULE | Freq: Three times a day (TID) | ORAL | 0 refills | Status: DC | PRN

## 2021-02-23 NOTE — Progress Notes (Signed)
Subjective:  Patient ID: Matthew [name], male    DOB: Sep 20, 1950  Age: 70 y.o. MRN: [id number]  CC: Cough   HPI Matthew [name] presents for cough starting 1-2 weeks ago. Not productive. Irritates breathing passages. Not dyspneic. Walking for exercise. Has cut back due to cough.   Depression screen [facility] 2/9 02/23/2021 02/16/2021 12/15/2020  Decreased Interest 0 1 0  Down, Depressed, Hopeless 0 0 0  PHQ - 2 Score 0 1 0  Altered sleeping 3 3 -  Tired, decreased energy 0 2 -  Change in appetite 0 0 -  Feeling bad or failure about yourself  0 0 -  Trouble concentrating 3 3 -  Moving slowly or fidgety/restless 0 0 -  Suicidal thoughts 0 0 -  PHQ-9 Score 6 9 -  Difficult doing work/chores Not difficult at all Not difficult at all -    History Matthew [name] has a past medical history of Agoraphobia, Anemia, Anxiety, Arthritis, Asthma, Barrett esophagus, Blood transfusion without reported diagnosis, Bronchitis, Chronic kidney disease, Complication of anesthesia, Elevated serum creatinine, Esophageal stricture, GERD (gastroesophageal reflux disease), Hypertension, Mood disorder ([place]), Neck pain, chronic, Neuromuscular disorder ([place]), Rosacea, and Vitamin B12 deficiency.   He has a past surgical history that includes Chest tube insertion; Fracture surgery; Wisdom tooth extraction; Lumbar laminectomy/decompression microdiscectomy (12/06/2011); Colonoscopy (08/20/2003); Upper gastrointestinal endoscopy (03/15/2013); and skin cancer removal from forehead.   His family history includes Diabetes in his mother; Heart disease in his mother; Stroke in his mother.He reports that he quit smoking about 36 years ago. His smoking use included cigarettes. He has a 0.90 pack-year smoking history. He has never used smokeless tobacco. He reports that he does not drink alcohol and does not use drugs.    ROS Review of Systems  Constitutional:  Negative for activity change, appetite change, chills and fever.  HENT:   Positive for congestion, rhinorrhea and sore throat. Negative for ear discharge, ear pain, hearing loss, nosebleeds, sneezing and trouble swallowing.   Respiratory:  Positive for cough. Negative for chest tightness and shortness of breath.   Cardiovascular:  Negative for chest pain and palpitations.  Skin:  Negative for rash.   Objective:  BP (!) 121/46   Pulse 76   Temp (!) 97.3 F (36.3 C)   Ht 6\' 4"  (1.93 m)   Wt 181 lb 12.8 oz (82.5 kg)   SpO2 100%   BMI 22.13 kg/m   BP Readings from Last 3 Encounters:  02/23/21 (!) 121/46  02/16/21 134/64  12/15/20 135/69    Wt Readings from Last 3 Encounters:  02/23/21 181 lb 12.8 oz (82.5 kg)  02/16/21 181 lb 6.4 oz (82.3 kg)  12/15/20 179 lb (81.2 kg)     Physical Exam    Assessment & Plan:   Matthew [name] was seen today for cough.  Diagnoses and all orders for this visit:  Acute cough  Schizoaffective disorder, unspecified type ([place])  Other orders -     benzonatate (TESSALON) 200 MG capsule; Take 1 capsule (200 mg total) by mouth 3 (three) times daily as needed for cough. -     levofloxacin (LEVAQUIN) 500 MG tablet; Take 1 tablet (500 mg total) by mouth daily.      I am having Matthew [name] start on benzonatate and levofloxacin. I am also having him maintain his clobetasol, diclofenac Sodium, metroNIDAZOLE, carbamazepine, trihexyphenidyl, lidocaine, Ativan, chlorproMAZINE, fluticasone, amLODipine, losartan, famotidine, and esomeprazole.  Allergies as of 02/23/2021  Reactions   Acetaminophen Nausea And Vomiting   Amoxicillin    REACTION: unspecified/unknown   Cephalexin Swelling   In hands   Erythromycin    REACTION:Increases tegretol level   Nsaids Other (See Comments)   REACTION: Cannot take due to kidney health   Penicillins    REACTION: Unknown        Medication List        Accurate as of February 23, 2021  3:48 PM. If you have any questions, ask your nurse or doctor.          amLODipine 5  MG tablet Commonly known as: NORVASC TAKE 1 TABLET ONCE DAILY IN THE EVENING   Ativan 1 MG tablet Generic drug: LORazepam TAKE ONE TABLET FOUR TIMES DAILY.   benzonatate 200 MG capsule Commonly known as: TESSALON Take 1 capsule (200 mg total) by mouth 3 (three) times daily as needed for cough. Started by: Claretta Fraise, MD   carbamazepine 200 MG tablet Commonly known as: TEGretol TAKE (1/2) TABLET FOUR TIMES DAILY.   chlorproMAZINE 100 MG tablet Commonly known as: THORAZINE TAKE 1 TABLET 4 TIMES A DAY   clobetasol 0.05 % external solution Commonly known as: TEMOVATE Apply topically 2 (two) times daily.   diclofenac Sodium 1 % Gel Commonly known as: Voltaren Apply 2 g topically 4 (four) times daily.   esomeprazole 40 MG capsule Commonly known as: NEXIUM TAKE (1) CAPSULE DAILY   famotidine 20 MG tablet Commonly known as: PEPCID Take 1 tablet (20 mg total) by mouth at bedtime.   fluticasone 50 MCG/ACT nasal spray Commonly known as: FLONASE 2 SPRAYS IN EACH NOSTRIL ONCE A DAY AS NEEDED   levofloxacin 500 MG tablet Commonly known as: LEVAQUIN Take 1 tablet (500 mg total) by mouth daily. Started by: Claretta Fraise, MD   lidocaine 5 % Commonly known as: Lidoderm Place 1 patch onto the skin daily. Remove & Discard patch within 12 hours or as directed by MD   losartan 100 MG tablet Commonly known as: COZAAR TAKE (1) TABLET DAILY IN THE MORNING.   metroNIDAZOLE 0.75 % cream Commonly known as: METROCREAM Apply topically 2 (two) times daily.   trihexyphenidyl 2 MG tablet Commonly known as: ARTANE TAKE  (1)  TABLET TWICE A DAY WITH MEALS (BREAKFAST AND SUPPER)         Follow-up: Return if symptoms worsen or fail to improve.  Claretta Fraise, M.D.

## 2021-03-03 ENCOUNTER — Telehealth (INDEPENDENT_AMBULATORY_CARE_PROVIDER_SITE_OTHER): Payer: Medicare Other | Admitting: Psychiatry

## 2021-03-03 ENCOUNTER — Ambulatory Visit (INDEPENDENT_AMBULATORY_CARE_PROVIDER_SITE_OTHER): Payer: Medicare Other | Admitting: Family Medicine

## 2021-03-03 ENCOUNTER — Encounter: Payer: Self-pay | Admitting: Family Medicine

## 2021-03-03 ENCOUNTER — Other Ambulatory Visit: Payer: Self-pay

## 2021-03-03 ENCOUNTER — Encounter: Payer: Self-pay | Admitting: Psychiatry

## 2021-03-03 ENCOUNTER — Ambulatory Visit (INDEPENDENT_AMBULATORY_CARE_PROVIDER_SITE_OTHER): Payer: Medicare Other

## 2021-03-03 VITALS — BP 124/67 | HR 88 | Temp 97.1°F | Ht 76.0 in | Wt 173.0 lb

## 2021-03-03 DIAGNOSIS — R059 Cough, unspecified: Secondary | ICD-10-CM

## 2021-03-03 DIAGNOSIS — G3184 Mild cognitive impairment, so stated: Secondary | ICD-10-CM

## 2021-03-03 DIAGNOSIS — G2119 Other drug induced secondary parkinsonism: Secondary | ICD-10-CM

## 2021-03-03 DIAGNOSIS — Z87820 Personal history of traumatic brain injury: Secondary | ICD-10-CM

## 2021-03-03 DIAGNOSIS — F411 Generalized anxiety disorder: Secondary | ICD-10-CM

## 2021-03-03 DIAGNOSIS — F4001 Agoraphobia with panic disorder: Secondary | ICD-10-CM | POA: Diagnosis not present

## 2021-03-03 DIAGNOSIS — F259 Schizoaffective disorder, unspecified: Secondary | ICD-10-CM | POA: Diagnosis not present

## 2021-03-03 DIAGNOSIS — R634 Abnormal weight loss: Secondary | ICD-10-CM | POA: Diagnosis not present

## 2021-03-03 MED ORDER — ALBUTEROL SULFATE HFA 108 (90 BASE) MCG/ACT IN AERS: 2.0000 | INHALATION_SPRAY | Freq: Four times a day (QID) | RESPIRATORY_TRACT | 2 refills | Status: DC | PRN

## 2021-03-03 MED ORDER — PREDNISONE 20 MG PO TABS
40.0000 mg | ORAL_TABLET | Freq: Every day | ORAL | 0 refills | Status: AC
Start: 2021-03-03 — End: 2021-03-08
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
## 2021-03-04 ENCOUNTER — Other Ambulatory Visit: Payer: Self-pay | Admitting: Family Medicine

## 2021-03-04 LAB — CBC WITH DIFFERENTIAL/PLATELET
Basophils Absolute: 0 10*3/uL (ref 0.0–0.2)
Basos: 1 %
EOS (ABSOLUTE): 0 10*3/uL (ref 0.0–0.4)
Eos: 0 %
Hematocrit: 32.5 % — ABNORMAL LOW (ref 37.5–51.0)
Hemoglobin: 11.1 g/dL — ABNORMAL LOW (ref 13.0–17.7)
Immature Grans (Abs): 0 10*3/uL (ref 0.0–0.1)
Immature Granulocytes: 0 %
Lymphocytes Absolute: 0.6 10*3/uL — ABNORMAL LOW (ref 0.7–3.1)
Lymphs: 10 %
MCH: 29.5 pg (ref 26.6–33.0)
MCHC: 34.2 g/dL (ref 31.5–35.7)
MCV: 86 fL (ref 79–97)
Monocytes Absolute: 0.7 10*3/uL (ref 0.1–0.9)
Monocytes: 10 %
Neutrophils Absolute: 5.2 10*3/uL (ref 1.4–7.0)
Neutrophils: 79 %
Platelets: 216 10*3/uL (ref 150–450)
RBC: 3.76 x10E6/uL — ABNORMAL LOW (ref 4.14–5.80)
RDW: 12.3 % (ref 11.6–15.4)
WBC: 6.6 10*3/uL (ref 3.4–10.8)

## 2021-03-04 LAB — CMP14+EGFR
ALT: 22 IU/L (ref 0–44)
AST: 24 IU/L (ref 0–40)
Albumin/Globulin Ratio: 1.5 (ref 1.2–2.2)
Albumin: 4.3 g/dL (ref 3.8–4.8)
Alkaline Phosphatase: 135 IU/L — ABNORMAL HIGH (ref 44–121)
BUN/Creatinine Ratio: 14 (ref 10–24)
BUN: 27 mg/dL (ref 8–27)
Bilirubin Total: 0.2 mg/dL (ref 0.0–1.2)
CO2: 21 mmol/L (ref 20–29)
Calcium: 9.7 mg/dL (ref 8.6–10.2)
Chloride: 100 mmol/L (ref 96–106)
Creatinine, Ser: 1.99 mg/dL — ABNORMAL HIGH (ref 0.76–1.27)
Globulin, Total: 2.9 g/dL (ref 1.5–4.5)
Glucose: 114 mg/dL — ABNORMAL HIGH (ref 70–99)
Potassium: 4.9 mmol/L (ref 3.5–5.2)
Sodium: 136 mmol/L (ref 134–144)
Total Protein: 7.2 g/dL (ref 6.0–8.5)
eGFR: 36 mL/min/{1.73_m2} — ABNORMAL LOW (ref >59)

## 2021-03-04 LAB — THYROID PANEL WITH TSH
Free Thyroxine Index: 1.8 (ref 1.2–4.9)
T3 Uptake Ratio: 27 % (ref 24–39)
T4, Total: 6.5 ug/dL (ref 4.5–12.0)
TSH: 2.15 u[IU]/mL (ref 0.450–4.500)

## 2021-03-05 ENCOUNTER — Telehealth: Payer: Self-pay | Admitting: Family Medicine

## 2021-03-05 NOTE — Telephone Encounter (Signed)
Patient aware and verbalized understanding. Wanted labs faxed to [facility] faxed to them

## 2021-03-05 NOTE — Telephone Encounter (Signed)
Patient worried if prednisone is causing labs to elwvate. He is concerned if prednisone is harming his kidney's.  I informed under advisement from Monia Pouch, FNP that prednisone will cause elevated glucose. It is not gonna damage kidneys unless it remains elevated on a chronic level.

## 2021-03-11 ENCOUNTER — Encounter: Payer: Self-pay | Admitting: Family Medicine

## 2021-03-11 ENCOUNTER — Ambulatory Visit (INDEPENDENT_AMBULATORY_CARE_PROVIDER_SITE_OTHER): Payer: Medicare Other | Admitting: Family Medicine

## 2021-03-11 VITALS — BP 136/68 | HR 92 | Temp 96.8°F | Ht 76.0 in | Wt 179.0 lb

## 2021-03-11 DIAGNOSIS — N1832 Chronic kidney disease, stage 3b: Secondary | ICD-10-CM | POA: Diagnosis not present

## 2021-03-11 DIAGNOSIS — R3911 Hesitancy of micturition: Secondary | ICD-10-CM

## 2021-03-11 DIAGNOSIS — I10 Essential (primary) hypertension: Secondary | ICD-10-CM

## 2021-03-11 LAB — URINALYSIS, ROUTINE W REFLEX MICROSCOPIC
Bilirubin, UA: NEGATIVE
Glucose, UA: NEGATIVE
Ketones, UA: NEGATIVE
Leukocytes,UA: NEGATIVE
Nitrite, UA: NEGATIVE
Protein,UA: NEGATIVE
RBC, UA: NEGATIVE
Specific Gravity, UA: 1.01 (ref 1.005–1.030)
Urobilinogen, Ur: 0.2 mg/dL (ref 0.2–1.0)
pH, UA: 7 (ref 5.0–7.5)

## 2021-03-11 NOTE — Patient Instructions (Signed)
Chronic Kidney Disease, Adult Chronic kidney disease (CKD) occurs when the kidneys are slowly and permanently damaged over a long period of time. The kidneys are a pair of organs that do many important jobs in the body, including: Removing waste and extra fluid from the blood to make urine. Making hormones that maintain the amount of fluid in tissues and blood vessels. Maintaining the right amount of fluids and chemicals in the body. A small amount of kidney damage may not cause problems, but a large amount of damage may make it hard or impossible for the kidneys to work right. Steps must be taken to slow kidney damage or to stop it from getting worse. If steps are not taken, the kidneys may stop working permanently (end-stage renal disease, or ESRD). Most of the time, CKD does not go away, but it can often becontrolled. People who have CKD are usually able to live full lives. What are the causes? The most common causes of this condition are diabetes and high blood pressure (hypertension). Other causes include: Cardiovascular diseases. These affect the heart and blood vessels. Kidney diseases. These include: Glomerulonephritis, or inflammation of the tiny filters in the kidneys. Interstitial nephritis. This is swelling of the small tubes of the kidneys and of the surrounding structures. Polycystic kidney disease, in which clusters of fluid-filled sacs form within the kidneys. Renal vascular disease. This includes disorders that affect the arteries and veins of the kidneys. Diseases that affect the body's defense system (immune system). A problem with urine flow. This may be caused by: Kidney stones. Cancer. An enlarged prostate, in males. A kidney infection or urinary tract infection (UTI) that keeps coming back. Vasculitis. This is swelling or inflammation of the blood vessels. What increases the risk? Your chances of having kidney disease increase with age. The following factors may make  you more likely to develop this condition: A family history of kidney disease or kidney failure. Kidney failure means the kidneys can no longer work right. Certain genetic diseases. Taking medicines often that are damaging to the kidneys. Being around or being in contact with toxic substances. Obesity. A history of tobacco use. What are the signs or symptoms? Symptoms of this condition include: Feeling very tired (lethargic) and having less energy. Swelling, or edema, of the face, legs, ankles, or feet. Nausea or vomiting, or loss of appetite. Confusion or trouble concentrating. Muscle twitches and cramps, especially in the legs. Dry, itchy skin. A metallic taste in the mouth. Producing less urine, or producing more urine (especially at night). Shortness of breath. Trouble sleeping. CKD may also result in not having enough red blood cells or hemoglobin in the blood (anemia) or having weak bones (bone disease). Symptoms develop slowly and may not be obvious until the kidney damage becomessevere. It is possible to have kidney disease for years without having symptoms. How is this diagnosed? This condition may be diagnosed based on: Blood tests. Urine tests. Imaging tests, such as an ultrasound or a CT scan. A kidney biopsy. This involves removing a sample of kidney tissue to be looked at under a microscope. Results from these tests will help to determine how serious the CKD is. How is this treated? There is no cure for most cases of this condition, but treatment usually relieves symptoms and prevents or slows the worsening of the disease. Treatment may include: Diet changes, which may require you to avoid alcohol and foods that are high in salt, potassium, phosphorous, and protein. Medicines. These may: Lower blood   pressure. Control blood sugar (glucose). Relieve anemia. Relieve swelling. Protect your bones. Improve the balance of salts and minerals in your blood  (electrolytes). Dialysis, which is a type of treatment that removes toxic waste from the body. It may be needed if you have kidney failure. Managing any other conditions that are causing your CKD or making it worse. Follow these instructions at home: Medicines Take over-the-counter and prescription medicines only as told by your health care provider. The amount of some medicines that you take may need to be changed. Do not take any new medicines unless approved by your health care provider. Many medicines can make kidney damage worse. Do not take any vitamin and mineral supplements unless approved by your health care provider. Many nutritional supplements can make kidney damage worse. Lifestyle  Do not use any products that contain nicotine or tobacco, such as cigarettes, e-cigarettes, and chewing tobacco. If you need help quitting, ask your health care provider. If you drink alcohol: Limit how much you use to: 0-1 drink a day for women who are not pregnant. 0-2 drinks a day for men. Know how much alcohol is in your drink. In the [place] one drink equals one 12 oz bottle of beer (355 mL), one 5 oz glass of wine (148 mL), or one 1 oz glass of hard liquor (44 mL). Maintain a healthy weight. If you need help, ask your health care provider.  General instructions  Follow instructions from your health care provider about eating or drinking restrictions, including any prescribed diet. Track your blood pressure at home. Report changes in your blood pressure as told. If you are being treated for diabetes, track your blood glucose levels as told. Start or continue an exercise plan. Exercise at least 30 minutes a day, 5 days a week. Keep your immunizations up to date as told. Keep all follow-up visits. This is important.  Where to find more information American Association of Kidney Patients: www.aakp.org National Kidney Foundation: www.kidney.org American Kidney Fund: www.akfinc.org Life Options:  www.lifeoptions.org Kidney School: www.kidneyschool.org Contact a health care provider if: Your symptoms get worse. You develop new symptoms. Get help right away if: You develop symptoms of ESRD. These include: Headaches. Numbness in your hands or feet. Easy bruising. Frequent hiccups. Chest pain. Shortness of breath. Lack of menstrual periods, in women. You have a fever. You are producing less urine than usual. You have pain or bleeding when you urinate or when you have a bowel movement. These symptoms may represent a serious problem that is an emergency. Do not wait to see if the symptoms will go away. Get medical help right away. Call your local emergency services (911 in the [place] Do not drive yourself to the hospital. Summary Chronic kidney disease (CKD) occurs when the kidneys become damaged slowly over a long period of time. The most common causes of this condition are diabetes and high blood pressure (hypertension). There is no cure for most cases of CKD, but treatment usually relieves symptoms and prevents or slows the worsening of the disease. Treatment may include a combination of lifestyle changes, medicines, and dialysis. This information is not intended to replace advice given to you by your health care provider. Make sure you discuss any questions you have with your healthcare provider. Document Revised: 06/19/2019 Document Reviewed: 06/19/2019 Elsevier Patient Education  2022 [employer]  
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
## 2021-03-12 ENCOUNTER — Other Ambulatory Visit: Payer: Self-pay | Admitting: Psychiatry

## 2021-03-12 DIAGNOSIS — F411 Generalized anxiety disorder: Secondary | ICD-10-CM

## 2021-03-12 DIAGNOSIS — F259 Schizoaffective disorder, unspecified: Secondary | ICD-10-CM

## 2021-03-12 DIAGNOSIS — F4001 Agoraphobia with panic disorder: Secondary | ICD-10-CM

## 2021-03-15 ENCOUNTER — Other Ambulatory Visit: Payer: Self-pay | Admitting: Family Medicine

## 2021-03-15 ENCOUNTER — Telehealth: Payer: Self-pay | Admitting: Family Medicine

## 2021-03-15 DIAGNOSIS — F411 Generalized anxiety disorder: Secondary | ICD-10-CM

## 2021-03-15 DIAGNOSIS — I10 Essential (primary) hypertension: Secondary | ICD-10-CM

## 2021-03-15 DIAGNOSIS — K2100 Gastro-esophageal reflux disease with esophagitis, without bleeding: Secondary | ICD-10-CM

## 2021-03-15 NOTE — Telephone Encounter (Signed)
Lab ordered.

## 2021-03-15 NOTE — Telephone Encounter (Signed)
Pt called to let us know that he will be coming in to office tomorrow to do his lab work for his upcoming appt on 12/22. Please add future lab order for pt.

## 2021-03-16 ENCOUNTER — Other Ambulatory Visit: Payer: Medicare Other

## 2021-03-16 DIAGNOSIS — F411 Generalized anxiety disorder: Secondary | ICD-10-CM

## 2021-03-16 DIAGNOSIS — I10 Essential (primary) hypertension: Secondary | ICD-10-CM

## 2021-03-16 DIAGNOSIS — K2100 Gastro-esophageal reflux disease with esophagitis, without bleeding: Secondary | ICD-10-CM

## 2021-03-16 LAB — CMP14+EGFR
ALT: 28 IU/L (ref 0–44)
AST: 26 IU/L (ref 0–40)
Albumin/Globulin Ratio: 1.6 (ref 1.2–2.2)
Albumin: 4.2 g/dL (ref 3.8–4.8)
Alkaline Phosphatase: 138 IU/L — ABNORMAL HIGH (ref 44–121)
BUN/Creatinine Ratio: 11 (ref 10–24)
BUN: 19 mg/dL (ref 8–27)
Bilirubin Total: 0.4 mg/dL (ref 0.0–1.2)
CO2: 24 mmol/L (ref 20–29)
Calcium: 9.5 mg/dL (ref 8.6–10.2)
Chloride: 101 mmol/L (ref 96–106)
Creatinine, Ser: 1.69 mg/dL — ABNORMAL HIGH (ref 0.76–1.27)
Globulin, Total: 2.7 g/dL (ref 1.5–4.5)
Glucose: 108 mg/dL — ABNORMAL HIGH (ref 70–99)
Potassium: 4.5 mmol/L (ref 3.5–5.2)
Sodium: 137 mmol/L (ref 134–144)
Total Protein: 6.9 g/dL (ref 6.0–8.5)
eGFR: 44 mL/min/{1.73_m2} — ABNORMAL LOW (ref >59)

## 2021-03-16 LAB — CBC WITH DIFFERENTIAL/PLATELET
Basophils Absolute: 0 10*3/uL (ref 0.0–0.2)
Basos: 1 %
EOS (ABSOLUTE): 0.2 10*3/uL (ref 0.0–0.4)
Eos: 3 %
Hematocrit: 35.3 % — ABNORMAL LOW (ref 37.5–51.0)
Hemoglobin: 11.6 g/dL — ABNORMAL LOW (ref 13.0–17.7)
Immature Grans (Abs): 0 10*3/uL (ref 0.0–0.1)
Immature Granulocytes: 0 %
Lymphocytes Absolute: 0.8 10*3/uL (ref 0.7–3.1)
Lymphs: 14 %
MCH: 29.1 pg (ref 26.6–33.0)
MCHC: 32.9 g/dL (ref 31.5–35.7)
MCV: 89 fL (ref 79–97)
Monocytes Absolute: 0.6 10*3/uL (ref 0.1–0.9)
Monocytes: 11 %
Neutrophils Absolute: 4.1 10*3/uL (ref 1.4–7.0)
Neutrophils: 71 %
Platelets: 231 10*3/uL (ref 150–450)
RBC: 3.98 x10E6/uL — ABNORMAL LOW (ref 4.14–5.80)
RDW: 12.8 % (ref 11.6–15.4)
WBC: 5.6 10*3/uL (ref 3.4–10.8)

## 2021-03-17 NOTE — Progress Notes (Signed)
Hello Delrick,  Your lab result is normal and/or stable.Some minor variations that are not significant are commonly marked abnormal, but do not represent any medical problem for you.  Best regards, Emmanuelle Hibbitts, M.D.

## 2021-03-18 ENCOUNTER — Ambulatory Visit (INDEPENDENT_AMBULATORY_CARE_PROVIDER_SITE_OTHER): Payer: Medicare Other | Admitting: Family Medicine

## 2021-03-18 ENCOUNTER — Ambulatory Visit: Payer: Medicare Other | Admitting: Family Medicine

## 2021-03-18 ENCOUNTER — Encounter: Payer: Self-pay | Admitting: Family Medicine

## 2021-03-18 VITALS — BP 108/58 | HR 78 | Temp 97.3°F | Ht 76.0 in | Wt 180.4 lb

## 2021-03-18 DIAGNOSIS — Z23 Encounter for immunization: Secondary | ICD-10-CM | POA: Diagnosis not present

## 2021-03-18 DIAGNOSIS — N1832 Chronic kidney disease, stage 3b: Secondary | ICD-10-CM | POA: Diagnosis not present

## 2021-03-18 DIAGNOSIS — F411 Generalized anxiety disorder: Secondary | ICD-10-CM | POA: Diagnosis not present

## 2021-03-18 NOTE — Progress Notes (Signed)
Subjective:  Patient ID: Leslye Peer Hase, male    DOB: [birth year]  Age: 70 y.o. MRN: [id number]  CC: Follow-up   HPI Courage Biglow Cronkright presents for follow-up on chronic kidney disease.  He was seen for some urinary retention several days ago.  The report showed that his kidney function had declined somewhat.  He became very concerned and had the blood test repeated in preparation for today's visit.  He is worried that his kidneys are going to have on him.  Of note is that he has had stage IIIa kidney failure for quite some time.  This is based on estimated glomerular filtration rate not a true measurement.  Today is in for discussion of the results with his recent test.  He no longer is having the hesitancy or retention.  Every time he drinks water he goes to the bathroom now.  He also says that his cough has resolved that had been treated a couple of weeks ago  Depression screen [facility] 2/9 03/18/2021 03/11/2021 03/03/2021  Decreased Interest 0 0 0  Down, Depressed, Hopeless 0 0 0  PHQ - 2 Score 0 0 0  Altered sleeping - 0 1  Tired, decreased energy - 0 0  Change in appetite - 0 1  Feeling bad or failure about yourself  - 0 0  Trouble concentrating - 0 1  Moving slowly or fidgety/restless - 0 0  Suicidal thoughts - 0 0  PHQ-9 Score - 0 3  Difficult doing work/chores - Not difficult at all Not difficult at all  Some recent data might be hidden    History Graysin has a past medical history of Agoraphobia, Anemia, Anxiety, Arthritis, Asthma, Barrett esophagus, Blood transfusion without reported diagnosis, Bronchitis, Chronic kidney disease, Complication of anesthesia, Elevated serum creatinine, Esophageal stricture, GERD (gastroesophageal reflux disease), Hypertension, Mood disorder ([place]), Neck pain, chronic, Neuromuscular disorder ([place]), Rosacea, and Vitamin B12 deficiency.   He has a past surgical history that includes Chest tube insertion; Fracture surgery; Wisdom tooth extraction; Lumbar  laminectomy/decompression microdiscectomy (12/06/2011); Colonoscopy (08/20/2003); Upper gastrointestinal endoscopy (03/15/2013); and skin cancer removal from forehead.   His family history includes Diabetes in his mother; Heart disease in his mother; Stroke in his mother.He reports that he quit smoking about 36 years ago. His smoking use included cigarettes. He has a 0.90 pack-year smoking history. He has never used smokeless tobacco. He reports that he does not drink alcohol and does not use drugs.    ROS Review of Systems  Constitutional:  Negative for activity change, appetite change, chills, diaphoresis and fever.  HENT: Negative.    Respiratory:  Negative for cough.   Genitourinary:  Negative for difficulty urinating, flank pain, frequency and hematuria.   Objective:  BP (!) 108/58    Pulse 78    Temp (!) 97.3 F (36.3 C)    Ht 6' 4"  (1.93 m)    Wt 180 lb 6.4 oz (81.8 kg)    SpO2 100%    BMI 21.96 kg/m   BP Readings from Last 3 Encounters:  03/18/21 (!) 108/58  03/11/21 136/68  03/03/21 124/67    Wt Readings from Last 3 Encounters:  03/18/21 180 lb 6.4 oz (81.8 kg)  03/11/21 179 lb (81.2 kg)  03/03/21 173 lb (78.5 kg)     Physical Exam Vitals reviewed.  Constitutional:      Appearance: He is well-developed.  HENT:     Head: Normocephalic and atraumatic.     Right Ear: External  ear normal.     Left Ear: External ear normal.     Mouth/Throat:     Pharynx: No oropharyngeal exudate or posterior oropharyngeal erythema.  Eyes:     Pupils: Pupils are equal, round, and reactive to light.  Cardiovascular:     Rate and Rhythm: Normal rate and regular rhythm.     Heart sounds: No murmur heard. Pulmonary:     Effort: No respiratory distress.     Breath sounds: Normal breath sounds.  Musculoskeletal:     Cervical back: Normal range of motion and neck supple.  Neurological:     Mental Status: He is alert and oriented to person, place, and time.   Results for orders placed  or performed in visit on 03/16/21  CMP14+EGFR  Result Value Ref Range   Glucose 108 (H) 70 - 99 mg/dL   BUN 19 8 - 27 mg/dL   Creatinine, Ser 1.69 (H) 0.76 - 1.27 mg/dL   eGFR 44 (L) >59 mL/min/1.73   BUN/Creatinine Ratio 11 10 - 24   Sodium 137 134 - 144 mmol/L   Potassium 4.5 3.5 - 5.2 mmol/L   Chloride 101 96 - 106 mmol/L   CO2 24 20 - 29 mmol/L   Calcium 9.5 8.6 - 10.2 mg/dL   Total Protein 6.9 6.0 - 8.5 g/dL   Albumin 4.2 3.8 - 4.8 g/dL   Globulin, Total 2.7 1.5 - 4.5 g/dL   Albumin/Globulin Ratio 1.6 1.2 - 2.2   Bilirubin Total 0.4 0.0 - 1.2 mg/dL   Alkaline Phosphatase 138 (H) 44 - 121 IU/L   AST 26 0 - 40 IU/L   ALT 28 0 - 44 IU/L  CBC with Differential/Platelet  Result Value Ref Range   WBC 5.6 3.4 - 10.8 x10E3/uL   RBC 3.98 (L) 4.14 - 5.80 x10E6/uL   Hemoglobin 11.6 (L) 13.0 - 17.7 g/dL   Hematocrit 35.3 (L) 37.5 - 51.0 %   MCV 89 79 - 97 fL   MCH 29.1 26.6 - 33.0 pg   MCHC 32.9 31.5 - 35.7 g/dL   RDW 12.8 11.6 - 15.4 %   Platelets 231 150 - 450 x10E3/uL   Neutrophils 71 Not Estab. %   Lymphs 14 Not Estab. %   Monocytes 11 Not Estab. %   Eos 3 Not Estab. %   Basos 1 Not Estab. %   Neutrophils Absolute 4.1 1.4 - 7.0 x10E3/uL   Lymphocytes Absolute 0.8 0.7 - 3.1 x10E3/uL   Monocytes Absolute 0.6 0.1 - 0.9 x10E3/uL   EOS (ABSOLUTE) 0.2 0.0 - 0.4 x10E3/uL   Basophils Absolute 0.0 0.0 - 0.2 x10E3/uL   Immature Granulocytes 0 Not Estab. %   Immature Grans (Abs) 0.0 0.0 - 0.1 x10E3/uL      Assessment & Plan:   Ramari was seen today for follow-up.  Diagnoses and all orders for this visit:  Stage 3b chronic kidney disease ([place])  GAD (generalized anxiety disorder)       I am having Esias C. Jian maintain his diclofenac Sodium, metroNIDAZOLE, trihexyphenidyl, lidocaine, chlorproMAZINE, fluticasone, amLODipine, losartan, famotidine, esomeprazole, albuterol, clobetasol, Ativan, and carbamazepine.  Allergies as of 03/18/2021       Reactions    Acetaminophen Nausea And Vomiting   Amoxicillin    REACTION: unspecified/unknown   Cephalexin Swelling   In hands   Erythromycin    REACTION:Increases tegretol level   Nsaids Other (See Comments)   REACTION: Cannot take due to kidney health   Penicillins  REACTION: Unknown        Medication List        Accurate as of March 18, 2021  2:52 PM. If you have any questions, ask your nurse or doctor.          albuterol 108 (90 Base) MCG/ACT inhaler Commonly known as: VENTOLIN HFA Inhale 2 puffs into the lungs every 6 (six) hours as needed for wheezing or shortness of breath.   amLODipine 5 MG tablet Commonly known as: NORVASC TAKE 1 TABLET ONCE DAILY IN THE EVENING   Ativan 1 MG tablet Generic drug: LORazepam TAKE ONE TABLET FOUR TIMES DAILY.   carbamazepine 200 MG tablet Commonly known as: TEGretol TAKE (1/2) TABLET FOUR TIMES DAILY.   chlorproMAZINE 100 MG tablet Commonly known as: THORAZINE TAKE 1 TABLET 4 TIMES A DAY   clobetasol 0.05 % external solution Commonly known as: TEMOVATE APPLY TOPICALLY 2 TIMES A DAY   diclofenac Sodium 1 % Gel Commonly known as: Voltaren Apply 2 g topically 4 (four) times daily.   esomeprazole 40 MG capsule Commonly known as: NEXIUM TAKE (1) CAPSULE DAILY   famotidine 20 MG tablet Commonly known as: PEPCID Take 1 tablet (20 mg total) by mouth at bedtime.   fluticasone 50 MCG/ACT nasal spray Commonly known as: FLONASE 2 SPRAYS IN EACH NOSTRIL ONCE A DAY AS NEEDED   lidocaine 5 % Commonly known as: Lidoderm Place 1 patch onto the skin daily. Remove & Discard patch within 12 hours or as directed by MD   losartan 100 MG tablet Commonly known as: COZAAR TAKE (1) TABLET DAILY IN THE MORNING.   metroNIDAZOLE 0.75 % cream Commonly known as: METROCREAM Apply topically 2 (two) times daily.   trihexyphenidyl 2 MG tablet Commonly known as: ARTANE TAKE  (1)  TABLET TWICE A DAY WITH MEALS (BREAKFAST AND SUPPER)        His kidney function blood test evaluation from 2 days ago was reviewed with him.  It is listed above.  It was also compared with previous readings.  Of specific note was the estimated glomerular filtration rate.  It had been 36 2 weeks ago.  Today it is 63.  It has been approximately 45-47 on each check he has had for some time now.  Patient was reassured that he is stable that his kidney function although weaker than average is more than adequate to meet his body's needs at this time.  There is no reason to believe that it is coming get worse since its been stable overall for quite some time.  Follow-up: Return in about 3 months (around 06/16/2021).  Claretta Fraise, M.D.

## 2021-03-24 ENCOUNTER — Other Ambulatory Visit: Payer: Self-pay

## 2021-03-24 ENCOUNTER — Ambulatory Visit (INDEPENDENT_AMBULATORY_CARE_PROVIDER_SITE_OTHER): Payer: Medicare Other

## 2021-03-24 DIAGNOSIS — E538 Deficiency of other specified B group vitamins: Secondary | ICD-10-CM | POA: Diagnosis not present

## 2021-03-24 MED ORDER — CYANOCOBALAMIN 1000 MCG/ML IJ SOLN
1000.0000 ug | INTRAMUSCULAR | Status: AC
  Administered 2021-03-24 – 2022-03-07 (×12): 1000 ug via INTRAMUSCULAR

## 2021-03-24 NOTE — Progress Notes (Signed)
Cyanocobalamin injection given to right deltoid.  Patient tolerated well. 

## 2021-04-13 ENCOUNTER — Other Ambulatory Visit: Payer: Self-pay | Admitting: Psychiatry

## 2021-04-13 DIAGNOSIS — G2119 Other drug induced secondary parkinsonism: Secondary | ICD-10-CM

## 2021-04-13 DIAGNOSIS — F259 Schizoaffective disorder, unspecified: Secondary | ICD-10-CM

## 2021-04-15 ENCOUNTER — Telehealth: Payer: Self-pay | Admitting: *Deleted

## 2021-04-15 NOTE — Telephone Encounter (Signed)
Patient called the clinic today stating that he had splashed his face with an unidentified leaning solution yesterday. Complained that there was redness and watery eyes. I spoke to Dr. Livia Snellen and he said if there was alkaline product in cleaner then he would need to go to ED. Spoke to patient again and he said his sister said it was watered down dawn dish soap.  Advised patient to monitor. If vision starts to change go to ED or urgent care.

## 2021-04-16 ENCOUNTER — Other Ambulatory Visit: Payer: Self-pay | Admitting: Psychiatry

## 2021-04-16 DIAGNOSIS — F259 Schizoaffective disorder, unspecified: Secondary | ICD-10-CM

## 2021-04-23 ENCOUNTER — Encounter: Payer: Self-pay | Admitting: Nurse Practitioner

## 2021-04-23 ENCOUNTER — Ambulatory Visit (INDEPENDENT_AMBULATORY_CARE_PROVIDER_SITE_OTHER): Payer: Medicare Other | Admitting: Nurse Practitioner

## 2021-04-23 VITALS — BP 128/53 | HR 70 | Temp 98.2°F | Ht 76.0 in | Wt 177.1 lb

## 2021-04-23 DIAGNOSIS — H9201 Otalgia, right ear: Secondary | ICD-10-CM | POA: Diagnosis not present
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
## 2021-04-23 NOTE — Patient Instructions (Signed)
Earache, Adult An earache, or ear pain, can be caused by many things, including: An infection. Ear wax buildup. Ear pressure. Something in the ear that should not be there (foreign body). A sore throat. Tooth problems. Jaw problems. Treatment of the earache will depend on the cause. If the cause is not clear or cannot be determined, you may need to watch your symptoms until your earache goes away or until a cause is found. Follow these instructions at home: Medicines Take or apply over-the-counter and prescription medicines only as told by your health care provider. If you were prescribed an antibiotic medicine, use it as told by your health care provider. Do not stop using the antibiotic even if you start to feel better. Do not put anything in your ear other than medicine that is prescribed by your health care provider. Managing pain If directed, apply heat to the affected area as often as told by your health care provider. Use the heat source that your health care provider recommends, such as a moist heat pack or a heating pad. Place a towel between your skin and the heat source. Leave the heat on for 20-30 minutes. Remove the heat if your skin turns bright red. This is especially important if you are unable to feel pain, heat, or cold. You may have a greater risk of getting burned. If directed, put ice on the affected area as often as told by your health care provider. To do this:   Put ice in a plastic bag. Place a towel between your skin and the bag. Leave the ice on for 20 minutes, 2-3 times a day. General instructions Pay attention to any changes in your symptoms. Try resting in an upright position instead of lying down. This may help to reduce pressure in your ear and relieve pain. Chew gum if it helps to relieve your ear pain. Treat any allergies as told by your health care provider. Drink enough fluid to keep your urine pale yellow. It is up to you to get the results of any  tests that were done. Ask your health care provider, or the department that is doing the tests, when your results will be ready. Keep all follow-up visits as told by your health care provider. This is important. Contact a health care provider if: Your pain does not improve within 2 days. Your earache gets worse. You have new symptoms. You have a fever. Get help right away if you: Have a severe headache. Have a stiff neck. Have trouble swallowing. Have redness or swelling behind your ear. Have fluid or blood coming from your ear. Have hearing loss. Feel dizzy. Summary An earache, or ear pain, can be caused by many things. Treatment of the earache will depend on the cause. Follow recommendations from your health care provider to treat your ear pain. If the cause is not clear or cannot be determined, you may need to watch your symptoms until your earache goes away or until a cause is found. Keep all follow-up visits as told by your health care provider. This is important. This information is not intended to replace advice given to you by your health care provider. Make sure you discuss any questions you have with your health care provider. Document Revised: 10/20/2018 Document Reviewed: 10/20/2018 Elsevier Patient Education  2022 Elsevier Inc.  

## 2021-04-26 ENCOUNTER — Ambulatory Visit (INDEPENDENT_AMBULATORY_CARE_PROVIDER_SITE_OTHER): Payer: Medicare Other | Admitting: *Deleted

## 2021-04-26 DIAGNOSIS — E538 Deficiency of other specified B group vitamins: Secondary | ICD-10-CM

## 2021-04-26 NOTE — Progress Notes (Signed)
Pt given B12 injection IM left deltoid and tolerated well. °

## 2021-04-30 ENCOUNTER — Ambulatory Visit: Payer: Medicare Other | Admitting: Physician Assistant

## 2021-05-18 ENCOUNTER — Other Ambulatory Visit: Payer: Self-pay | Admitting: Psychiatry

## 2021-05-18 NOTE — Telephone Encounter (Signed)
Change ok.

## 2021-05-18 NOTE — Telephone Encounter (Signed)
Ok to substitute brand Ativan 2mg  1/2 tab QID for Ativan 1 mg QID as long as this is clearly explained to the pt.  He is easily confused.

## 2021-05-18 NOTE — Telephone Encounter (Signed)
Pharmacy is out of Ativan brand 1.0mg  and Matthew [name] is in need of a substitute. Can he get brand Ativan 2.0mg  - he would take 1/2 pill  four times per day. Send to [facility].

## 2021-05-19 ENCOUNTER — Ambulatory Visit: Payer: Self-pay | Admitting: Gastroenterology

## 2021-05-19 MED ORDER — LORAZEPAM 2 MG PO TABS: ORAL_TABLET | ORAL | 0 refills | Status: DC

## 2021-05-28 ENCOUNTER — Ambulatory Visit (INDEPENDENT_AMBULATORY_CARE_PROVIDER_SITE_OTHER): Payer: Medicare Other

## 2021-05-28 DIAGNOSIS — E538 Deficiency of other specified B group vitamins: Secondary | ICD-10-CM

## 2021-05-28 NOTE — Progress Notes (Signed)
Pt given b12Inj 1011mcg IM on the R deltoid ?Pt tol well ?

## 2021-06-07 ENCOUNTER — Telehealth: Payer: Self-pay | Admitting: Family Medicine

## 2021-06-07 DIAGNOSIS — N1832 Chronic kidney disease, stage 3b: Secondary | ICD-10-CM

## 2021-06-07 DIAGNOSIS — I10 Essential (primary) hypertension: Secondary | ICD-10-CM

## 2021-06-07 NOTE — Telephone Encounter (Signed)
Orders placed, patient aware

## 2021-06-08 ENCOUNTER — Other Ambulatory Visit: Payer: Medicare Other

## 2021-06-08 ENCOUNTER — Telehealth: Payer: Self-pay | Admitting: Family Medicine

## 2021-06-08 DIAGNOSIS — I10 Essential (primary) hypertension: Secondary | ICD-10-CM

## 2021-06-08 DIAGNOSIS — N1831 Chronic kidney disease, stage 3a: Secondary | ICD-10-CM

## 2021-06-08 DIAGNOSIS — N189 Chronic kidney disease, unspecified: Secondary | ICD-10-CM

## 2021-06-08 DIAGNOSIS — N1832 Chronic kidney disease, stage 3b: Secondary | ICD-10-CM

## 2021-06-08 DIAGNOSIS — F259 Schizoaffective disorder, unspecified: Secondary | ICD-10-CM

## 2021-06-08 NOTE — Telephone Encounter (Signed)
Patient came in today for lab work and when the results come back he needs them sent to [facility] in [place] [phone]) and [facility] in [place]. Please call patient with any questions.  ?

## 2021-06-09 LAB — CMP14+EGFR
ALT: 19 IU/L (ref 0–44)
AST: 19 IU/L (ref 0–40)
Albumin/Globulin Ratio: 1.3 (ref 1.2–2.2)
Albumin: 4.3 g/dL (ref 3.8–4.8)
Alkaline Phosphatase: 143 IU/L — ABNORMAL HIGH (ref 44–121)
BUN/Creatinine Ratio: 11 (ref 10–24)
BUN: 20 mg/dL (ref 8–27)
Bilirubin Total: 0.4 mg/dL (ref 0.0–1.2)
CO2: 23 mmol/L (ref 20–29)
Calcium: 9.6 mg/dL (ref 8.6–10.2)
Chloride: 101 mmol/L (ref 96–106)
Creatinine, Ser: 1.86 mg/dL — ABNORMAL HIGH (ref 0.76–1.27)
Globulin, Total: 3.2 g/dL (ref 1.5–4.5)
Glucose: 121 mg/dL — ABNORMAL HIGH (ref 70–99)
Potassium: 4.6 mmol/L (ref 3.5–5.2)
Sodium: 137 mmol/L (ref 134–144)
Total Protein: 7.5 g/dL (ref 6.0–8.5)
eGFR: 39 mL/min/{1.73_m2} — ABNORMAL LOW (ref >59)

## 2021-06-09 LAB — CBC WITH DIFFERENTIAL/PLATELET
Basophils Absolute: 0.1 10*3/uL (ref 0.0–0.2)
Basos: 1 %
EOS (ABSOLUTE): 0.2 10*3/uL (ref 0.0–0.4)
Eos: 3 %
Hematocrit: 37 % — ABNORMAL LOW (ref 37.5–51.0)
Hemoglobin: 12.2 g/dL — ABNORMAL LOW (ref 13.0–17.7)
Immature Grans (Abs): 0 10*3/uL (ref 0.0–0.1)
Immature Granulocytes: 0 %
Lymphocytes Absolute: 1.5 10*3/uL (ref 0.7–3.1)
Lymphs: 22 %
MCH: 29.7 pg (ref 26.6–33.0)
MCHC: 33 g/dL (ref 31.5–35.7)
MCV: 90 fL (ref 79–97)
Monocytes Absolute: 0.7 10*3/uL (ref 0.1–0.9)
Monocytes: 10 %
Neutrophils Absolute: 4.5 10*3/uL (ref 1.4–7.0)
Neutrophils: 64 %
Platelets: 205 10*3/uL (ref 150–450)
RBC: 4.11 x10E6/uL — ABNORMAL LOW (ref 4.14–5.80)
RDW: 12.3 % (ref 11.6–15.4)
WBC: 7 10*3/uL (ref 3.4–10.8)

## 2021-06-09 LAB — MICROALBUMIN / CREATININE URINE RATIO
Creatinine, Urine: 31.1 mg/dL
Microalb/Creat Ratio: <10 mg/g creat (ref 0–29)
Microalbumin, Urine: <3 ug/mL

## 2021-06-09 LAB — LIPID PANEL
Chol/HDL Ratio: 2.9 ratio (ref 0.0–5.0)
Cholesterol, Total: 189 mg/dL (ref 100–199)
HDL: 65 mg/dL (ref >39)
LDL Chol Calc (NIH): 103 mg/dL — ABNORMAL HIGH (ref 0–99)
Triglycerides: 122 mg/dL (ref 0–149)
VLDL Cholesterol Cal: 21 mg/dL (ref 5–40)

## 2021-06-09 LAB — CARBAMAZEPINE LEVEL, TOTAL: Carbamazepine (Tegretol), S: 7.1 ug/mL (ref 4.0–12.0)

## 2021-06-09 NOTE — Progress Notes (Signed)
Hello Syler, ? ?Your lab result is normal and/or stable.Kidney function is moderately weak, but is stable. ?Best regards, ?Claretta Fraise, M.D.

## 2021-06-15 ENCOUNTER — Ambulatory Visit (INDEPENDENT_AMBULATORY_CARE_PROVIDER_SITE_OTHER): Payer: Medicare Other | Admitting: Family Medicine

## 2021-06-15 ENCOUNTER — Encounter: Payer: Self-pay | Admitting: Family Medicine

## 2021-06-15 VITALS — BP 130/60 | HR 79 | Temp 96.7°F | Ht 76.0 in | Wt 181.8 lb

## 2021-06-15 DIAGNOSIS — I10 Essential (primary) hypertension: Secondary | ICD-10-CM

## 2021-06-15 DIAGNOSIS — Z13220 Encounter for screening for lipoid disorders: Secondary | ICD-10-CM

## 2021-06-15 DIAGNOSIS — F259 Schizoaffective disorder, unspecified: Secondary | ICD-10-CM | POA: Diagnosis not present

## 2021-06-15 DIAGNOSIS — N1832 Chronic kidney disease, stage 3b: Secondary | ICD-10-CM | POA: Diagnosis not present

## 2021-06-15 DIAGNOSIS — K2100 Gastro-esophageal reflux disease with esophagitis, without bleeding: Secondary | ICD-10-CM

## 2021-06-15 DIAGNOSIS — Z23 Encounter for immunization: Secondary | ICD-10-CM

## 2021-06-15 NOTE — Progress Notes (Signed)
? ?Subjective:  ?Patient ID: Matthew [name], male    DOB: 03/02/1951  Age: 71 y.o. MRN: [id number] ? ?CC: Medical Management of Chronic Issues ? ? ?HPI ?Matthew [name] presents for palpitations every once in a while. LAsts a few seconds. Once every 2-4 weeks. None lately. No chest No dyspnea. No dizziness. No other symptoms associated. Says Matthew [name] ate crackers befor his blood test last week.  ? ?Depression screen PHQ 2/9 06/15/2021 06/15/2021 03/18/2021  ?Decreased Interest 1 0 0  ?Down, Depressed, Hopeless 0 0 0  ?PHQ - 2 Score 1 0 0  ?Altered sleeping 3 - -  ?Tired, decreased energy 0 - -  ?Change in appetite 0 - -  ?Feeling bad or failure about yourself  0 - -  ?Trouble concentrating 3 - -  ?Moving slowly or fidgety/restless 0 - -  ?Suicidal thoughts 0 - -  ?PHQ-9 Score 7 - -  ?Difficult doing work/chores Not difficult at all - -  ?Some recent data might be hidden  ? ? ?History ?Matthew [name] has a past medical history of Agoraphobia, Anemia, Anxiety, Arthritis, Asthma, Barrett esophagus, Blood transfusion without reported diagnosis, Bronchitis, Chronic kidney disease, Complication of anesthesia, Elevated serum creatinine, Esophageal stricture, GERD (gastroesophageal reflux disease), Hypertension, Mood disorder (HCC), Neck pain, chronic, Neuromuscular disorder (HCC), Rosacea, and Vitamin B12 deficiency.  ? ?Matthew [name] has a past surgical history that includes Chest tube insertion; Fracture surgery; Wisdom tooth extraction; Lumbar laminectomy/decompression microdiscectomy (12/06/2011); Colonoscopy (08/20/2003); Upper gastrointestinal endoscopy (03/15/2013); and skin cancer removal from forehead.  ? ?His family history includes Diabetes in his mother; Heart disease in his mother; Stroke in his mother.Matthew [name] reports that Matthew [name] quit smoking about 36 years ago. His smoking use included cigarettes. Matthew [name] has a 0.90 pack-year smoking history. Matthew [name] has never used smokeless tobacco. Matthew [name] reports that Matthew [name] does not drink alcohol and does not use  drugs. ? ? ? ?ROS ?Review of Systems  ?Constitutional:  Negative for fever.  ?Respiratory:  Negative for shortness of breath.   ?Cardiovascular:  Negative for chest pain.  ?Musculoskeletal:  Negative for arthralgias.  ?Skin:  Negative for rash.  ? ?Objective:  ?BP 130/60   Pulse 79   Temp (!) 96.7 ?F (35.9 ?C)   Ht 6' 4" (1.93 m)   Wt 181 lb 12.8 oz (82.5 kg)   SpO2 100%   BMI 22.13 kg/m?  ? ?BP Readings from Last 3 Encounters:  ?06/15/21 130/60  ?04/23/21 (!) 128/53  ?03/18/21 (!) 108/58  ? ? ?Wt Readings from Last 3 Encounters:  ?06/15/21 181 lb 12.8 oz (82.5 kg)  ?04/23/21 177 lb 2 oz (80.3 kg)  ?03/18/21 180 lb 6.4 oz (81.8 kg)  ? ? ? ?Physical Exam ?Vitals reviewed.  ?Constitutional:   ?   Appearance: Matthew [name] is well-developed.  ?HENT:  ?   Head: Normocephalic and atraumatic.  ?   Right Ear: External ear normal.  ?   Left Ear: External ear normal.  ?   Mouth/Throat:  ?   Pharynx: No oropharyngeal exudate or posterior oropharyngeal erythema.  ?Eyes:  ?   Pupils: Pupils are equal, round, and reactive to light.  ?Cardiovascular:  ?   Rate and Rhythm: Normal rate and regular rhythm.  ?   Heart sounds: No murmur heard. ?Pulmonary:  ?   Effort: No respiratory distress.  ?   Breath sounds: Normal breath sounds.  ?Musculoskeletal:  ?   Cervical back: Normal range of motion and neck supple.  ?Neurological:  ?     Mental Status: Matthew [name] is alert and oriented to person, place, and time.  ? ? ? ? ?Assessment & Plan:  ? ?Matthew [name] was seen today for medical management of chronic issues. ? ?Diagnoses and all orders for this visit: ? ?Essential hypertension ?-     Cancel: CBC with Differential/Platelet ?-     Cancel: CMP14+EGFR ? ?Stage 3b chronic kidney disease (HCC) ? ?Lipid screening ?-     Cancel: Lipid panel ? ?Schizoaffective disorder, unspecified type (HCC) ? ?Gastroesophageal reflux disease with esophagitis without hemorrhage ? ? ? ? ? ? ?I have discontinued Matthew [name] doxycycline. I am also having him maintain his  diclofenac Sodium, fluticasone, amLODipine, losartan, famotidine, esomeprazole, albuterol, clobetasol, Ativan, carbamazepine, chlorproMAZINE, trihexyphenidyl, and LORazepam. We will continue to administer cyanocobalamin. ? ?Allergies as of 06/15/2021   ? ?   Reactions  ? Acetaminophen Nausea And Vomiting  ? Amoxicillin   ? REACTION: unspecified/unknown  ? Cephalexin Swelling  ? In hands  ? Erythromycin   ? REACTION:Increases tegretol level  ? Nsaids Other (See Comments)  ? REACTION: Cannot take due to kidney health  ? Penicillins   ? REACTION: Unknown  ? ?  ? ?  ?Medication List  ?  ? ?  ? Accurate as of June 15, 2021  1:28 PM. If you have any questions, ask your nurse or doctor.  ?  ?  ? ?  ? ?STOP taking these medications   ? ?doxycycline 100 MG tablet ?Commonly known as: VIBRA-TABS ?Stopped by:  , MD ?  ? ?  ? ?TAKE these medications   ? ?albuterol 108 (90 Base) MCG/ACT inhaler ?Commonly known as: VENTOLIN HFA ?Inhale 2 puffs into the lungs every 6 (six) hours as needed for wheezing or shortness of breath. ?  ?amLODipine 5 MG tablet ?Commonly known as: NORVASC ?TAKE 1 TABLET ONCE DAILY IN THE EVENING ?  ?Ativan 1 MG tablet ?Generic drug: LORazepam ?TAKE ONE TABLET FOUR TIMES DAILY. ?  ?LORazepam 2 MG tablet ?Commonly known as: Ativan ?Take 1/2 tabs 4 times daily ?  ?carbamazepine 200 MG tablet ?Commonly known as: TEGretol ?TAKE (1/2) TABLET FOUR TIMES DAILY. ?  ?chlorproMAZINE 100 MG tablet ?Commonly known as: THORAZINE ?TAKE 1 TABLET 4 TIMES A DAY ?  ?clobetasol 0.05 % external solution ?Commonly known as: TEMOVATE ?APPLY TOPICALLY 2 TIMES A DAY ?  ?diclofenac Sodium 1 % Gel ?Commonly known as: Voltaren ?Apply 2 g topically 4 (four) times daily. ?  ?esomeprazole 40 MG capsule ?Commonly known as: NEXIUM ?TAKE (1) CAPSULE DAILY ?  ?famotidine 20 MG tablet ?Commonly known as: PEPCID ?Take 1 tablet (20 mg total) by mouth at bedtime. ?  ?fluticasone 50 MCG/ACT nasal spray ?Commonly known as: FLONASE ?2  SPRAYS IN EACH NOSTRIL ONCE A DAY AS NEEDED ?  ?losartan 100 MG tablet ?Commonly known as: COZAAR ?TAKE (1) TABLET DAILY IN THE MORNING. ?  ?trihexyphenidyl 2 MG tablet ?Commonly known as: ARTANE ?TAKE (1) TABLET TWICE A DAY WITH MEALS (BREAKFAST AND SUPPER) ?  ? ?  ? ? ? ?Follow-up: Return in about 3 months (around 09/15/2021), or 4. ? ? , M.D. ?

## 2021-06-15 NOTE — Addendum Note (Signed)
Addended by: Christia Reading on: 06/15/2021 04:23 PM ? ? Modules accepted: Orders ? ?

## 2021-06-28 ENCOUNTER — Ambulatory Visit (INDEPENDENT_AMBULATORY_CARE_PROVIDER_SITE_OTHER): Payer: Medicare Other | Admitting: *Deleted

## 2021-06-28 DIAGNOSIS — E538 Deficiency of other specified B group vitamins: Secondary | ICD-10-CM

## 2021-06-28 NOTE — Progress Notes (Signed)
Vitamin b12 injection given and patient tolerated well.  

## 2021-06-29 ENCOUNTER — Ambulatory Visit: Payer: Medicare Other

## 2021-07-01 ENCOUNTER — Telehealth: Payer: Self-pay | Admitting: Psychiatry
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
## 2021-07-05 ENCOUNTER — Encounter: Payer: Self-pay | Admitting: Psychiatry

## 2021-07-05 ENCOUNTER — Ambulatory Visit (INDEPENDENT_AMBULATORY_CARE_PROVIDER_SITE_OTHER): Payer: Medicare Other | Admitting: Psychiatry

## 2021-07-05 DIAGNOSIS — Z87820 Personal history of traumatic brain injury: Secondary | ICD-10-CM

## 2021-07-05 DIAGNOSIS — G3184 Mild cognitive impairment, so stated: Secondary | ICD-10-CM | POA: Diagnosis not present

## 2021-07-05 DIAGNOSIS — F4001 Agoraphobia with panic disorder: Secondary | ICD-10-CM

## 2021-07-05 DIAGNOSIS — F411 Generalized anxiety disorder: Secondary | ICD-10-CM | POA: Diagnosis not present

## 2021-07-05 DIAGNOSIS — F259 Schizoaffective disorder, unspecified: Secondary | ICD-10-CM | POA: Diagnosis not present
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
## 2021-07-28 ENCOUNTER — Ambulatory Visit (INDEPENDENT_AMBULATORY_CARE_PROVIDER_SITE_OTHER): Payer: Medicare Other | Admitting: *Deleted

## 2021-07-28 DIAGNOSIS — E538 Deficiency of other specified B group vitamins: Secondary | ICD-10-CM | POA: Diagnosis not present

## 2021-08-09 ENCOUNTER — Telehealth: Payer: Self-pay | Admitting: Psychiatry

## 2021-08-09 NOTE — Telephone Encounter (Signed)
He's going to call if I have to send in the loraZepam as generic.  You will need to talk to him about this ahead of time.  Then I will ?

## 2021-08-09 NOTE — Telephone Encounter (Signed)
Patient has called multiple times today. I called the pharmacy to clarify the situation and patient has called them 4 times today. The pharmacy only has 10 days of brand Ativan in stock and currently is unable to get more from their supplier. The pharmacy has told the patient that they will save the brand for him for those 10 days, but then he will have to switch to generic for a couple of months until the pharmacy is able to get brand again. From what the pharmacist told me they are filling more controlled medications in relation to total medications so their ability to get more has been reduced. Patient does not want to try to locate at a different pharmacy. Patient told me that you said he could get 5 mg total a day if he has to use generic versus the 4 mg total a day he got with brand. The pharmacy said they need a new Rx for refills for generic starting 5/27 because brand is DAW 1. Patient will get the last brand filled on 5/17.  ? ? ? 07/12/2021  ?Ativan 1 Mg Tablet ?120.00 30 Ca Cot ? 06/12/2021  ?Ativan 1 Mg Tablet ?120.00 30 Ca Cot ? ?

## 2021-08-09 NOTE — Telephone Encounter (Signed)
Pt called at 2:57 pm and said that pharmacy can't get the brand name of ativan in stock. They have plenty of the generic in stock. So please send in a new script of the generic ativan to the [facility] ?

## 2021-08-10 ENCOUNTER — Encounter: Payer: Self-pay | Admitting: Family Medicine

## 2021-08-10 ENCOUNTER — Ambulatory Visit (INDEPENDENT_AMBULATORY_CARE_PROVIDER_SITE_OTHER): Payer: Medicare Other | Admitting: Family Medicine

## 2021-08-10 ENCOUNTER — Other Ambulatory Visit: Payer: Self-pay | Admitting: Psychiatry

## 2021-08-10 DIAGNOSIS — R34 Anuria and oliguria: Secondary | ICD-10-CM

## 2021-08-10 LAB — URINALYSIS, ROUTINE W REFLEX MICROSCOPIC
Bilirubin, UA: NEGATIVE
Glucose, UA: NEGATIVE
Ketones, UA: NEGATIVE
Leukocytes,UA: NEGATIVE
Nitrite, UA: NEGATIVE
Protein,UA: NEGATIVE
RBC, UA: NEGATIVE
Specific Gravity, UA: 1.01 (ref 1.005–1.030)
Urobilinogen, Ur: 1 mg/dL (ref 0.2–1.0)
pH, UA: 6.5 (ref 5.0–7.5)

## 2021-08-10 MED ORDER — LORAZEPAM 1 MG PO TABS: ORAL_TABLET | ORAL | 0 refills | Status: DC

## 2021-08-10 NOTE — Telephone Encounter (Signed)
Patient has been on branded Ativan for years and fears transition to generic because it has been less effective in the past.  However the brand is not currently available at his pharmacy.  We will give lorazepam 1 mg 4 times daily as usual and 1 extra tablet if needed for panic due to the potential for the reduced efficacy from the generic. ? ?Prescription sent ?

## 2021-08-10 NOTE — Progress Notes (Signed)
Patient has been on branded Ativan for years and fears transition to generic because it has been less effective in the past.  However the brand is not currently available at his pharmacy.  We will give lorazepam 1 mg 4 times daily as usual and 1 extra tablet if needed for panic due to the potential for the reduced efficacy from the generic. ? ?Prescription sent ?
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
## 2021-08-11 LAB — CBC WITH DIFFERENTIAL/PLATELET
Basophils Absolute: 0.1 10*3/uL (ref 0.0–0.2)
Basos: 1 %
EOS (ABSOLUTE): 0.2 10*3/uL (ref 0.0–0.4)
Eos: 2 %
Hematocrit: 34.9 % — ABNORMAL LOW (ref 37.5–51.0)
Hemoglobin: 11.6 g/dL — ABNORMAL LOW (ref 13.0–17.7)
Immature Grans (Abs): 0 10*3/uL (ref 0.0–0.1)
Immature Granulocytes: 0 %
Lymphocytes Absolute: 1.2 10*3/uL (ref 0.7–3.1)
Lymphs: 19 %
MCH: 29.9 pg (ref 26.6–33.0)
MCHC: 33.2 g/dL (ref 31.5–35.7)
MCV: 90 fL (ref 79–97)
Monocytes Absolute: 0.5 10*3/uL (ref 0.1–0.9)
Monocytes: 8 %
Neutrophils Absolute: 4.3 10*3/uL (ref 1.4–7.0)
Neutrophils: 70 %
Platelets: 229 10*3/uL (ref 150–450)
RBC: 3.88 x10E6/uL — ABNORMAL LOW (ref 4.14–5.80)
RDW: 12.7 % (ref 11.6–15.4)
WBC: 6.2 10*3/uL (ref 3.4–10.8)

## 2021-08-11 LAB — BMP8+EGFR
BUN/Creatinine Ratio: 16 (ref 10–24)
BUN: 31 mg/dL — ABNORMAL HIGH (ref 8–27)
CO2: 23 mmol/L (ref 20–29)
Calcium: 9.2 mg/dL (ref 8.6–10.2)
Chloride: 101 mmol/L (ref 96–106)
Creatinine, Ser: 1.91 mg/dL — ABNORMAL HIGH (ref 0.76–1.27)
Glucose: 138 mg/dL — ABNORMAL HIGH (ref 70–99)
Potassium: 4.7 mmol/L (ref 3.5–5.2)
Sodium: 136 mmol/L (ref 134–144)
eGFR: 38 mL/min/{1.73_m2} — ABNORMAL LOW (ref >59)

## 2021-08-12 ENCOUNTER — Telehealth: Payer: Self-pay | Admitting: Family Medicine

## 2021-08-12 NOTE — Telephone Encounter (Signed)
Discussed with patient, about lab results and kidney function. Patient was concerned about levels. I advised patient to maintain adquate hydration and watch diet. He states that his nephew is helping him with food choices and reading labels to decrease sugar in his diet.   I advised patient to keep followup appointments with Dr. Livia Snellen and we would repeat labs and monitor disease management at next visit.   Patient verbalized understanding   [name]. LPN

## 2021-08-18 ENCOUNTER — Telehealth: Payer: Self-pay | Admitting: Family Medicine

## 2021-08-19 ENCOUNTER — Ambulatory Visit (INDEPENDENT_AMBULATORY_CARE_PROVIDER_SITE_OTHER): Payer: Medicare Other | Admitting: Family Medicine

## 2021-08-19 ENCOUNTER — Encounter: Payer: Self-pay | Admitting: Family Medicine

## 2021-08-19 VITALS — BP 120/63 | HR 85 | Temp 97.1°F | Ht 76.0 in | Wt 169.8 lb

## 2021-08-19 DIAGNOSIS — R7309 Other abnormal glucose: Secondary | ICD-10-CM | POA: Diagnosis not present

## 2021-08-19 DIAGNOSIS — N1832 Chronic kidney disease, stage 3b: Secondary | ICD-10-CM

## 2021-08-19 DIAGNOSIS — E782 Mixed hyperlipidemia: Secondary | ICD-10-CM

## 2021-08-19 DIAGNOSIS — R634 Abnormal weight loss: Secondary | ICD-10-CM

## 2021-08-19 DIAGNOSIS — E559 Vitamin D deficiency, unspecified: Secondary | ICD-10-CM

## 2021-08-19 DIAGNOSIS — Z125 Encounter for screening for malignant neoplasm of prostate: Secondary | ICD-10-CM

## 2021-08-19 LAB — BAYER DCA HB A1C WAIVED: HB A1C (BAYER DCA - WAIVED): 5.4 % (ref 4.8–5.6)

## 2021-08-19 NOTE — Patient Instructions (Addendum)
Increase diet to 2000-2200 calories a day. You are underweight.

## 2021-08-19 NOTE — Progress Notes (Signed)
Subjective:  Patient ID: Matthew [name], male    DOB: 20-Jan-1951  Age: 71 y.o. MRN: [id number]  CC: Weight Loss   HPI Matthew Peer Hord presents for Weight loss of 12 lb over 2 mos. he is frustrated that his nephew is regulating his diet.  He is holding him to 1600 cal or less.  He wants him having sweets including his ice cream.  He lets him have an ice cream treat about once a week if he does not have any ice cream in the meantime.  This is causing him to lose weight.  He was already underweight.  He has good appetite.  He has no medical symptoms referable to weight loss problems including cancers TB etc.  However he would like to be checked for causes of weight loss.     08/19/2021   11:15 AM 06/15/2021    1:05 PM 06/15/2021   12:54 PM  Depression screen PHQ 2/9  Decreased Interest 0 1 0  Down, Depressed, Hopeless 0 0 0  PHQ - 2 Score 0 1 0  Altered sleeping 2 3   Tired, decreased energy 0 0   Change in appetite 0 0   Feeling bad or failure about yourself  0 0   Trouble concentrating 0 3   Moving slowly or fidgety/restless 0 0   Suicidal thoughts 0 0   PHQ-9 Score 2 7   Difficult doing work/chores Not difficult at all Not difficult at all     History Malik has a past medical history of Agoraphobia, Anemia, Anxiety, Arthritis, Asthma, Barrett esophagus, Blood transfusion without reported diagnosis, Bronchitis, Chronic kidney disease, Complication of anesthesia, Elevated serum creatinine, Esophageal stricture, GERD (gastroesophageal reflux disease), Hypertension, Mood disorder ([place]), Neck pain, chronic, Neuromuscular disorder ([place]), Rosacea, and Vitamin B12 deficiency.   He has a past surgical history that includes Chest tube insertion; Fracture surgery; Wisdom tooth extraction; Lumbar laminectomy/decompression microdiscectomy (12/06/2011); Colonoscopy (08/20/2003); Upper gastrointestinal endoscopy (03/15/2013); and skin cancer removal from forehead.   His family history includes  Diabetes in his mother; Heart disease in his mother; Stroke in his mother.He reports that he quit smoking about 36 years ago. His smoking use included cigarettes. He has a 0.90 pack-year smoking history. He has never used smokeless tobacco. He reports that he does not drink alcohol and does not use drugs.    ROS Review of Systems  Constitutional: Negative.   HENT: Negative.    Eyes:  Negative for visual disturbance.  Respiratory:  Negative for cough and shortness of breath.   Cardiovascular:  Negative for chest pain and leg swelling.  Gastrointestinal:  Negative for abdominal pain, diarrhea, nausea and vomiting.  Genitourinary:  Negative for difficulty urinating.  Musculoskeletal:  Negative for arthralgias and myalgias.  Skin:  Negative for rash.  Neurological:  Negative for headaches.  Psychiatric/Behavioral:  Negative for sleep disturbance.    Objective:  BP 120/63   Pulse 85   Temp (!) 97.1 F (36.2 C)   Ht 6' 4"  (1.93 m)   Wt 169 lb 12.8 oz (77 kg)   SpO2 100%   BMI 20.67 kg/m   BP Readings from Last 3 Encounters:  08/19/21 120/63  06/15/21 130/60  04/23/21 (!) 128/53    Wt Readings from Last 3 Encounters:  08/19/21 169 lb 12.8 oz (77 kg)  06/15/21 181 lb 12.8 oz (82.5 kg)  04/23/21 177 lb 2 oz (80.3 kg)     Physical Exam Vitals reviewed.  Constitutional:  Appearance: He is well-developed.  HENT:     Head: Normocephalic and atraumatic.     Right Ear: External ear normal.     Left Ear: External ear normal.     Mouth/Throat:     Pharynx: No oropharyngeal exudate or posterior oropharyngeal erythema.  Eyes:     Pupils: Pupils are equal, round, and reactive to light.  Cardiovascular:     Rate and Rhythm: Normal rate and regular rhythm.     Heart sounds: No murmur heard. Pulmonary:     Effort: No respiratory distress.     Breath sounds: Normal breath sounds.  Abdominal:     General: There is no distension.     Palpations: There is no mass.      Tenderness: There is no abdominal tenderness.  Musculoskeletal:     Cervical back: Normal range of motion and neck supple.  Neurological:     Mental Status: He is alert and oriented to person, place, and time.      Assessment & Plan:   Jaze was seen today for weight loss.  Diagnoses and all orders for this visit:  Stage 3b chronic kidney disease ([place]) -     CBC with Differential/Platelet -     CMP14+EGFR  Elevated glucose -     CBC with Differential/Platelet -     CMP14+EGFR -     Bayer DCA Hb A1c Waived  Weight loss -     TSH + free T4 -     Cancel: QuantiFERON-TB Gold Plus -     QFT-TB Plus (Client Incubated)  Screening for prostate cancer  Vitamin D deficiency -     VITAMIN D 25 Hydroxy (Vit-D Deficiency, Fractures)  Mixed hyperlipidemia -     Lipid panel       I am having Jeromy C. Gougeon maintain his diclofenac Sodium, fluticasone, amLODipine, losartan, famotidine, esomeprazole, clobetasol, Ativan, carbamazepine, chlorproMAZINE, trihexyphenidyl, and LORazepam. We will continue to administer cyanocobalamin.  Allergies as of 08/19/2021       Reactions   Acetaminophen Nausea And Vomiting   Amoxicillin    REACTION: unspecified/unknown   Cephalexin Swelling   In hands   Erythromycin    REACTION:Increases tegretol level   Nsaids Other (See Comments)   REACTION: Cannot take due to kidney health   Penicillins    REACTION: Unknown        Medication List        Accurate as of Aug 19, 2021  5:21 PM. If you have any questions, ask your nurse or doctor.          amLODipine 5 MG tablet Commonly known as: NORVASC TAKE 1 TABLET ONCE DAILY IN THE EVENING   Ativan 1 MG tablet Generic drug: LORazepam TAKE ONE TABLET FOUR TIMES DAILY.   LORazepam 1 MG tablet Commonly known as: ATIVAN 1 tablet 4 times daily and if needed for panic 1 extra tablet daily   carbamazepine 200 MG tablet Commonly known as: TEGretol TAKE (1/2) TABLET FOUR TIMES DAILY.    chlorproMAZINE 100 MG tablet Commonly known as: THORAZINE TAKE 1 TABLET 4 TIMES A DAY   clobetasol 0.05 % external solution Commonly known as: TEMOVATE APPLY TOPICALLY 2 TIMES A DAY   diclofenac Sodium 1 % Gel Commonly known as: Voltaren Apply 2 g topically 4 (four) times daily. What changed:  when to take this reasons to take this   esomeprazole 40 MG capsule Commonly known as: NEXIUM TAKE (1) CAPSULE DAILY   famotidine  20 MG tablet Commonly known as: PEPCID Take 1 tablet (20 mg total) by mouth at bedtime.   fluticasone 50 MCG/ACT nasal spray Commonly known as: FLONASE 2 SPRAYS IN EACH NOSTRIL ONCE A DAY AS NEEDED   losartan 100 MG tablet Commonly known as: COZAAR TAKE (1) TABLET DAILY IN THE MORNING.   trihexyphenidyl 2 MG tablet Commonly known as: ARTANE TAKE (1) TABLET TWICE A DAY WITH MEALS (BREAKFAST AND SUPPER)       I recommended that he should have at least 2000 cal a day.  He does not need any limits on his diet since he is 6 foot 4 and underweight already.  I encouraged him to have a discussion with his nephew.  He says his nephew is a great cook but he just does not let him use much as he wants.  I gave him an AVS with the note attached stating that he should have a minimum of 2000 cal daily.  Hopefully that will influence his family members.  Follow-up: Return if symptoms worsen or fail to improve.  Claretta Fraise, M.D.

## 2021-08-20 LAB — CBC WITH DIFFERENTIAL/PLATELET
Basophils Absolute: 0 10*3/uL (ref 0.0–0.2)
Basos: 1 %
EOS (ABSOLUTE): 0.1 10*3/uL (ref 0.0–0.4)
Eos: 2 %
Hematocrit: 35 % — ABNORMAL LOW (ref 37.5–51.0)
Hemoglobin: 11.8 g/dL — ABNORMAL LOW (ref 13.0–17.7)
Immature Grans (Abs): 0 10*3/uL (ref 0.0–0.1)
Immature Granulocytes: 0 %
Lymphocytes Absolute: 0.9 10*3/uL (ref 0.7–3.1)
Lymphs: 16 %
MCH: 30.5 pg (ref 26.6–33.0)
MCHC: 33.7 g/dL (ref 31.5–35.7)
MCV: 90 fL (ref 79–97)
Monocytes Absolute: 0.5 10*3/uL (ref 0.1–0.9)
Monocytes: 8 %
Neutrophils Absolute: 4.1 10*3/uL (ref 1.4–7.0)
Neutrophils: 73 %
Platelets: 209 10*3/uL (ref 150–450)
RBC: 3.87 x10E6/uL — ABNORMAL LOW (ref 4.14–5.80)
RDW: 12.9 % (ref 11.6–15.4)
WBC: 5.6 10*3/uL (ref 3.4–10.8)

## 2021-08-20 LAB — CMP14+EGFR
ALT: 23 IU/L (ref 0–44)
AST: 22 IU/L (ref 0–40)
Albumin/Globulin Ratio: 1.5 (ref 1.2–2.2)
Albumin: 4.4 g/dL (ref 3.8–4.8)
Alkaline Phosphatase: 142 IU/L — ABNORMAL HIGH (ref 44–121)
BUN/Creatinine Ratio: 18 (ref 10–24)
BUN: 29 mg/dL — ABNORMAL HIGH (ref 8–27)
Bilirubin Total: 0.3 mg/dL (ref 0.0–1.2)
CO2: 22 mmol/L (ref 20–29)
Calcium: 9.7 mg/dL (ref 8.6–10.2)
Chloride: 104 mmol/L (ref 96–106)
Creatinine, Ser: 1.63 mg/dL — ABNORMAL HIGH (ref 0.76–1.27)
Globulin, Total: 2.9 g/dL (ref 1.5–4.5)
Glucose: 94 mg/dL (ref 70–99)
Potassium: 4.4 mmol/L (ref 3.5–5.2)
Sodium: 140 mmol/L (ref 134–144)
Total Protein: 7.3 g/dL (ref 6.0–8.5)
eGFR: 46 mL/min/{1.73_m2} — ABNORMAL LOW (ref >59)

## 2021-08-20 LAB — LIPID PANEL
Chol/HDL Ratio: 3.1 ratio (ref 0.0–5.0)
Cholesterol, Total: 190 mg/dL (ref 100–199)
HDL: 61 mg/dL (ref >39)
LDL Chol Calc (NIH): 114 mg/dL — ABNORMAL HIGH (ref 0–99)
Triglycerides: 85 mg/dL (ref 0–149)
VLDL Cholesterol Cal: 15 mg/dL (ref 5–40)

## 2021-08-20 LAB — TSH+FREE T4
Free T4: 1.02 ng/dL (ref 0.82–1.77)
TSH: 2.4 u[IU]/mL (ref 0.450–4.500)

## 2021-08-20 LAB — VITAMIN D 25 HYDROXY (VIT D DEFICIENCY, FRACTURES): Vit D, 25-Hydroxy: 33 ng/mL (ref 30.0–100.0)

## 2021-08-24 ENCOUNTER — Telehealth: Payer: Self-pay | Admitting: Family Medicine

## 2021-08-24 NOTE — Telephone Encounter (Signed)
Everything came out normal.  The exception was that he had some weakness in his kidney in the past.  This was actually somewhat improved although not completely back to normal.

## 2021-08-25 LAB — QUANTIFERON-TB GOLD PLUS

## 2021-08-30 ENCOUNTER — Ambulatory Visit (INDEPENDENT_AMBULATORY_CARE_PROVIDER_SITE_OTHER): Payer: Medicare Other | Admitting: Emergency Medicine

## 2021-08-30 DIAGNOSIS — E538 Deficiency of other specified B group vitamins: Secondary | ICD-10-CM

## 2021-08-30 NOTE — Progress Notes (Signed)
Patient presents for B12 injection. Injection given in Left Deltoid. Patient tolerated well   Doloris Hall, RN

## 2021-09-09 ENCOUNTER — Other Ambulatory Visit: Payer: Self-pay | Admitting: Psychiatry

## 2021-09-27 ENCOUNTER — Other Ambulatory Visit: Payer: Self-pay | Admitting: Psychiatry

## 2021-09-27 ENCOUNTER — Telehealth: Payer: Self-pay | Admitting: Psychiatry

## 2021-09-27 DIAGNOSIS — F4001 Agoraphobia with panic disorder: Secondary | ICD-10-CM

## 2021-09-27 DIAGNOSIS — F411 Generalized anxiety disorder: Secondary | ICD-10-CM

## 2021-09-27 NOTE — Telephone Encounter (Signed)
Please advise when you return

## 2021-09-27 NOTE — Telephone Encounter (Signed)
Next visit is 11/01/21. Matthew [name] called and is currently taking generic Ativan 1 mg (4 pills a day) and states that it is not working good. He was taking brand name Ativan and it worked better than generic. He wants to know if he can get the brand name 2 mg and cut them in half. His phone number is [phone]. Pharmacy is:  [place], [place]  Phone:  [phone]  Fax:  [phone]

## 2021-09-27 NOTE — Telephone Encounter (Signed)
It was not available. It wasn't a PA issue

## 2021-09-29 ENCOUNTER — Other Ambulatory Visit: Payer: Self-pay | Admitting: Psychiatry

## 2021-09-29 MED ORDER — ATIVAN 2 MG PO TABS: 1.0000 mg | ORAL_TABLET | Freq: Four times a day (QID) | ORAL | 1 refills | Status: DC

## 2021-09-29 NOTE — Telephone Encounter (Signed)
Patient requesting brand Ativan.  Apparently the 1 mg brand is not available therefore he is requesting a prescription for the 2 mg size which he would split to 1/2 tablet 4 times daily.  Sent in that brand Ativan 2 mg tablet as requested.

## 2021-09-29 NOTE — Telephone Encounter (Signed)
Pt informed

## 2021-09-30 ENCOUNTER — Ambulatory Visit (INDEPENDENT_AMBULATORY_CARE_PROVIDER_SITE_OTHER): Payer: Medicare Other | Admitting: Emergency Medicine

## 2021-09-30 DIAGNOSIS — E538 Deficiency of other specified B group vitamins: Secondary | ICD-10-CM

## 2021-10-05 ENCOUNTER — Other Ambulatory Visit: Payer: Self-pay | Admitting: Family Medicine

## 2021-10-05 ENCOUNTER — Other Ambulatory Visit: Payer: Self-pay | Admitting: Psychiatry

## 2021-10-05 DIAGNOSIS — G2119 Other drug induced secondary parkinsonism: Secondary | ICD-10-CM

## 2021-10-05 DIAGNOSIS — F411 Generalized anxiety disorder: Secondary | ICD-10-CM

## 2021-10-05 DIAGNOSIS — F4001 Agoraphobia with panic disorder: Secondary | ICD-10-CM

## 2021-10-05 DIAGNOSIS — F259 Schizoaffective disorder, unspecified: Secondary | ICD-10-CM

## 2021-10-05 DIAGNOSIS — I10 Essential (primary) hypertension: Secondary | ICD-10-CM

## 2021-10-05 DIAGNOSIS — K2100 Gastro-esophageal reflux disease with esophagitis, without bleeding: Secondary | ICD-10-CM

## 2021-10-07 ENCOUNTER — Telehealth: Payer: Self-pay | Admitting: Psychiatry

## 2021-10-07 NOTE — Telephone Encounter (Signed)
Ricky called this morning at 9:15am about his Ativan.  He said the pharmacy told him they couldn't sell him the '2mg'$ .  It wasn't that they didn't have it but that they couldn't sell it to him.  Would it need at Pa?  Anyway, they are trying to get him the '1mg'$ .  If they can get some, he will just get the '1mg'$ .  He is not sure when or if they will be able to get it.  He doesn't like the '2mg'$  anyway because when he cuts it in half it breaks apart into pieces.  He said if they can't get the '1mg'$  he will just to try to work with the generic.  He requested a call back about why the pharmacy can't sell the '2mg'$ .   He has appt 8/7

## 2021-10-07 NOTE — Telephone Encounter (Signed)
Spoke to pharmacy.He will be getting '1mg'$  brand name on [facility] informed

## 2021-10-11 ENCOUNTER — Telehealth: Payer: Self-pay | Admitting: Family Medicine

## 2021-10-11 ENCOUNTER — Other Ambulatory Visit: Payer: Self-pay | Admitting: Nurse Practitioner

## 2021-10-11 DIAGNOSIS — I10 Essential (primary) hypertension: Secondary | ICD-10-CM

## 2021-10-11 NOTE — Telephone Encounter (Signed)
Patient aware.

## 2021-10-11 NOTE — Telephone Encounter (Signed)
CBC, CMP and lipid panel labs today for patient.

## 2021-10-13 ENCOUNTER — Other Ambulatory Visit: Payer: Medicare Other

## 2021-10-13 DIAGNOSIS — I10 Essential (primary) hypertension: Secondary | ICD-10-CM

## 2021-10-13 LAB — LIPID PANEL
Chol/HDL Ratio: 3 ratio (ref 0.0–5.0)
Cholesterol, Total: 182 mg/dL (ref 100–199)
HDL: 61 mg/dL (ref >39)
LDL Chol Calc (NIH): 103 mg/dL — ABNORMAL HIGH (ref 0–99)
Triglycerides: 100 mg/dL (ref 0–149)
VLDL Cholesterol Cal: 18 mg/dL (ref 5–40)

## 2021-10-13 LAB — CBC WITH DIFFERENTIAL/PLATELET
Basophils Absolute: 0.1 10*3/uL (ref 0.0–0.2)
Basos: 1 %
EOS (ABSOLUTE): 0.2 10*3/uL (ref 0.0–0.4)
Eos: 3 %
Hematocrit: 34.6 % — ABNORMAL LOW (ref 37.5–51.0)
Hemoglobin: 11.9 g/dL — ABNORMAL LOW (ref 13.0–17.7)
Immature Grans (Abs): 0 10*3/uL (ref 0.0–0.1)
Immature Granulocytes: 0 %
Lymphocytes Absolute: 1.6 10*3/uL (ref 0.7–3.1)
Lymphs: 24 %
MCH: 30.6 pg (ref 26.6–33.0)
MCHC: 34.4 g/dL (ref 31.5–35.7)
MCV: 89 fL (ref 79–97)
Monocytes Absolute: 0.6 10*3/uL (ref 0.1–0.9)
Monocytes: 9 %
Neutrophils Absolute: 4.3 10*3/uL (ref 1.4–7.0)
Neutrophils: 63 %
Platelets: 219 10*3/uL (ref 150–450)
RBC: 3.89 x10E6/uL — ABNORMAL LOW (ref 4.14–5.80)
RDW: 12.4 % (ref 11.6–15.4)
WBC: 6.7 10*3/uL (ref 3.4–10.8)

## 2021-10-13 LAB — COMPREHENSIVE METABOLIC PANEL
ALT: 21 IU/L (ref 0–44)
AST: 20 IU/L (ref 0–40)
Albumin/Globulin Ratio: 1.4 (ref 1.2–2.2)
Albumin: 4.3 g/dL (ref 3.9–4.9)
Alkaline Phosphatase: 138 IU/L — ABNORMAL HIGH (ref 44–121)
BUN/Creatinine Ratio: 14 (ref 10–24)
BUN: 24 mg/dL (ref 8–27)
Bilirubin Total: 0.3 mg/dL (ref 0.0–1.2)
CO2: 22 mmol/L (ref 20–29)
Calcium: 9.5 mg/dL (ref 8.6–10.2)
Chloride: 104 mmol/L (ref 96–106)
Creatinine, Ser: 1.76 mg/dL — ABNORMAL HIGH (ref 0.76–1.27)
Globulin, Total: 3 g/dL (ref 1.5–4.5)
Glucose: 100 mg/dL — ABNORMAL HIGH (ref 70–99)
Potassium: 4.5 mmol/L (ref 3.5–5.2)
Sodium: 140 mmol/L (ref 134–144)
Total Protein: 7.3 g/dL (ref 6.0–8.5)
eGFR: 41 mL/min/{1.73_m2} — ABNORMAL LOW (ref >59)

## 2021-10-15 ENCOUNTER — Other Ambulatory Visit: Payer: Self-pay | Admitting: Nurse Practitioner

## 2021-10-15 DIAGNOSIS — R790 Abnormal level of blood mineral: Secondary | ICD-10-CM

## 2021-10-15 MED ORDER — FERROUS SULFATE 325 (65 FE) MG PO TBEC: 325.0000 mg | DELAYED_RELEASE_TABLET | Freq: Every day | ORAL | 3 refills | Status: DC

## 2021-10-18 ENCOUNTER — Encounter: Payer: Self-pay | Admitting: Family Medicine

## 2021-10-18 ENCOUNTER — Ambulatory Visit (INDEPENDENT_AMBULATORY_CARE_PROVIDER_SITE_OTHER): Payer: Medicare Other | Admitting: Family Medicine

## 2021-10-18 VITALS — BP 133/61 | HR 66 | Temp 97.3°F | Ht 76.0 in | Wt 173.0 lb

## 2021-10-18 DIAGNOSIS — I10 Essential (primary) hypertension: Secondary | ICD-10-CM

## 2021-10-18 DIAGNOSIS — K2100 Gastro-esophageal reflux disease with esophagitis, without bleeding: Secondary | ICD-10-CM

## 2021-10-18 DIAGNOSIS — F411 Generalized anxiety disorder: Secondary | ICD-10-CM

## 2021-10-18 NOTE — Progress Notes (Signed)
Subjective:  Patient ID: Matthew [name], male    DOB: December 12, 1950  Age: 71 y.o. MRN: [id number]  CC: Medical Management of Chronic Issues   HPI Matthew Cina Battershell presents for  follow-up of hypertension. Patient has no history of headache chest pain or shortness of breath or recent cough. Patient also denies symptoms of TIA such as focal numbness or weakness. Patient denies side effects from medication. States taking it regularly. Now eating better. Some sweets still.    History Matthew [name] has a past medical history of Agoraphobia, Anemia, Anxiety, Arthritis, Asthma, Barrett esophagus, Blood transfusion without reported diagnosis, Bronchitis, Chronic kidney disease, Complication of anesthesia, Educated about COVID-19 virus infection (12/02/2019), Elevated serum creatinine, Esophageal stricture, GERD (gastroesophageal reflux disease), Hypertension, Mood disorder ([place]), Neck pain, chronic, Neuromuscular disorder ([place]), Palpitations (12/02/2019), Rosacea, and Vitamin B12 deficiency.   He has a past surgical history that includes Chest tube insertion; Fracture surgery; Wisdom tooth extraction; Lumbar laminectomy/decompression microdiscectomy (12/06/2011); Colonoscopy (08/20/2003); Upper gastrointestinal endoscopy (03/15/2013); and skin cancer removal from forehead.   His family history includes Diabetes in his mother; Heart disease in his mother; Stroke in his mother.He reports that he quit smoking about 36 years ago. His smoking use included cigarettes. He has a 0.90 pack-year smoking history. He has never used smokeless tobacco. He reports that he does not drink alcohol and does not use drugs.  Current Outpatient Medications on File Prior to Visit  Medication Sig Dispense Refill   amLODipine (NORVASC) 5 MG tablet TAKE ONE TABLET ONCE DAILY EVERY EVENING 90 tablet 0   ATIVAN 1 MG tablet TAKE ONE TABLET FOUR TIMES DAILY. 120 tablet 5   ATIVAN 2 MG tablet Take 0.5 tablets (1 mg total) by mouth 4 (four) times  daily. 60 tablet 1   carbamazepine (TEGRETOL) 200 MG tablet TAKE (1/2) TABLET FOUR TIMES DAILY. 180 tablet 3   chlorproMAZINE (THORAZINE) 100 MG tablet TAKE 1 TABLET 4 TIMES A DAY 360 tablet 1   clobetasol (TEMOVATE) 0.05 % external solution APPLY TOPICALLY 2 TIMES A DAY 50 mL 1   diclofenac Sodium (VOLTAREN) 1 % GEL Apply 2 g topically 4 (four) times daily. (Patient taking differently: Apply 2 g topically daily as needed.) 100 g 1   esomeprazole (NEXIUM) 40 MG capsule TAKE ONE CAPSULE ONCE DAILY 90 capsule 0   famotidine (PEPCID) 20 MG tablet Take 1 tablet (20 mg total) by mouth at bedtime. 90 tablet 3   ferrous sulfate (FE TABS) 325 (65 FE) MG EC tablet Take 1 tablet (325 mg total) by mouth daily with breakfast. 90 tablet 3   fluticasone (FLONASE) 50 MCG/ACT nasal spray 2 SPRAYS IN EACH NOSTRIL ONCE A DAY AS NEEDED 48 g 3   LORazepam (ATIVAN) 1 MG tablet TAKE 1 TABLET 4 TIMES A DAY. MAY TAKE 1 EXTRA TABLET DAILY IF NEEDED FOR PANIC 150 tablet 5   losartan (COZAAR) 100 MG tablet TAKE ONE TABLET ONCE DAILY EVERY MORNING 90 tablet 0   trihexyphenidyl (ARTANE) 2 MG tablet TAKE (1) TABLET TWICE A DAY WITH MEALS (BREAKFAST AND SUPPER) 180 tablet 1   Current Facility-Administered Medications on File Prior to Visit  Medication Dose Route Frequency Provider Last Rate Last Admin   cyanocobalamin ((VITAMIN B-12)) injection 1,000 mcg  1,000 mcg Intramuscular Q30 days Claretta Fraise, MD   1,000 mcg at 09/30/21 1134    ROS Review of Systems  Constitutional:  Negative for fever.  Respiratory:  Negative for shortness of breath.   Cardiovascular:  Negative for chest pain.  Musculoskeletal:  Negative for arthralgias.  Skin:  Negative for rash.    Objective:  BP 133/61   Pulse 66   Temp (!) 97.3 F (36.3 C)   Ht '6\' 4"'$  (1.93 m)   Wt 173 lb (78.5 kg)   SpO2 99%   BMI 21.06 kg/m   BP Readings from Last 3 Encounters:  10/18/21 133/61  08/19/21 120/63  06/15/21 130/60    Wt Readings from Last 3  Encounters:  10/18/21 173 lb (78.5 kg)  08/19/21 169 lb 12.8 oz (77 kg)  06/15/21 181 lb 12.8 oz (82.5 kg)     Physical Exam Vitals reviewed.  Constitutional:      Appearance: He is well-developed.  HENT:     Head: Normocephalic and atraumatic.     Right Ear: External ear normal.     Left Ear: External ear normal.     Mouth/Throat:     Pharynx: No oropharyngeal exudate or posterior oropharyngeal erythema.  Eyes:     Pupils: Pupils are equal, round, and reactive to light.  Cardiovascular:     Rate and Rhythm: Normal rate and regular rhythm.     Heart sounds: No murmur heard. Pulmonary:     Effort: No respiratory distress.     Breath sounds: Normal breath sounds.  Musculoskeletal:     Cervical back: Normal range of motion and neck supple.  Neurological:     Mental Status: He is alert and oriented to person, place, and time.       Assessment & Plan:   Matthew [name] was seen today for medical management of chronic issues.  Diagnoses and all orders for this visit:  Essential hypertension  GAD (generalized anxiety disorder)  Gastroesophageal reflux disease with esophagitis without hemorrhage   Allergies as of 10/18/2021       Reactions   Acetaminophen Nausea And Vomiting   Amoxicillin    REACTION: unspecified/unknown   Cephalexin Swelling   In hands   Erythromycin    REACTION:Increases tegretol level   Nsaids Other (See Comments)   REACTION: Cannot take due to kidney health   Penicillins    REACTION: Unknown        Medication List        Accurate as of October 18, 2021 11:54 AM. If you have any questions, ask your nurse or doctor.          amLODipine 5 MG tablet Commonly known as: NORVASC TAKE ONE TABLET ONCE DAILY EVERY EVENING   carbamazepine 200 MG tablet Commonly known as: TEGretol TAKE (1/2) TABLET FOUR TIMES DAILY.   chlorproMAZINE 100 MG tablet Commonly known as: THORAZINE TAKE 1 TABLET 4 TIMES A DAY   clobetasol 0.05 % external  solution Commonly known as: TEMOVATE APPLY TOPICALLY 2 TIMES A DAY   diclofenac Sodium 1 % Gel Commonly known as: Voltaren Apply 2 g topically 4 (four) times daily. What changed:  when to take this reasons to take this   esomeprazole 40 MG capsule Commonly known as: NEXIUM TAKE ONE CAPSULE ONCE DAILY   famotidine 20 MG tablet Commonly known as: PEPCID Take 1 tablet (20 mg total) by mouth at bedtime.   ferrous sulfate 325 (65 FE) MG EC tablet Commonly known as: Fe Tabs Take 1 tablet (325 mg total) by mouth daily with breakfast.   fluticasone 50 MCG/ACT nasal spray Commonly known as: FLONASE 2 SPRAYS IN EACH NOSTRIL ONCE A DAY AS NEEDED   LORazepam 1 MG tablet Commonly  known as: ATIVAN TAKE 1 TABLET 4 TIMES A DAY. MAY TAKE 1 EXTRA TABLET DAILY IF NEEDED FOR PANIC   Ativan 2 MG tablet Generic drug: LORazepam Take 0.5 tablets (1 mg total) by mouth 4 (four) times daily.   Ativan 1 MG tablet Generic drug: LORazepam TAKE ONE TABLET FOUR TIMES DAILY.   losartan 100 MG tablet Commonly known as: COZAAR TAKE ONE TABLET ONCE DAILY EVERY MORNING   trihexyphenidyl 2 MG tablet Commonly known as: ARTANE TAKE (1) TABLET TWICE A DAY WITH MEALS (BREAKFAST AND SUPPER)        No orders of the defined types were placed in this encounter.     Follow-up: Return in about 6 months (around 04/20/2022).  Claretta Fraise, M.D.

## 2021-10-25 ENCOUNTER — Ambulatory Visit (INDEPENDENT_AMBULATORY_CARE_PROVIDER_SITE_OTHER): Payer: Medicare Other

## 2021-10-25 DIAGNOSIS — Z23 Encounter for immunization: Secondary | ICD-10-CM

## 2021-10-25 NOTE — Progress Notes (Signed)
Shingrix given to patient and tolerated well.

## 2021-11-01 ENCOUNTER — Ambulatory Visit: Payer: Medicare Other

## 2021-11-01 ENCOUNTER — Encounter: Payer: Self-pay | Admitting: Psychiatry

## 2021-11-01 ENCOUNTER — Ambulatory Visit (INDEPENDENT_AMBULATORY_CARE_PROVIDER_SITE_OTHER): Payer: Medicare Other | Admitting: Psychiatry

## 2021-11-01 DIAGNOSIS — Z87820 Personal history of traumatic brain injury: Secondary | ICD-10-CM

## 2021-11-01 DIAGNOSIS — F411 Generalized anxiety disorder: Secondary | ICD-10-CM | POA: Diagnosis not present

## 2021-11-01 DIAGNOSIS — G3184 Mild cognitive impairment, so stated: Secondary | ICD-10-CM

## 2021-11-01 DIAGNOSIS — F259 Schizoaffective disorder, unspecified: Secondary | ICD-10-CM

## 2021-11-01 DIAGNOSIS — F4001 Agoraphobia with panic disorder: Secondary | ICD-10-CM | POA: Diagnosis not present
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
## 2021-11-02 ENCOUNTER — Ambulatory Visit (INDEPENDENT_AMBULATORY_CARE_PROVIDER_SITE_OTHER): Payer: Medicare Other

## 2021-11-02 DIAGNOSIS — E538 Deficiency of other specified B group vitamins: Secondary | ICD-10-CM | POA: Diagnosis not present

## 2021-11-02 NOTE — Progress Notes (Signed)
Cyanocobalamin injection given to left deltoid.  Patient tolerated well. 

## 2021-11-03 ENCOUNTER — Ambulatory Visit (INDEPENDENT_AMBULATORY_CARE_PROVIDER_SITE_OTHER): Payer: Medicare Other | Admitting: Nurse Practitioner

## 2021-11-03 ENCOUNTER — Encounter: Payer: Self-pay | Admitting: Nurse Practitioner

## 2021-11-03 ENCOUNTER — Ambulatory Visit (INDEPENDENT_AMBULATORY_CARE_PROVIDER_SITE_OTHER): Payer: Medicare Other

## 2021-11-03 VITALS — BP 142/70 | HR 67 | Temp 98.4°F | Ht 76.0 in | Wt 170.0 lb

## 2021-11-03 DIAGNOSIS — R0781 Pleurodynia: Secondary | ICD-10-CM | POA: Diagnosis not present

## 2021-11-03 NOTE — Progress Notes (Signed)
Acute Office Visit  Subjective:     Patient ID: Matthew [name], male    DOB: 08-03-1950, 71 y.o.   MRN: [id number]  Chief Complaint  Patient presents with   Flank Pain    right    Flank Pain This is a new problem. The current episode started yesterday. The problem occurs constantly. The problem is unchanged. Pain location: right rib/flank area. The quality of the pain is described as aching. The pain does not radiate. The pain is at a severity of 5/10. The pain is moderate. The pain is The same all the time. The symptoms are aggravated by bending and position. Stiffness is present All day. Pertinent negatives include no abdominal pain, chest pain, headaches or leg pain.     Review of Systems  Constitutional: Negative.   Eyes: Negative.   Cardiovascular: Negative.  Negative for chest pain.  Gastrointestinal:  Negative for abdominal pain.  Genitourinary:  Positive for flank pain.  Skin: Negative.  Negative for rash.  Neurological: Negative.  Negative for headaches.  Psychiatric/Behavioral: Negative.    All other systems reviewed and are negative.       Objective:    BP (!) 142/70   Pulse 67   Temp 98.4 F (36.9 C)   Ht '6\' 4"'$  (1.93 m)   Wt 170 lb (77.1 kg)   SpO2 97%   BMI 20.69 kg/m  BP Readings from Last 3 Encounters:  11/03/21 (!) 142/70  10/18/21 133/61  08/19/21 120/63   Wt Readings from Last 3 Encounters:  11/03/21 170 lb (77.1 kg)  10/18/21 173 lb (78.5 kg)  08/19/21 169 lb 12.8 oz (77 kg)      Physical Exam Vitals and nursing note reviewed.  Constitutional:      Appearance: Normal appearance.  HENT:     Head: Normocephalic.     Right Ear: External ear normal.     Left Ear: External ear normal.  Cardiovascular:     Rate and Rhythm: Normal rate and regular rhythm.     Pulses: Normal pulses.     Heart sounds: Normal heart sounds.  Pulmonary:     Effort: Pulmonary effort is normal.     Breath sounds: Normal breath sounds.  Chest:        Comments: Right chest/flank pain Musculoskeletal:        General: Normal range of motion.  Skin:    General: Skin is warm.     Findings: No erythema or rash.  Neurological:     General: No focal deficit present.     Mental Status: He is alert and oriented to person, place, and time.  Psychiatric:        Mood and Affect: Mood normal.        Behavior: Behavior normal.     No results found for any visits on 11/03/21.      Assessment & Plan:  Patient ran into the door in a dark room and hit his right sided chest wall, he describes pain as moderate and worse while inhaling. Guarding while coughing.  Ice compress , tylenol or ibuprofen as tolerated for pain. Completed chest x-ray to rule out rib fracture,   Problem List Items Addressed This Visit   None Visit Diagnoses     Rib pain on right side    -  Primary   Relevant Orders   DG Chest 2 View       No orders of the defined types were placed in this  encounter.   No follow-ups on file.  Ivy Lynn, NP

## 2021-12-01 ENCOUNTER — Other Ambulatory Visit: Payer: Medicare Other

## 2021-12-03 ENCOUNTER — Ambulatory Visit (INDEPENDENT_AMBULATORY_CARE_PROVIDER_SITE_OTHER): Payer: Medicare Other | Admitting: *Deleted

## 2021-12-03 DIAGNOSIS — E538 Deficiency of other specified B group vitamins: Secondary | ICD-10-CM

## 2021-12-31 ENCOUNTER — Other Ambulatory Visit: Payer: Self-pay | Admitting: Family Medicine

## 2021-12-31 DIAGNOSIS — I10 Essential (primary) hypertension: Secondary | ICD-10-CM

## 2021-12-31 DIAGNOSIS — K2100 Gastro-esophageal reflux disease with esophagitis, without bleeding: Secondary | ICD-10-CM

## 2022-01-04 ENCOUNTER — Ambulatory Visit (INDEPENDENT_AMBULATORY_CARE_PROVIDER_SITE_OTHER): Payer: Medicare Other

## 2022-01-04 DIAGNOSIS — E538 Deficiency of other specified B group vitamins: Secondary | ICD-10-CM

## 2022-01-04 NOTE — Progress Notes (Signed)
Cyanocobalamin injection given to right deltoid.  Patient tolerated well. 

## 2022-01-20 ENCOUNTER — Encounter: Payer: Self-pay | Admitting: Family Medicine

## 2022-01-20 ENCOUNTER — Ambulatory Visit (INDEPENDENT_AMBULATORY_CARE_PROVIDER_SITE_OTHER): Payer: Medicare Other | Admitting: Family Medicine

## 2022-01-20 VITALS — BP 134/67 | HR 74 | Temp 97.8°F | Ht 76.0 in | Wt 170.4 lb

## 2022-01-20 DIAGNOSIS — H6591 Unspecified nonsuppurative otitis media, right ear: Secondary | ICD-10-CM

## 2022-01-20 DIAGNOSIS — J069 Acute upper respiratory infection, unspecified: Secondary | ICD-10-CM | POA: Diagnosis not present

## 2022-01-20 MED ORDER — DOXYCYCLINE HYCLATE 100 MG PO CAPS: 100.0000 mg | ORAL_CAPSULE | Freq: Two times a day (BID) | ORAL | 0 refills | Status: DC

## 2022-01-20 MED ORDER — PSEUDOEPHEDRINE-GUAIFENESIN ER 120-1200 MG PO TB12: 1.0000 | ORAL_TABLET | Freq: Two times a day (BID) | ORAL | 0 refills | Status: DC

## 2022-01-20 NOTE — Progress Notes (Signed)
Subjective:  Patient ID: Matthew [name] Service, male    DOB: 08/07/1950  Age: 71 y.o. MRN: [id number]  CC: Cough and Nasal Congestion   HPI Matthew [name] for aching all over last night. Some upper chest congestion. Runny nose. Ears are itchy. Throat is sore & eyes hurt. Not coughing much. Body feels "fragile."     01/20/2022   11:46 AM 11/03/2021    2:30 PM 10/18/2021   11:22 AM  Depression screen PHQ 2/9  Decreased Interest 0 0 0  Down, Depressed, Hopeless 0 0 0  PHQ - 2 Score 0 0 0  Altered sleeping  0 0  Tired, decreased energy  0 0  Change in appetite  0 0  Feeling bad or failure about yourself   0 0  Trouble concentrating  0 0  Moving slowly or fidgety/restless  0 0  Suicidal thoughts  0 0  PHQ-9 Score  0 0  Difficult doing work/chores   Somewhat difficult    History Matthew [name] has a past medical history of Agoraphobia, Anemia, Anxiety, Arthritis, Asthma, Barrett esophagus, Blood transfusion without reported diagnosis, Bronchitis, Chronic kidney disease, Complication of anesthesia, Educated about COVID-19 virus infection (12/02/2019), Elevated serum creatinine, Esophageal stricture, GERD (gastroesophageal reflux disease), Hypertension, Mood disorder ([place]), Neck pain, chronic, Neuromuscular disorder ([place]), Palpitations (12/02/2019), Rosacea, and Vitamin B12 deficiency.   He has a past surgical history that includes Chest tube insertion; Fracture surgery; Wisdom tooth extraction; Lumbar laminectomy/decompression microdiscectomy (12/06/2011); Colonoscopy (08/20/2003); Upper gastrointestinal endoscopy (03/15/2013); and skin cancer removal from forehead.   His family history includes Diabetes in his mother; Heart disease in his mother; Stroke in his mother.He reports that he quit smoking about 36 years ago. His smoking use included cigarettes. He has a 0.90 pack-year smoking history. He has never used smokeless tobacco. He reports that he does not drink alcohol and does not use  drugs.    ROS Review of Systems  Constitutional:  Negative for activity change, appetite change, chills and fever.  HENT:  Positive for congestion, ear pain, postnasal drip, rhinorrhea and sinus pressure. Negative for ear discharge, hearing loss, nosebleeds, sneezing and trouble swallowing.   Respiratory:  Negative for cough, chest tightness and shortness of breath.   Cardiovascular:  Negative for chest pain and palpitations.  Skin:  Negative for rash.    Objective:  BP 134/67   Pulse 74   Temp 97.8 F (36.6 C)   Ht '6\' 4"'$  (1.93 m)   Wt 170 lb 6.4 oz (77.3 kg)   SpO2 97%   BMI 20.74 kg/m   BP Readings from Last 3 Encounters:  01/20/22 134/67  11/03/21 (!) 142/70  10/18/21 133/61    Wt Readings from Last 3 Encounters:  01/20/22 170 lb 6.4 oz (77.3 kg)  11/03/21 170 lb (77.1 kg)  10/18/21 173 lb (78.5 kg)     Physical Exam Constitutional:      Appearance: He is well-developed.  HENT:     Head: Normocephalic and atraumatic.     Right Ear: External ear normal. No decreased hearing noted.     Left Ear: External ear normal. No decreased hearing noted.     Ears:     Comments: TM on right dark, dull    Nose: Mucosal edema present.     Right Sinus: No frontal sinus tenderness.     Left Sinus: No frontal sinus tenderness.     Mouth/Throat:     Pharynx: No oropharyngeal exudate or posterior oropharyngeal  erythema.  Neck:     Meningeal: Brudzinski's sign absent.  Pulmonary:     Effort: No respiratory distress.     Breath sounds: Normal breath sounds.  Lymphadenopathy:     Head:     Right side of head: No preauricular adenopathy.     Left side of head: No preauricular adenopathy.     Cervical:     Right cervical: No superficial cervical adenopathy.    Left cervical: No superficial cervical adenopathy.       Assessment & Plan:   Matthew [name] was seen today for cough and nasal congestion.  Diagnoses and all orders for this visit:  URI with cough and congestion -      COVID-19, Flu A+B and RSV  Right otitis media with effusion  Other orders -     Pseudoephedrine-Guaifenesin [phone] MG TB12; Take 1 tablet by mouth 2 (two) times daily. For congestion -     doxycycline (VIBRAMYCIN) 100 MG capsule; Take 1 capsule (100 mg total) by mouth 2 (two) times daily.       I am having Matthew [name] start on Pseudoephedrine-Guaifenesin and doxycycline. I am also having him maintain his diclofenac Sodium, fluticasone, famotidine, clobetasol, carbamazepine, LORazepam, trihexyphenidyl, chlorproMAZINE, Ativan, ferrous sulfate, amLODipine, losartan, and esomeprazole. We will continue to administer cyanocobalamin.  Allergies as of 01/20/2022       Reactions   Acetaminophen Nausea And Vomiting   Amoxicillin    REACTION: unspecified/unknown   Cephalexin Swelling   In hands   Erythromycin    REACTION:Increases tegretol level   Nsaids Other (See Comments)   REACTION: Cannot take due to kidney health   Penicillins    REACTION: Unknown        Medication List        Accurate as of January 20, 2022 12:15 PM. If you have any questions, ask your nurse or doctor.          amLODipine 5 MG tablet Commonly known as: NORVASC TAKE ONE TABLET ONCE DAILY EVERY EVENING   carbamazepine 200 MG tablet Commonly known as: TEGretol TAKE (1/2) TABLET FOUR TIMES DAILY.   chlorproMAZINE 100 MG tablet Commonly known as: THORAZINE TAKE 1 TABLET 4 TIMES A DAY   clobetasol 0.05 % external solution Commonly known as: TEMOVATE APPLY TOPICALLY 2 TIMES A DAY   diclofenac Sodium 1 % Gel Commonly known as: Voltaren Apply 2 g topically 4 (four) times daily. What changed:  when to take this reasons to take this   doxycycline 100 MG capsule Commonly known as: Vibramycin Take 1 capsule (100 mg total) by mouth 2 (two) times daily. Started by: Claretta Fraise, MD   esomeprazole 40 MG capsule Commonly known as: NEXIUM TAKE ONE CAPSULE ONCE DAILY   famotidine 20 MG  tablet Commonly known as: PEPCID Take 1 tablet (20 mg total) by mouth at bedtime.   ferrous sulfate 325 (65 FE) MG EC tablet Commonly known as: Fe Tabs Take 1 tablet (325 mg total) by mouth daily with breakfast.   fluticasone 50 MCG/ACT nasal spray Commonly known as: FLONASE 2 SPRAYS IN EACH NOSTRIL ONCE A DAY AS NEEDED   LORazepam 1 MG tablet Commonly known as: ATIVAN TAKE 1 TABLET 4 TIMES A DAY. MAY TAKE 1 EXTRA TABLET DAILY IF NEEDED FOR PANIC   Ativan 1 MG tablet Generic drug: LORazepam TAKE ONE TABLET FOUR TIMES DAILY.   losartan 100 MG tablet Commonly known as: COZAAR TAKE ONE TABLET ONCE DAILY EVERY MORNING  Pseudoephedrine-Guaifenesin 215 [id number] MG Tb12 Take 1 tablet by mouth 2 (two) times daily. For congestion Started by: Claretta Fraise, MD   trihexyphenidyl 2 MG tablet Commonly known as: ARTANE TAKE (1) TABLET TWICE A DAY WITH MEALS (BREAKFAST AND SUPPER)         Follow-up: Return if symptoms worsen or fail to improve.  Claretta Fraise, M.D.

## 2022-01-21 LAB — COVID-19, FLU A+B AND RSV
Influenza A, NAA: NOT DETECTED
Influenza B, NAA: NOT DETECTED
RSV, NAA: NOT DETECTED
SARS-CoV-2, NAA: NOT DETECTED

## 2022-01-21 NOTE — Progress Notes (Signed)
Hello Aurther,  Your lab result is normal and/or stable.Some minor variations that are not significant are commonly marked abnormal, but do not represent any medical problem for you.  Best regards, Tache Bobst, M.D.

## 2022-01-25 ENCOUNTER — Telehealth: Payer: Self-pay | Admitting: Family Medicine

## 2022-01-25 NOTE — Telephone Encounter (Signed)
Yes. Always finish all antibiotics!

## 2022-01-26 ENCOUNTER — Ambulatory Visit: Payer: Medicare Other

## 2022-01-26 NOTE — Telephone Encounter (Signed)
Patient aware.

## 2022-02-04 ENCOUNTER — Ambulatory Visit (INDEPENDENT_AMBULATORY_CARE_PROVIDER_SITE_OTHER): Payer: Medicare Other

## 2022-02-04 DIAGNOSIS — E538 Deficiency of other specified B group vitamins: Secondary | ICD-10-CM | POA: Diagnosis not present

## 2022-02-04 DIAGNOSIS — Z23 Encounter for immunization: Secondary | ICD-10-CM | POA: Diagnosis not present

## 2022-02-04 NOTE — Progress Notes (Signed)
Cyanocobalamin injection given to left deltoid.  Patient tolerated well. 

## 2022-02-28 ENCOUNTER — Other Ambulatory Visit: Payer: Self-pay | Admitting: Psychiatry

## 2022-02-28 ENCOUNTER — Other Ambulatory Visit: Payer: Self-pay | Admitting: Family Medicine

## 2022-02-28 DIAGNOSIS — F259 Schizoaffective disorder, unspecified: Secondary | ICD-10-CM

## 2022-02-28 NOTE — Telephone Encounter (Signed)
Has appt with [name] tomorrow.

## 2022-03-01 ENCOUNTER — Encounter: Payer: Self-pay | Admitting: Psychiatry

## 2022-03-01 ENCOUNTER — Ambulatory Visit (INDEPENDENT_AMBULATORY_CARE_PROVIDER_SITE_OTHER): Payer: Medicare Other | Admitting: Psychiatry

## 2022-03-01 DIAGNOSIS — F4001 Agoraphobia with panic disorder: Secondary | ICD-10-CM

## 2022-03-01 DIAGNOSIS — G2119 Other drug induced secondary parkinsonism: Secondary | ICD-10-CM

## 2022-03-01 DIAGNOSIS — F411 Generalized anxiety disorder: Secondary | ICD-10-CM | POA: Diagnosis not present

## 2022-03-01 DIAGNOSIS — F259 Schizoaffective disorder, unspecified: Secondary | ICD-10-CM | POA: Diagnosis not present

## 2022-03-01 DIAGNOSIS — G3184 Mild cognitive impairment, so stated: Secondary | ICD-10-CM

## 2022-03-01 DIAGNOSIS — Z87820 Personal history of traumatic brain injury: Secondary | ICD-10-CM

## 2022-03-01 MED ORDER — CARBAMAZEPINE 200 MG PO TABS: ORAL_TABLET | ORAL | 3 refills | Status: DC

## 2022-03-01 MED ORDER — TRIHEXYPHENIDYL HCL 2 MG PO TABS: ORAL_TABLET | ORAL | 1 refills | Status: DC
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
## 2022-03-07 ENCOUNTER — Ambulatory Visit (INDEPENDENT_AMBULATORY_CARE_PROVIDER_SITE_OTHER): Payer: Medicare Other

## 2022-03-07 DIAGNOSIS — E538 Deficiency of other specified B group vitamins: Secondary | ICD-10-CM | POA: Diagnosis not present

## 2022-03-07 NOTE — Progress Notes (Signed)
Tolerated b12 very well

## 2022-04-05 ENCOUNTER — Other Ambulatory Visit: Payer: Self-pay | Admitting: Psychiatry

## 2022-04-05 DIAGNOSIS — F259 Schizoaffective disorder, unspecified: Secondary | ICD-10-CM

## 2022-04-05 NOTE — Telephone Encounter (Signed)
Filled 10/12 appt 4/4

## 2022-04-07 ENCOUNTER — Ambulatory Visit (INDEPENDENT_AMBULATORY_CARE_PROVIDER_SITE_OTHER): Payer: Medicare Other | Admitting: *Deleted

## 2022-04-07 DIAGNOSIS — E538 Deficiency of other specified B group vitamins: Secondary | ICD-10-CM | POA: Diagnosis not present

## 2022-04-07 MED ORDER — CYANOCOBALAMIN 1000 MCG/ML IJ SOLN
1000.0000 ug | Freq: Once | INTRAMUSCULAR | Status: AC
  Administered 2022-04-07: 1000 ug via INTRAMUSCULAR

## 2022-04-07 NOTE — Progress Notes (Signed)
B12 injection given, left deltoid, intramuscular. Patient tolerated well.

## 2022-04-13 ENCOUNTER — Other Ambulatory Visit: Payer: Self-pay

## 2022-04-14 ENCOUNTER — Telehealth: Payer: Self-pay | Admitting: Family Medicine

## 2022-04-14 ENCOUNTER — Other Ambulatory Visit: Payer: Self-pay | Admitting: Family Medicine

## 2022-04-14 DIAGNOSIS — E559 Vitamin D deficiency, unspecified: Secondary | ICD-10-CM

## 2022-04-14 DIAGNOSIS — E538 Deficiency of other specified B group vitamins: Secondary | ICD-10-CM

## 2022-04-14 DIAGNOSIS — D509 Iron deficiency anemia, unspecified: Secondary | ICD-10-CM

## 2022-04-14 DIAGNOSIS — E782 Mixed hyperlipidemia: Secondary | ICD-10-CM

## 2022-04-14 DIAGNOSIS — F259 Schizoaffective disorder, unspecified: Secondary | ICD-10-CM

## 2022-04-14 DIAGNOSIS — I10 Essential (primary) hypertension: Secondary | ICD-10-CM

## 2022-04-14 DIAGNOSIS — K2100 Gastro-esophageal reflux disease with esophagitis, without bleeding: Secondary | ICD-10-CM

## 2022-04-14 NOTE — Telephone Encounter (Signed)
Called to notify patient - # was busy - tried multiple times

## 2022-04-14 NOTE — Telephone Encounter (Signed)
Labs ordered. Please tell pt. Needs to be fasting.

## 2022-04-18 ENCOUNTER — Telehealth: Payer: Self-pay | Admitting: Psychiatry

## 2022-04-18 ENCOUNTER — Other Ambulatory Visit: Payer: Self-pay

## 2022-04-18 MED ORDER — LORAZEPAM 1 MG PO TABS: ORAL_TABLET | ORAL | 3 refills | Status: DC

## 2022-04-18 NOTE — Telephone Encounter (Signed)
Pended.

## 2022-04-18 NOTE — Telephone Encounter (Signed)
Pharmacy called and said that they can't get the brand name ativan in stock. Please send a script of the generic lorazapam to them since they have that in stock

## 2022-04-18 NOTE — Telephone Encounter (Signed)
Pended Rx for lorazepam. Pharmacy unable to get brand Ativan currently.

## 2022-04-20 ENCOUNTER — Other Ambulatory Visit: Payer: Medicare Other

## 2022-04-21 LAB — CBC WITH DIFFERENTIAL/PLATELET
Basophils Absolute: 0.1 10*3/uL (ref 0.0–0.2)
Basos: 1 %
EOS (ABSOLUTE): 0.3 10*3/uL (ref 0.0–0.4)
Eos: 4 %
Hematocrit: 35.4 % — ABNORMAL LOW (ref 37.5–51.0)
Hemoglobin: 11.5 g/dL — ABNORMAL LOW (ref 13.0–17.7)
Immature Grans (Abs): 0 10*3/uL (ref 0.0–0.1)
Immature Granulocytes: 0 %
Lymphocytes Absolute: 1.3 10*3/uL (ref 0.7–3.1)
Lymphs: 20 %
MCH: 29.1 pg (ref 26.6–33.0)
MCHC: 32.5 g/dL (ref 31.5–35.7)
MCV: 90 fL (ref 79–97)
Monocytes Absolute: 0.6 10*3/uL (ref 0.1–0.9)
Monocytes: 9 %
Neutrophils Absolute: 4.3 10*3/uL (ref 1.4–7.0)
Neutrophils: 66 %
Platelets: 248 10*3/uL (ref 150–450)
RBC: 3.95 x10E6/uL — ABNORMAL LOW (ref 4.14–5.80)
RDW: 12.1 % (ref 11.6–15.4)
WBC: 6.5 10*3/uL (ref 3.4–10.8)

## 2022-04-21 LAB — CMP14+EGFR
ALT: 22 IU/L (ref 0–44)
AST: 23 IU/L (ref 0–40)
Albumin/Globulin Ratio: 1.5 (ref 1.2–2.2)
Albumin: 4.3 g/dL (ref 3.9–4.9)
Alkaline Phosphatase: 144 IU/L — ABNORMAL HIGH (ref 44–121)
BUN/Creatinine Ratio: 16 (ref 10–24)
BUN: 30 mg/dL — ABNORMAL HIGH (ref 8–27)
Bilirubin Total: 0.2 mg/dL (ref 0.0–1.2)
CO2: 24 mmol/L (ref 20–29)
Calcium: 9.2 mg/dL (ref 8.6–10.2)
Chloride: 103 mmol/L (ref 96–106)
Creatinine, Ser: 1.84 mg/dL — ABNORMAL HIGH (ref 0.76–1.27)
Globulin, Total: 2.8 g/dL (ref 1.5–4.5)
Glucose: 104 mg/dL — ABNORMAL HIGH (ref 70–99)
Potassium: 4.4 mmol/L (ref 3.5–5.2)
Sodium: 139 mmol/L (ref 134–144)
Total Protein: 7.1 g/dL (ref 6.0–8.5)
eGFR: 39 mL/min/{1.73_m2} — ABNORMAL LOW (ref >59)

## 2022-04-21 LAB — LIPID PANEL
Chol/HDL Ratio: 3.3 ratio (ref 0.0–5.0)
Cholesterol, Total: 169 mg/dL (ref 100–199)
HDL: 51 mg/dL (ref >39)
LDL Chol Calc (NIH): 96 mg/dL (ref 0–99)
Triglycerides: 124 mg/dL (ref 0–149)
VLDL Cholesterol Cal: 22 mg/dL (ref 5–40)

## 2022-04-21 LAB — VITAMIN B12: Vitamin B-12: 968 pg/mL (ref 232–1245)

## 2022-04-21 LAB — IRON,TIBC AND FERRITIN PANEL
Ferritin: 90 ng/mL (ref 30–400)
Iron Saturation: 46 % (ref 15–55)
Iron: 113 ug/dL (ref 38–169)
Total Iron Binding Capacity: 248 ug/dL — ABNORMAL LOW (ref 250–450)
UIBC: 135 ug/dL (ref 111–343)

## 2022-04-21 LAB — CARBAMAZEPINE LEVEL, TOTAL: Carbamazepine (Tegretol), S: 6.3 ug/mL (ref 4.0–12.0)

## 2022-04-21 LAB — VITAMIN D 25 HYDROXY (VIT D DEFICIENCY, FRACTURES): Vit D, 25-Hydroxy: 26.8 ng/mL — ABNORMAL LOW (ref 30.0–100.0)

## 2022-04-21 NOTE — Progress Notes (Signed)
Dear Matthew [name], Your Vitamin D is  low. You need a prescription strength supplement I will send that in for you. The rest of the labs look good.  Nurse, if at all possible, could you send in a prescription for the patient for vitamin D 50,000 units, 1 p.o. weekly #13 with 3 refills? Many thanks, WS

## 2022-04-25 ENCOUNTER — Encounter: Payer: Self-pay | Admitting: Family Medicine

## 2022-04-25 ENCOUNTER — Ambulatory Visit (INDEPENDENT_AMBULATORY_CARE_PROVIDER_SITE_OTHER): Payer: Medicare Other | Admitting: Family Medicine

## 2022-04-25 VITALS — BP 119/63 | HR 76 | Temp 96.7°F | Ht 76.0 in | Wt 170.4 lb

## 2022-04-25 DIAGNOSIS — E559 Vitamin D deficiency, unspecified: Secondary | ICD-10-CM

## 2022-04-25 DIAGNOSIS — I10 Essential (primary) hypertension: Secondary | ICD-10-CM | POA: Diagnosis not present

## 2022-04-25 DIAGNOSIS — F259 Schizoaffective disorder, unspecified: Secondary | ICD-10-CM

## 2022-04-25 DIAGNOSIS — K2100 Gastro-esophageal reflux disease with esophagitis, without bleeding: Secondary | ICD-10-CM | POA: Diagnosis not present

## 2022-04-25 MED ORDER — VITAMIN D (ERGOCALCIFEROL) 1.25 MG (50000 UNIT) PO CAPS: 50000.0000 [IU] | ORAL_CAPSULE | ORAL | 3 refills | Status: AC

## 2022-04-25 NOTE — Progress Notes (Signed)
Subjective:  Patient ID: Matthew [name], male    DOB: [birth year]  Age: 72 y.o. MRN: [id number]  CC: Medical Management of Chronic Issues   HPI Matthew [name] presents for  follow-up of hypertension. Patient has no history of headache chest pain or shortness of breath or recent cough. Patient also denies symptoms of TIA such as focal numbness or weakness. Patient denies side effects from medication. States taking it regularly.  Patient in for follow-up of GERD. Currently asymptomatic taking  PPI daily. There is no chest pain or heartburn. No hematemesis and no melena. No dysphagia or choking. Onset is remote. Progression is stable. Complicating factors, none.  Discussed labs showing anemia, CRF & low vitamin D.   Continues thorazine under guidance of [name].   History Matthew [name] has a past medical history of Agoraphobia, Anemia, Anxiety, Arthritis, Asthma, Barrett esophagus, Blood transfusion without reported diagnosis, Bronchitis, Chronic kidney disease, Complication of anesthesia, Educated about COVID-19 virus infection (12/02/2019), Elevated serum creatinine, Esophageal stricture, GERD (gastroesophageal reflux disease), Hypertension, Mood disorder (Matthew [name]), Neck pain, chronic, Neuromuscular disorder (Matthew [name]), Palpitations (12/02/2019), Rosacea, and Vitamin B12 deficiency.   He has a past surgical history that includes Chest tube insertion; Fracture surgery; Wisdom tooth extraction; Lumbar laminectomy/decompression microdiscectomy (12/06/2011); Colonoscopy (08/20/2003); Upper gastrointestinal endoscopy (03/15/2013); and skin cancer removal from forehead.   His family history includes Diabetes in his mother; Heart disease in his mother; Stroke in his mother.He reports that he quit smoking about 37 years ago. His smoking use included cigarettes. He has a 0.90 pack-year smoking history. He has never used smokeless tobacco. He reports that he does not drink alcohol and does not use drugs.  Current  Outpatient Medications on File Prior to Visit  Medication Sig Dispense Refill   amLODipine (NORVASC) 5 MG tablet TAKE ONE TABLET ONCE DAILY EVERY EVENING 90 tablet 1   carbamazepine (TEGRETOL) 200 MG tablet TAKE (1/2) TABLET FOUR TIMES DAILY. 180 tablet 3   chlorproMAZINE (THORAZINE) 100 MG tablet TAKE ONE TABLET FOUR TIMES DAILY 360 tablet 1   clobetasol (TEMOVATE) 0.05 % external solution APPLY TOPICALLY 2 TIMES A DAY 50 mL 1   diclofenac Sodium (VOLTAREN) 1 % GEL Apply 2 g topically 4 (four) times daily. (Patient taking differently: Apply 2 g topically daily as needed.) 100 g 1   esomeprazole (NEXIUM) 40 MG capsule TAKE ONE CAPSULE ONCE DAILY 90 capsule 1   famotidine (PEPCID) 20 MG tablet TAKE ONE TABLET AT BEDTIME 90 tablet 0   ferrous sulfate (FE TABS) 325 (65 FE) MG EC tablet Take 1 tablet (325 mg total) by mouth daily with breakfast. 90 tablet 3   fluticasone (FLONASE) 50 MCG/ACT nasal spray 2 SPRAYS IN EACH NOSTRIL ONCE A DAY AS NEEDED 48 g 3   LORazepam (ATIVAN) 1 MG tablet TAKE 1 TABLET 4 TIMES A DAY. MAY TAKE 1 EXTRA TABLET DAILY IF NEEDED FOR PANIC 150 tablet 3   losartan (COZAAR) 100 MG tablet TAKE ONE TABLET ONCE DAILY EVERY MORNING 90 tablet 1   trihexyphenidyl (ARTANE) 2 MG tablet TAKE (1) TABLET TWICE A DAY WITH MEALS (BREAKFAST AND SUPPER) 180 tablet 1   No current facility-administered medications on file prior to visit.    ROS Review of Systems  Constitutional:  Negative for fever.  Respiratory:  Negative for shortness of breath.   Cardiovascular:  Negative for chest pain.  Musculoskeletal:  Negative for arthralgias.  Skin:  Negative for rash.    Objective:  BP 119/63  Pulse 76   Temp (!) 96.7 F (35.9 C)   Ht '6\' 4"'$  (1.93 m)   Wt 170 lb 6.4 oz (77.3 kg)   SpO2 98%   BMI 20.74 kg/m   BP Readings from Last 3 Encounters:  04/25/22 119/63  01/20/22 134/67  11/03/21 (!) 142/70    Wt Readings from Last 3 Encounters:  04/25/22 170 lb 6.4 oz (77.3 kg)   01/20/22 170 lb 6.4 oz (77.3 kg)  11/03/21 170 lb (77.1 kg)     Physical Exam Vitals reviewed.  Constitutional:      Appearance: He is well-developed.  HENT:     Head: Normocephalic and atraumatic.     Right Ear: External ear normal.     Left Ear: External ear normal.     Mouth/Throat:     Pharynx: No oropharyngeal exudate or posterior oropharyngeal erythema.  Eyes:     Pupils: Pupils are equal, round, and reactive to light.  Cardiovascular:     Rate and Rhythm: Normal rate and regular rhythm.     Heart sounds: No murmur heard. Pulmonary:     Effort: No respiratory distress.     Breath sounds: Normal breath sounds.  Musculoskeletal:     Cervical back: Normal range of motion and neck supple.  Neurological:     Mental Status: He is alert and oriented to person, place, and time.       Assessment & Plan:   Matthew [name] was seen today for medical management of chronic issues.  Diagnoses and all orders for this visit:  Gastroesophageal reflux disease with esophagitis without hemorrhage  Schizoaffective disorder, unspecified type (Matthew [name])  Essential hypertension  Vitamin D deficiency  Other orders -     Vitamin D, Ergocalciferol, (DRISDOL) 1.25 MG (50000 UNIT) CAPS capsule; Take 1 capsule (50,000 Units total) by mouth every 7 (seven) days.   Allergies as of 04/25/2022       Reactions   Acetaminophen Nausea And Vomiting   Amoxicillin    REACTION: unspecified/unknown   Cephalexin Swelling   In hands   Erythromycin    REACTION:Increases tegretol level   Nsaids Other (See Comments)   REACTION: Cannot take due to kidney health   Penicillins    REACTION: Unknown        Medication List        Accurate as of April 25, 2022 12:02 PM. If you have any questions, ask your nurse or doctor.          amLODipine 5 MG tablet Commonly known as: NORVASC TAKE ONE TABLET ONCE DAILY EVERY EVENING   carbamazepine 200 MG tablet Commonly known as: TEGretol TAKE (1/2)  TABLET FOUR TIMES DAILY.   chlorproMAZINE 100 MG tablet Commonly known as: THORAZINE TAKE ONE TABLET FOUR TIMES DAILY   clobetasol 0.05 % external solution Commonly known as: TEMOVATE APPLY TOPICALLY 2 TIMES A DAY   diclofenac Sodium 1 % Gel Commonly known as: Voltaren Apply 2 g topically 4 (four) times daily. What changed:  when to take this reasons to take this   esomeprazole 40 MG capsule Commonly known as: NEXIUM TAKE ONE CAPSULE ONCE DAILY   famotidine 20 MG tablet Commonly known as: PEPCID TAKE ONE TABLET AT BEDTIME   ferrous sulfate 325 (65 FE) MG EC tablet Commonly known as: Fe Tabs Take 1 tablet (325 mg total) by mouth daily with breakfast.   fluticasone 50 MCG/ACT nasal spray Commonly known as: FLONASE 2 SPRAYS IN EACH NOSTRIL ONCE A DAY AS NEEDED  LORazepam 1 MG tablet Commonly known as: ATIVAN TAKE 1 TABLET 4 TIMES A DAY. MAY TAKE 1 EXTRA TABLET DAILY IF NEEDED FOR PANIC   losartan 100 MG tablet Commonly known as: COZAAR TAKE ONE TABLET ONCE DAILY EVERY MORNING   trihexyphenidyl 2 MG tablet Commonly known as: ARTANE TAKE (1) TABLET TWICE A DAY WITH MEALS (BREAKFAST AND SUPPER)   Vitamin D (Ergocalciferol) 1.25 MG (50000 UNIT) Caps capsule Commonly known as: DRISDOL Take 1 capsule (50,000 Units total) by mouth every 7 (seven) days. Started by: Claretta Fraise, MD        Meds ordered this encounter  Medications   Vitamin D, Ergocalciferol, (DRISDOL) 1.25 MG (50000 UNIT) CAPS capsule    Sig: Take 1 capsule (50,000 Units total) by mouth every 7 (seven) days.    Dispense:  13 capsule    Refill:  3      Follow-up: Return in about 6 months (around 10/24/2022) for [facility] physical.  Claretta Fraise, M.D.

## 2022-04-26 ENCOUNTER — Telehealth: Payer: Self-pay | Admitting: Family Medicine

## 2022-04-26 NOTE — Telephone Encounter (Signed)
It does not cause constipation. It does keep bones strong

## 2022-04-26 NOTE — Telephone Encounter (Signed)
Patient was prescribed Vitamin D, Ergocalciferol, (DRISDOL) 1.25 MG (50000 UNIT) CAPS capsule but was told that it can cause issues with constipation. He already has issues with this and wants to talk to nurse about it. Please call back.

## 2022-04-26 NOTE — Telephone Encounter (Signed)
See below, can Vitamin D cause constipation?

## 2022-04-27 ENCOUNTER — Telehealth: Payer: Self-pay | Admitting: Psychiatry

## 2022-04-27 NOTE — Telephone Encounter (Signed)
Attempted to call pt , busy

## 2022-04-27 NOTE — Telephone Encounter (Signed)
Not needed

## 2022-04-28 NOTE — Telephone Encounter (Signed)
Returned call, not available, left message to return call

## 2022-05-02 NOTE — Telephone Encounter (Signed)
Called patient,busy signal.

## 2022-05-03 NOTE — Telephone Encounter (Signed)
Contacted patient. His constipation has resolved.

## 2022-05-09 ENCOUNTER — Ambulatory Visit: Payer: Medicare Other

## 2022-05-11 ENCOUNTER — Ambulatory Visit: Payer: Medicare Other

## 2022-05-11 ENCOUNTER — Ambulatory Visit (INDEPENDENT_AMBULATORY_CARE_PROVIDER_SITE_OTHER): Payer: Medicare Other

## 2022-05-11 DIAGNOSIS — E538 Deficiency of other specified B group vitamins: Secondary | ICD-10-CM | POA: Diagnosis not present

## 2022-05-11 MED ORDER — CYANOCOBALAMIN 1000 MCG/ML IJ SOLN
1000.0000 ug | INTRAMUSCULAR | Status: AC
  Administered 2022-05-11 – 2023-04-21 (×12): 1000 ug via INTRAMUSCULAR

## 2022-05-11 NOTE — Progress Notes (Signed)
Cyanocobalamin injection given to right deltoid.  Patient tolerated well. 

## 2022-05-31 ENCOUNTER — Other Ambulatory Visit: Payer: Self-pay | Admitting: Family Medicine

## 2022-06-01 ENCOUNTER — Telehealth: Payer: Self-pay | Admitting: Family Medicine

## 2022-06-01 NOTE — Telephone Encounter (Signed)
Patient is having abdominal pain for several days.  It began after he started taking Doxycycline given to him by the dentist but he is also taking a vitamin d and is concerned it may be coming from that.  He also is worried about his kidneys and wanted to schedule an appointment to be seen to discuss this.  Appointment scheduled tomorrow, 06/02/22, with DOD at 11:05 am.

## 2022-06-01 NOTE — Telephone Encounter (Signed)
Patient thinks that the Vitamin D, Ergocalciferol, (DRISDOL) 1.25 MG (50000 UNIT) CAPS capsule is causing him to have stomach issues. He stated that this is stronger than the one that he was taking before and one of the side effects on the prescription is stomach pain so he would like to speak to nurse about it.

## 2022-06-01 NOTE — Telephone Encounter (Signed)
Patient thinks that the Vitamin D, Ergocalciferol, (DRISDOL) 1.25 MG (50000 UNIT) CAPS capsule is causing him to have stomach issues. He stated that this is stronger than the one that he was taking before and one of the side effects on the prescription is

## 2022-06-02 ENCOUNTER — Encounter: Payer: Self-pay | Admitting: Family

## 2022-06-02 ENCOUNTER — Ambulatory Visit (INDEPENDENT_AMBULATORY_CARE_PROVIDER_SITE_OTHER): Payer: Medicare Other | Admitting: Family

## 2022-06-02 VITALS — BP 113/66 | HR 78 | Temp 97.4°F | Ht 76.0 in | Wt 169.0 lb

## 2022-06-02 DIAGNOSIS — R1084 Generalized abdominal pain: Secondary | ICD-10-CM

## 2022-06-02 DIAGNOSIS — K219 Gastro-esophageal reflux disease without esophagitis: Secondary | ICD-10-CM

## 2022-06-02 NOTE — Progress Notes (Signed)
Subjective:    Patient ID: Matthew [name], male    DOB: 1950-10-09, 72 y.o.   MRN: [id number]  Chief Complaint  Patient presents with   Abdominal Pain    Started when he took doxy for a tooth problem and that is when pain started.    PT presents to the office today for abdominal pain that starts after he takes doxycycline.   He started the doxycyline for a dental abscess. He will complete this tomorrow.   Denies any fever, dysuria, or N&V.  Abdominal Pain This is a new problem. The current episode started yesterday. The onset quality is gradual. The pain is located in the periumbilical region. The pain is at a severity of 7/10. The pain is mild. The quality of the pain is cramping. Pertinent negatives include no belching, diarrhea, dysuria, fever, flatus, nausea or vomiting. Exacerbated by: after taking doxycyline. He has tried nothing for the symptoms. The treatment provided no relief. His past medical history is significant for GERD.  Gastroesophageal Reflux He complains of abdominal pain and heartburn. He reports no belching or no nausea. This is a chronic problem. The current episode started more than 1 year ago. The problem occurs rarely. He has tried a PPI for the symptoms. The treatment provided moderate relief.      Review of Systems  Constitutional:  Negative for fever.  Gastrointestinal:  Positive for abdominal pain and heartburn. Negative for diarrhea, flatus, nausea and vomiting.  Genitourinary:  Negative for dysuria.  All other systems reviewed and are negative.      Objective:   Physical Exam Vitals reviewed.  Constitutional:      General: He is not in acute distress.    Appearance: He is well-developed.  HENT:     Head: Normocephalic.     Right Ear: External ear normal.     Left Ear: External ear normal.  Eyes:     General:        Right eye: No discharge.        Left eye: No discharge.     Pupils: Pupils are equal, round, and reactive to light.  Neck:      Thyroid: No thyromegaly.  Cardiovascular:     Rate and Rhythm: Normal rate and regular rhythm.     Heart sounds: Normal heart sounds. No murmur heard. Pulmonary:     Effort: Pulmonary effort is normal. No respiratory distress.     Breath sounds: Normal breath sounds. No wheezing.  Abdominal:     General: Bowel sounds are normal. There is no distension.     Palpations: Abdomen is soft.     Tenderness: There is abdominal tenderness (mild lower abdominal  pain).  Musculoskeletal:        General: No tenderness. Normal range of motion.     Cervical back: Normal range of motion and neck supple.  Skin:    General: Skin is warm and dry.     Findings: No erythema or rash.  Neurological:     Mental Status: He is alert and oriented to person, place, and time.     Cranial Nerves: No cranial nerve deficit.     Deep Tendon Reflexes: Reflexes are normal and symmetric.  Psychiatric:        Behavior: Behavior normal.        Thought Content: Thought content normal.        Judgment: Judgment normal.      BP 113/66   Pulse 78  Temp (!) 97.4 F (36.3 C) (Temporal)   Ht '6\' 4"'$  (1.93 m)   Wt 169 lb (76.7 kg)   SpO2 96%   BMI 20.57 kg/m       Assessment & Plan:  Matthew [name] comes in today with chief complaint of Abdominal Pain (Started when he took doxy for a tooth problem and that is when pain started. )   Diagnosis and orders addressed:  1. Generalized abdominal pain - CBC with Differential/Platelet - BMP8+EGFR  2. Gastroesophageal reflux disease, unspecified whether esophagitis present -Diet discussed- Avoid fried, spicy, citrus foods, caffeine and alcohol -Do not eat 2-3 hours before bedtime -Encouraged small frequent meals -Avoid NSAID's    Abdominal pain improving now.  Take doxycyline with food Possible viral, GERD flare up Force fluids Follow up if symptoms worsen or do not improve   Evelina Dun, FNP

## 2022-06-02 NOTE — Patient Instructions (Signed)

## 2022-06-03 ENCOUNTER — Other Ambulatory Visit: Payer: Self-pay | Admitting: Family

## 2022-06-03 LAB — CBC WITH DIFFERENTIAL/PLATELET
Basophils Absolute: 0.1 10*3/uL (ref 0.0–0.2)
Basos: 1 %
EOS (ABSOLUTE): 0.1 10*3/uL (ref 0.0–0.4)
Eos: 2 %
Hematocrit: 34.2 % — ABNORMAL LOW (ref 37.5–51.0)
Hemoglobin: 11.8 g/dL — ABNORMAL LOW (ref 13.0–17.7)
Immature Grans (Abs): 0 10*3/uL (ref 0.0–0.1)
Immature Granulocytes: 0 %
Lymphocytes Absolute: 0.8 10*3/uL (ref 0.7–3.1)
Lymphs: 16 %
MCH: 30.6 pg (ref 26.6–33.0)
MCHC: 34.5 g/dL (ref 31.5–35.7)
MCV: 89 fL (ref 79–97)
Monocytes Absolute: 0.5 10*3/uL (ref 0.1–0.9)
Monocytes: 9 %
Neutrophils Absolute: 3.5 10*3/uL (ref 1.4–7.0)
Neutrophils: 72 %
Platelets: 219 10*3/uL (ref 150–450)
RBC: 3.86 x10E6/uL — ABNORMAL LOW (ref 4.14–5.80)
RDW: 12.3 % (ref 11.6–15.4)
WBC: 4.9 10*3/uL (ref 3.4–10.8)

## 2022-06-03 LAB — BMP8+EGFR
BUN/Creatinine Ratio: 15 (ref 10–24)
BUN: 26 mg/dL (ref 8–27)
CO2: 23 mmol/L (ref 20–29)
Calcium: 9.5 mg/dL (ref 8.6–10.2)
Chloride: 101 mmol/L (ref 96–106)
Creatinine, Ser: 1.74 mg/dL — ABNORMAL HIGH (ref 0.76–1.27)
Glucose: 98 mg/dL (ref 70–99)
Potassium: 4.6 mmol/L (ref 3.5–5.2)
Sodium: 138 mmol/L (ref 134–144)
eGFR: 42 mL/min/{1.73_m2} — ABNORMAL LOW (ref >59)

## 2022-06-08 ENCOUNTER — Encounter: Payer: Self-pay | Admitting: Family Medicine

## 2022-06-09 ENCOUNTER — Ambulatory Visit (INDEPENDENT_AMBULATORY_CARE_PROVIDER_SITE_OTHER): Payer: Medicare Other

## 2022-06-09 DIAGNOSIS — E538 Deficiency of other specified B group vitamins: Secondary | ICD-10-CM | POA: Diagnosis not present

## 2022-06-09 NOTE — Progress Notes (Signed)
Cyanocobalamin injection given to left deltoid.  Patient tolerated well. 

## 2022-06-10 ENCOUNTER — Telehealth: Payer: Self-pay | Admitting: Family Medicine

## 2022-06-10 NOTE — Telephone Encounter (Signed)
NA

## 2022-06-13 ENCOUNTER — Ambulatory Visit: Payer: Medicare Other

## 2022-06-13 NOTE — Telephone Encounter (Signed)
PATIENT CANCELLED THIS APPOINTMENT

## 2022-06-22 ENCOUNTER — Ambulatory Visit: Payer: Medicare Other | Admitting: Family Medicine

## 2022-06-26 ENCOUNTER — Other Ambulatory Visit: Payer: Self-pay | Admitting: Family Medicine

## 2022-06-26 DIAGNOSIS — K2100 Gastro-esophageal reflux disease with esophagitis, without bleeding: Secondary | ICD-10-CM

## 2022-06-26 DIAGNOSIS — I10 Essential (primary) hypertension: Secondary | ICD-10-CM

## 2022-06-30 ENCOUNTER — Ambulatory Visit (INDEPENDENT_AMBULATORY_CARE_PROVIDER_SITE_OTHER): Payer: Medicare Other | Admitting: Psychiatry

## 2022-06-30 ENCOUNTER — Encounter: Payer: Self-pay | Admitting: Psychiatry

## 2022-06-30 DIAGNOSIS — F259 Schizoaffective disorder, unspecified: Secondary | ICD-10-CM

## 2022-06-30 DIAGNOSIS — G3184 Mild cognitive impairment, so stated: Secondary | ICD-10-CM

## 2022-06-30 DIAGNOSIS — F4001 Agoraphobia with panic disorder: Secondary | ICD-10-CM

## 2022-06-30 DIAGNOSIS — G2119 Other drug induced secondary parkinsonism: Secondary | ICD-10-CM

## 2022-06-30 DIAGNOSIS — Z87820 Personal history of traumatic brain injury: Secondary | ICD-10-CM

## 2022-06-30 DIAGNOSIS — F411 Generalized anxiety disorder: Secondary | ICD-10-CM | POA: Diagnosis not present
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
## 2022-07-11 ENCOUNTER — Ambulatory Visit (INDEPENDENT_AMBULATORY_CARE_PROVIDER_SITE_OTHER): Payer: Medicare Other

## 2022-07-11 DIAGNOSIS — E538 Deficiency of other specified B group vitamins: Secondary | ICD-10-CM | POA: Diagnosis not present

## 2022-07-11 NOTE — Progress Notes (Signed)
Cyanocobalamin injection given to right deltoid.  Patient tolerated well. 

## 2022-07-13 ENCOUNTER — Telehealth: Payer: Self-pay | Admitting: Family Medicine

## 2022-07-13 NOTE — Telephone Encounter (Signed)
He should be fine to take tylenol according to label directions

## 2022-07-13 NOTE — Telephone Encounter (Signed)
DO YOU KNOW OF ANY CLINICAL REASON WHY THIS PATIENT SHOULD REFRAIN FROM TYLENOL USE?

## 2022-07-13 NOTE — Telephone Encounter (Signed)
Patient aware.

## 2022-08-01 ENCOUNTER — Ambulatory Visit (INDEPENDENT_AMBULATORY_CARE_PROVIDER_SITE_OTHER): Payer: Medicare Other | Admitting: Family Medicine

## 2022-08-01 ENCOUNTER — Encounter: Payer: Self-pay | Admitting: Family Medicine

## 2022-08-01 VITALS — BP 137/74 | HR 72 | Temp 98.7°F | Ht 76.0 in | Wt 170.0 lb

## 2022-08-01 DIAGNOSIS — N1832 Chronic kidney disease, stage 3b: Secondary | ICD-10-CM

## 2022-08-01 DIAGNOSIS — N410 Acute prostatitis: Secondary | ICD-10-CM

## 2022-08-01 LAB — URINALYSIS, ROUTINE W REFLEX MICROSCOPIC
Bilirubin, UA: NEGATIVE
Glucose, UA: NEGATIVE
Ketones, UA: NEGATIVE
Leukocytes,UA: NEGATIVE
Nitrite, UA: NEGATIVE
Protein,UA: NEGATIVE
RBC, UA: NEGATIVE
Specific Gravity, UA: 1.01 (ref 1.005–1.030)
Urobilinogen, Ur: 0.2 mg/dL (ref 0.2–1.0)
pH, UA: 6.5 (ref 5.0–7.5)
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
## 2022-08-02 ENCOUNTER — Telehealth: Payer: Self-pay | Admitting: Family Medicine

## 2022-08-03 ENCOUNTER — Ambulatory Visit: Payer: Medicare Other

## 2022-08-03 LAB — URINE CULTURE: Organism ID, Bacteria: NO GROWTH

## 2022-08-11 ENCOUNTER — Ambulatory Visit (INDEPENDENT_AMBULATORY_CARE_PROVIDER_SITE_OTHER): Payer: Medicare Other

## 2022-08-11 DIAGNOSIS — E538 Deficiency of other specified B group vitamins: Secondary | ICD-10-CM

## 2022-08-11 NOTE — Progress Notes (Signed)
B12 injection given to patient and tolerated well.  

## 2022-08-17 ENCOUNTER — Other Ambulatory Visit: Payer: Self-pay | Admitting: Psychiatry

## 2022-08-23 ENCOUNTER — Encounter: Payer: Self-pay | Admitting: Family Medicine

## 2022-08-23 ENCOUNTER — Ambulatory Visit (INDEPENDENT_AMBULATORY_CARE_PROVIDER_SITE_OTHER): Payer: Medicare Other | Admitting: Family Medicine

## 2022-08-23 VITALS — BP 130/58 | HR 74 | Temp 97.8°F | Ht 76.0 in | Wt 173.2 lb

## 2022-08-23 DIAGNOSIS — R300 Dysuria: Secondary | ICD-10-CM | POA: Diagnosis not present

## 2022-08-23 DIAGNOSIS — M545 Low back pain, unspecified: Secondary | ICD-10-CM

## 2022-08-23 MED ORDER — BETAMETHASONE SOD PHOS & ACET 6 (3-3) MG/ML IJ SUSP
6.0000 mg | Freq: Once | INTRAMUSCULAR | Status: AC
Start: 2022-08-23 — End: 2022-08-23
  Administered 2022-08-23: 6 mg via INTRAMUSCULAR

## 2022-08-23 NOTE — Progress Notes (Signed)
Subjective:  Patient ID: Matthew [name], male    DOB: 1950-04-19  Age: 72 y.o. MRN: [id number]  CC: Back Pain and Dysuria   HPI Matthew [name] presents for back pain. Hurts when he gets out of bed. Also when he stands and starts walking from a seated position.Onset a few months. Had surgery on lumbar back in 2013. Surgeon has left the area. Pain is moderately severe. Concerned that he has another herniated disk. Some pain at left lumbar as ewll. Pain also noted at distal anterior right thigh.      08/23/2022    4:31 PM 08/01/2022   11:44 AM 06/02/2022   10:57 AM  Depression screen PHQ 2/9  Decreased Interest 0 1 3  Down, Depressed, Hopeless 0 0 0  PHQ - 2 Score 0 1 3  Altered sleeping 0 2 3  Tired, decreased energy 0 0 0  Change in appetite 0 0 0  Feeling bad or failure about yourself  0 0 0  Trouble concentrating 1 0 0  Moving slowly or fidgety/restless 0 0 0  Suicidal thoughts 0 0 0  PHQ-9 Score 1 3 6   Difficult doing work/chores Not difficult at all Not difficult at all Not difficult at all    History Matthew [name] has a past medical history of Agoraphobia, Anemia, Anxiety, Arthritis, Asthma, Barrett esophagus, Blood transfusion without reported diagnosis, Bronchitis, Chronic kidney disease, Complication of anesthesia, Educated about COVID-19 virus infection (12/02/2019), Elevated serum creatinine, Esophageal stricture, GERD (gastroesophageal reflux disease), Hypertension, Mood disorder (HCC), Neck pain, chronic, Neuromuscular disorder (HCC), Palpitations (12/02/2019), Rosacea, and Vitamin B12 deficiency.   He has a past surgical history that includes Chest tube insertion; Fracture surgery; Wisdom tooth extraction; Lumbar laminectomy/decompression microdiscectomy (12/06/2011); Colonoscopy (08/20/2003); Upper gastrointestinal endoscopy (03/15/2013); and skin cancer removal from forehead.   His family history includes Diabetes in his mother; Heart disease in his mother; Stroke in his mother.He  reports that he quit smoking about 37 years ago. His smoking use included cigarettes. He has a 0.90 pack-year smoking history. He has never used smokeless tobacco. He reports that he does not drink alcohol and does not use drugs.    ROS Review of Systems  Objective:  BP (!) 130/58   Pulse 74   Temp 97.8 F (36.6 C)   Ht 6\' 4"  (1.93 m)   Wt 173 lb 3.2 oz (78.6 kg)   SpO2 98%   BMI 21.08 kg/m   BP Readings from Last 3 Encounters:  08/23/22 (!) 130/58  08/01/22 137/74  06/02/22 113/66    Wt Readings from Last 3 Encounters:  08/23/22 173 lb 3.2 oz (78.6 kg)  08/01/22 170 lb (77.1 kg)  06/02/22 169 lb (76.7 kg)     Physical Exam    Assessment & Plan:   Matthew [name] was seen today for back pain and dysuria.  Diagnoses and all orders for this visit:  Dysuria -     Urinalysis, Complete  Right lumbar pain -     Ambulatory referral to Neurosurgery -     DG Lumbar Spine 2-3 Views       I am having Matthew [name] maintain his diclofenac Sodium, fluticasone, clobetasol, ferrous sulfate, carbamazepine, trihexyphenidyl, chlorproMAZINE, Vitamin D (Ergocalciferol), famotidine, doxycycline, losartan, esomeprazole, amLODipine, and LORazepam. We will continue to administer cyanocobalamin.  Allergies as of 08/23/2022       Reactions   Acetaminophen Nausea And Vomiting   Amoxicillin    REACTION: unspecified/unknown   Cephalexin Swelling  In hands   Erythromycin    REACTION:Increases tegretol level   Nsaids Other (See Comments)   REACTION: Cannot take due to kidney health   Penicillins    REACTION: Unknown        Medication List        Accurate as of Aug 23, 2022  4:55 PM. If you have any questions, ask your nurse or doctor.          amLODipine 5 MG tablet Commonly known as: NORVASC TAKE ONE TABLET ONCE DAILY EVERY EVENING   carbamazepine 200 MG tablet Commonly known as: TEGretol TAKE (1/2) TABLET FOUR TIMES DAILY.   chlorproMAZINE 100 MG  tablet Commonly known as: THORAZINE TAKE ONE TABLET FOUR TIMES DAILY   clobetasol 0.05 % external solution Commonly known as: TEMOVATE APPLY TOPICALLY 2 TIMES A DAY   diclofenac Sodium 1 % Gel Commonly known as: Voltaren Apply 2 g topically 4 (four) times daily. What changed:  when to take this reasons to take this   doxycycline 100 MG tablet Commonly known as: VIBRA-TABS Take 100 mg by mouth 2 (two) times daily.   esomeprazole 40 MG capsule Commonly known as: NEXIUM TAKE ONE CAPSULE ONCE DAILY   famotidine 20 MG tablet Commonly known as: PEPCID TAKE ONE TABLET AT BEDTIME   ferrous sulfate 325 (65 FE) MG EC tablet Commonly known as: Fe Tabs Take 1 tablet (325 mg total) by mouth daily with breakfast.   fluticasone 50 MCG/ACT nasal spray Commonly known as: FLONASE 2 SPRAYS IN EACH NOSTRIL ONCE A DAY AS NEEDED   LORazepam 1 MG tablet Commonly known as: ATIVAN TAKE ONE TABLET FOUR TIMES DAILY (MAY TAKE 1 EXTRA DAILY IF NEEDED FOR PANIC)   losartan 100 MG tablet Commonly known as: COZAAR TAKE ONE TABLET ONCE DAILY EVERY MORNING   trihexyphenidyl 2 MG tablet Commonly known as: ARTANE TAKE (1) TABLET TWICE A DAY WITH MEALS (BREAKFAST AND SUPPER)   Vitamin D (Ergocalciferol) 1.25 MG (50000 UNIT) Caps capsule Commonly known as: DRISDOL Take 1 capsule (50,000 Units total) by mouth every 7 (seven) days.         Follow-up: No follow-ups on file.  Mechele Claude, M.D.

## 2022-08-24 ENCOUNTER — Encounter: Payer: Self-pay | Admitting: Family Medicine

## 2022-08-24 LAB — URINALYSIS, COMPLETE
Bilirubin, UA: NEGATIVE
Glucose, UA: NEGATIVE
Ketones, UA: NEGATIVE
Leukocytes,UA: NEGATIVE
Nitrite, UA: NEGATIVE
Protein,UA: NEGATIVE
RBC, UA: NEGATIVE
Specific Gravity, UA: <1.005 — ABNORMAL LOW (ref 1.005–1.030)
Urobilinogen, Ur: 0.2 mg/dL (ref 0.2–1.0)
pH, UA: 6 (ref 5.0–7.5)

## 2022-08-24 LAB — MICROSCOPIC EXAMINATION

## 2022-08-25 ENCOUNTER — Ambulatory Visit: Payer: Medicare Other | Admitting: Family Medicine

## 2022-08-25 ENCOUNTER — Telehealth: Payer: Self-pay | Admitting: Psychiatry

## 2022-08-25 NOTE — Telephone Encounter (Signed)
Pt brought in jury duty summons. Requesting letter from [employer]. Placed in CC box outside his door.

## 2022-09-01 ENCOUNTER — Encounter: Payer: Self-pay | Admitting: Family Medicine

## 2022-09-01 ENCOUNTER — Ambulatory Visit (INDEPENDENT_AMBULATORY_CARE_PROVIDER_SITE_OTHER): Payer: Medicare Other | Admitting: Family Medicine

## 2022-09-01 VITALS — BP 144/68 | HR 83 | Temp 97.3°F | Ht 76.0 in | Wt 167.4 lb

## 2022-09-01 DIAGNOSIS — H6123 Impacted cerumen, bilateral: Secondary | ICD-10-CM

## 2022-09-01 NOTE — Patient Instructions (Signed)
Debrox Drops nightly
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
## 2022-09-05 ENCOUNTER — Other Ambulatory Visit (HOSPITAL_COMMUNITY): Payer: Self-pay | Admitting: Neurosurgery

## 2022-09-05 DIAGNOSIS — M5416 Radiculopathy, lumbar region: Secondary | ICD-10-CM

## 2022-09-12 ENCOUNTER — Ambulatory Visit (INDEPENDENT_AMBULATORY_CARE_PROVIDER_SITE_OTHER): Payer: Medicare Other

## 2022-09-12 DIAGNOSIS — E538 Deficiency of other specified B group vitamins: Secondary | ICD-10-CM | POA: Diagnosis not present

## 2022-09-12 NOTE — Progress Notes (Signed)
Cyanocobalamin injection given to right deltoid.  Patient tolerated well. 

## 2022-09-15 ENCOUNTER — Ambulatory Visit: Payer: Medicare Other | Admitting: Family Medicine

## 2022-09-19 ENCOUNTER — Ambulatory Visit: Payer: Medicare Other | Admitting: Family Medicine

## 2022-09-22 ENCOUNTER — Other Ambulatory Visit: Payer: Self-pay | Admitting: Family Medicine

## 2022-09-22 DIAGNOSIS — I10 Essential (primary) hypertension: Secondary | ICD-10-CM

## 2022-09-22 DIAGNOSIS — K2100 Gastro-esophageal reflux disease with esophagitis, without bleeding: Secondary | ICD-10-CM

## 2022-09-23 ENCOUNTER — Other Ambulatory Visit: Payer: Self-pay | Admitting: Psychiatry

## 2022-09-23 ENCOUNTER — Ambulatory Visit (HOSPITAL_COMMUNITY)
Admission: RE | Admit: 2022-09-23 | Discharge: 2022-09-23 | Disposition: A | Discharge status: Home / Self Care | Payer: Medicare Other | Source: Ambulatory Visit | Attending: Neurosurgery | Admitting: Neurosurgery

## 2022-09-23 DIAGNOSIS — M5416 Radiculopathy, lumbar region: Secondary | ICD-10-CM | POA: Insufficient documentation

## 2022-09-23 DIAGNOSIS — F259 Schizoaffective disorder, unspecified: Secondary | ICD-10-CM

## 2022-09-23 DIAGNOSIS — G2119 Other drug induced secondary parkinsonism: Secondary | ICD-10-CM

## 2022-09-30 ENCOUNTER — Ambulatory Visit (HOSPITAL_COMMUNITY): Payer: Medicare Other

## 2022-10-17 ENCOUNTER — Telehealth: Payer: Self-pay | Admitting: Family Medicine

## 2022-10-17 ENCOUNTER — Other Ambulatory Visit: Payer: Self-pay | Admitting: *Deleted

## 2022-10-17 ENCOUNTER — Ambulatory Visit (INDEPENDENT_AMBULATORY_CARE_PROVIDER_SITE_OTHER): Payer: Medicare Other

## 2022-10-17 DIAGNOSIS — I10 Essential (primary) hypertension: Secondary | ICD-10-CM

## 2022-10-17 DIAGNOSIS — E538 Deficiency of other specified B group vitamins: Secondary | ICD-10-CM

## 2022-10-17 DIAGNOSIS — D509 Iron deficiency anemia, unspecified: Secondary | ICD-10-CM

## 2022-10-17 DIAGNOSIS — E782 Mixed hyperlipidemia: Secondary | ICD-10-CM

## 2022-10-17 NOTE — Progress Notes (Signed)
B12 given in right deltoid without difficulty. Pt has no concerns today.

## 2022-10-17 NOTE — Telephone Encounter (Signed)
Labs ordered.

## 2022-10-19 ENCOUNTER — Other Ambulatory Visit: Payer: Medicare Other

## 2022-10-19 DIAGNOSIS — E782 Mixed hyperlipidemia: Secondary | ICD-10-CM

## 2022-10-19 DIAGNOSIS — I10 Essential (primary) hypertension: Secondary | ICD-10-CM

## 2022-10-19 DIAGNOSIS — D509 Iron deficiency anemia, unspecified: Secondary | ICD-10-CM

## 2022-10-19 LAB — CBC WITH DIFFERENTIAL/PLATELET
Basophils Absolute: 0.1 10*3/uL (ref 0.0–0.2)
EOS (ABSOLUTE): 0.2 10*3/uL (ref 0.0–0.4)
Eos: 3 %
Hematocrit: 35.9 % — ABNORMAL LOW (ref 37.5–51.0)
Immature Grans (Abs): 0 10*3/uL (ref 0.0–0.1)
Immature Granulocytes: 0 %
Lymphs: 15 %
MCH: 28.9 pg (ref 26.6–33.0)
Monocytes: 9 %
Neutrophils Absolute: 4.6 10*3/uL (ref 1.4–7.0)
Neutrophils: 72 %
RDW: 12.5 % (ref 11.6–15.4)

## 2022-10-19 LAB — CMP14+EGFR
ALT: 22 IU/L (ref 0–44)
Albumin: 4.2 g/dL (ref 3.9–4.9)
BUN/Creatinine Ratio: 17 (ref 10–24)
BUN: 31 mg/dL — ABNORMAL HIGH (ref 8–27)
Chloride: 105 mmol/L (ref 96–106)
Creatinine, Ser: 1.82 mg/dL — ABNORMAL HIGH (ref 0.76–1.27)
Globulin, Total: 2.7 g/dL (ref 1.5–4.5)
Potassium: 5.1 mmol/L (ref 3.5–5.2)
Sodium: 139 mmol/L (ref 134–144)

## 2022-10-19 LAB — IRON,TIBC AND FERRITIN PANEL

## 2022-10-19 LAB — LIPID PANEL: HDL: 66 mg/dL (ref >39)

## 2022-10-20 LAB — CBC WITH DIFFERENTIAL/PLATELET
Basos: 1 %
Hemoglobin: 11.6 g/dL — ABNORMAL LOW (ref 13.0–17.7)
Lymphocytes Absolute: 1 10*3/uL (ref 0.7–3.1)
MCHC: 32.3 g/dL (ref 31.5–35.7)
MCV: 90 fL (ref 79–97)
Monocytes Absolute: 0.6 10*3/uL (ref 0.1–0.9)
Platelets: 202 10*3/uL (ref 150–450)
RBC: 4.01 x10E6/uL — ABNORMAL LOW (ref 4.14–5.80)
WBC: 6.5 10*3/uL (ref 3.4–10.8)

## 2022-10-20 LAB — CMP14+EGFR
AST: 19 IU/L (ref 0–40)
Alkaline Phosphatase: 137 IU/L — ABNORMAL HIGH (ref 44–121)
Bilirubin Total: 0.3 mg/dL (ref 0.0–1.2)
CO2: 24 mmol/L (ref 20–29)
Calcium: 9.7 mg/dL (ref 8.6–10.2)
Glucose: 97 mg/dL (ref 70–99)
Total Protein: 6.9 g/dL (ref 6.0–8.5)
eGFR: 39 mL/min/{1.73_m2} — ABNORMAL LOW (ref >59)

## 2022-10-20 LAB — IRON,TIBC AND FERRITIN PANEL
Iron: 109 ug/dL (ref 38–169)
Total Iron Binding Capacity: 242 ug/dL — ABNORMAL LOW (ref 250–450)

## 2022-10-20 LAB — LIPID PANEL
Chol/HDL Ratio: 2.4 ratio (ref 0.0–5.0)
Cholesterol, Total: 158 mg/dL (ref 100–199)
LDL Chol Calc (NIH): 80 mg/dL (ref 0–99)
Triglycerides: 60 mg/dL (ref 0–149)
VLDL Cholesterol Cal: 12 mg/dL (ref 5–40)

## 2022-10-24 ENCOUNTER — Ambulatory Visit: Payer: Medicare Other | Admitting: Family Medicine

## 2022-10-25 ENCOUNTER — Encounter: Payer: Self-pay | Admitting: Family Medicine

## 2022-10-25 ENCOUNTER — Ambulatory Visit (INDEPENDENT_AMBULATORY_CARE_PROVIDER_SITE_OTHER): Payer: Medicare Other | Admitting: Family Medicine

## 2022-10-25 VITALS — BP 99/56 | HR 77 | Temp 97.5°F | Ht 76.0 in | Wt 169.0 lb

## 2022-10-25 DIAGNOSIS — Z0000 Encounter for general adult medical examination without abnormal findings: Secondary | ICD-10-CM

## 2022-10-25 DIAGNOSIS — K2100 Gastro-esophageal reflux disease with esophagitis, without bleeding: Secondary | ICD-10-CM

## 2022-10-25 DIAGNOSIS — D509 Iron deficiency anemia, unspecified: Secondary | ICD-10-CM | POA: Diagnosis not present

## 2022-10-25 DIAGNOSIS — R790 Abnormal level of blood mineral: Secondary | ICD-10-CM

## 2022-10-25 DIAGNOSIS — I10 Essential (primary) hypertension: Secondary | ICD-10-CM

## 2022-10-25 DIAGNOSIS — E782 Mixed hyperlipidemia: Secondary | ICD-10-CM | POA: Diagnosis not present

## 2022-10-25 DIAGNOSIS — E559 Vitamin D deficiency, unspecified: Secondary | ICD-10-CM

## 2022-10-25 DIAGNOSIS — N401 Enlarged prostate with lower urinary tract symptoms: Secondary | ICD-10-CM

## 2022-10-25 DIAGNOSIS — R3911 Hesitancy of micturition: Secondary | ICD-10-CM

## 2022-10-25 DIAGNOSIS — F259 Schizoaffective disorder, unspecified: Secondary | ICD-10-CM

## 2022-10-25 LAB — URINALYSIS
Bilirubin, UA: NEGATIVE
Glucose, UA: NEGATIVE
Ketones, UA: NEGATIVE
Leukocytes,UA: NEGATIVE
Nitrite, UA: NEGATIVE
Protein,UA: NEGATIVE
RBC, UA: NEGATIVE
Specific Gravity, UA: 1.01 (ref 1.005–1.030)
Urobilinogen, Ur: 0.2 mg/dL (ref 0.2–1.0)
pH, UA: 6 (ref 5.0–7.5)

## 2022-10-25 MED ORDER — LOSARTAN POTASSIUM 100 MG PO TABS: ORAL_TABLET | ORAL | 3 refills | Status: DC

## 2022-10-25 MED ORDER — ESOMEPRAZOLE MAGNESIUM 40 MG PO CPDR: DELAYED_RELEASE_CAPSULE | ORAL | 3 refills | Status: DC

## 2022-10-25 MED ORDER — AMLODIPINE BESYLATE 5 MG PO TABS
ORAL_TABLET | ORAL | 3 refills | Status: DC
Start: 2022-10-25 — End: 2023-12-04

## 2022-10-25 MED ORDER — FERROUS SULFATE 325 (65 FE) MG PO TBEC: 325.0000 mg | DELAYED_RELEASE_TABLET | Freq: Every day | ORAL | 3 refills | Status: DC

## 2022-10-25 MED ORDER — FAMOTIDINE 20 MG PO TABS: 20.0000 mg | ORAL_TABLET | Freq: Every day | ORAL | 3 refills | Status: DC

## 2022-10-25 NOTE — Progress Notes (Signed)
Subjective:  Patient ID: Matthew [name], male    DOB: 11-21-1950  Age: 72 y.o. MRN: [id number]  CC: Annual Exam   HPI Ric C Locken presents for  presents for  follow-up of hypertension. Patient has no history of headache chest pain or shortness of breath or recent cough. Patient also denies symptoms of TIA such as focal numbness or weakness. Patient denies side effects from medication. States taking it regularly.   in for follow-up of elevated cholesterol. Doing well without complaints on current medication. Denies side effects of statin including myalgia and arthralgia and nausea. Currently no chest pain, shortness of breath or other cardiovascular related symptoms noted.  Patient in for follow-up of GERD. Currently asymptomatic taking  PPI daily. There is no chest pain or heartburn. No hematemesis and no melena. No dysphagia or choking. Onset is remote. Progression is stable. Complicating factors, none.        10/25/2022   11:01 AM 09/01/2022   11:24 AM 08/23/2022    4:31 PM  Depression screen PHQ 2/9  Decreased Interest 0 0 0  Down, Depressed, Hopeless 0 0 0  PHQ - 2 Score 0 0 0  Altered sleeping   0  Tired, decreased energy   0  Change in appetite   0  Feeling bad or failure about yourself    0  Trouble concentrating   1  Moving slowly or fidgety/restless   0  Suicidal thoughts   0  PHQ-9 Score   1  Difficult doing work/chores   Not difficult at all    History Christhoper has a past medical history of Agoraphobia, Anemia, Anxiety, Arthritis, Asthma, Barrett esophagus, Blood transfusion without reported diagnosis, Bronchitis, Chronic kidney disease, Complication of anesthesia, Educated about COVID-19 virus infection (12/02/2019), Elevated serum creatinine, Esophageal stricture, GERD (gastroesophageal reflux disease), Hypertension, Mood disorder (HCC), Neck pain, chronic, Neuromuscular disorder (HCC), Palpitations (12/02/2019), Rosacea, and Vitamin B12 deficiency.   He has a past  surgical history that includes Chest tube insertion; Fracture surgery; Wisdom tooth extraction; Lumbar laminectomy/decompression microdiscectomy (12/06/2011); Colonoscopy (08/20/2003); Upper gastrointestinal endoscopy (03/15/2013); and skin cancer removal from forehead.   His family history includes Diabetes in his mother; Heart disease in his mother; Stroke in his mother.He reports that he quit smoking about 37 years ago. His smoking use included cigarettes. He started smoking about 40 years ago. He has a 0.9 pack-year smoking history. He has never used smokeless tobacco. He reports that he does not drink alcohol and does not use drugs.    ROS Review of Systems  Constitutional:  Negative for activity change, fatigue and unexpected weight change.  HENT:  Negative for congestion, ear pain, hearing loss, postnasal drip and trouble swallowing.   Eyes:  Negative for pain and visual disturbance.  Respiratory:  Negative for cough, chest tightness and shortness of breath.   Cardiovascular:  Negative for chest pain, palpitations and leg swelling.  Gastrointestinal:  Negative for abdominal distention, abdominal pain, blood in stool, constipation, diarrhea, nausea and vomiting.  Endocrine: Negative for cold intolerance, heat intolerance and polydipsia.  Genitourinary:  Positive for difficulty urinating (hesitancy and occ. leakage). Negative for dysuria, flank pain, frequency and urgency.  Musculoskeletal:  Negative for arthralgias and joint swelling.  Skin:  Negative for color change, rash and wound.  Neurological:  Negative for dizziness, syncope, speech difficulty, weakness, light-headedness, numbness and headaches.  Hematological:  Does not bruise/bleed easily.  Psychiatric/Behavioral:  Negative for confusion, decreased concentration, dysphoric mood and sleep disturbance. The  patient is not nervous/anxious.     Objective:  BP (!) 99/56   Pulse 77   Temp (!) 97.5 F (36.4 C)   Ht 6\' 4"  (1.93 m)    Wt 169 lb (76.7 kg)   SpO2 99%   BMI 20.57 kg/m   BP Readings from Last 3 Encounters:  10/25/22 (!) 99/56  09/01/22 (!) 144/68  08/23/22 (!) 130/58    Wt Readings from Last 3 Encounters:  10/25/22 169 lb (76.7 kg)  09/01/22 167 lb 6.4 oz (75.9 kg)  08/23/22 173 lb 3.2 oz (78.6 kg)     Physical Exam Constitutional:      Appearance: He is well-developed.  HENT:     Head: Normocephalic and atraumatic.  Eyes:     Pupils: Pupils are equal, round, and reactive to light.  Neck:     Thyroid: No thyromegaly.     Trachea: No tracheal deviation.  Cardiovascular:     Rate and Rhythm: Normal rate and regular rhythm.     Heart sounds: Normal heart sounds. No murmur heard.    No friction rub. No gallop.  Pulmonary:     Breath sounds: Normal breath sounds. No wheezing or rales.  Abdominal:     General: Bowel sounds are normal. There is no distension.     Palpations: Abdomen is soft. There is no mass.     Tenderness: There is no abdominal tenderness.     Hernia: There is no hernia in the left inguinal area.  Genitourinary:    Penis: Normal.      Testes: Normal.  Musculoskeletal:        General: Normal range of motion.     Cervical back: Normal range of motion.  Lymphadenopathy:     Cervical: No cervical adenopathy.  Skin:    General: Skin is warm and dry.  Neurological:     Mental Status: He is alert and oriented to person, place, and time.       Assessment & Plan:   Luc was seen today for annual exam.  Diagnoses and all orders for this visit:  Essential hypertension -     Cancel: CBC with Differential/Platelet -     Cancel: CMP14+EGFR -     amLODipine (NORVASC) 5 MG tablet; TAKE ONE TABLET ONCE DAILY EVERY EVENING -     losartan (COZAAR) 100 MG tablet; TAKE ONE TABLET ONCE DAILY EVERY MORNING  Vitamin D deficiency -     VITAMIN D 25 Hydroxy (Vit-D Deficiency, Fractures)  Mixed hyperlipidemia -     Cancel: Lipid panel  Iron deficiency anemia, unspecified  iron deficiency anemia type -     Cancel: Iron, TIBC and Ferritin Panel  Well adult exam -     Cancel: CBC with Differential/Platelet -     Cancel: CMP14+EGFR -     Cancel: Lipid panel -     Cancel: Iron, TIBC and Ferritin Panel -     Urinalysis  Gastroesophageal reflux disease with esophagitis without hemorrhage -     esomeprazole (NEXIUM) 40 MG capsule; TAKE ONE CAPSULE ONCE DAILY  Blood iron lower than prior measurement -     Discontinue: ferrous sulfate (FE TABS) 325 (65 FE) MG EC tablet; Take 1 tablet (325 mg total) by mouth daily with breakfast.  Benign prostatic hyperplasia with urinary hesitancy -     PSA, total and free  Schizoaffective disorder, unspecified type (HCC)  Other orders -     famotidine (PEPCID) 20 MG tablet; Take  1 tablet (20 mg total) by mouth at bedtime.       I have discontinued Jerrett C. Hitsman's ferrous sulfate and ferrous sulfate. I have also changed his famotidine. Additionally, I am having him maintain his diclofenac Sodium, fluticasone, clobetasol, carbamazepine, Vitamin D (Ergocalciferol), doxycycline, LORazepam, chlorproMAZINE, trihexyphenidyl, amLODipine, esomeprazole, and losartan. We will continue to administer cyanocobalamin.  Allergies as of 10/25/2022       Reactions   Acetaminophen Nausea And Vomiting   Amoxicillin    REACTION: unspecified/unknown   Cephalexin Swelling   In hands   Erythromycin    REACTION:Increases tegretol level   Nsaids Other (See Comments)   REACTION: Cannot take due to kidney health   Penicillins    REACTION: Unknown        Medication List        Accurate as of October 25, 2022 11:57 AM. If you have any questions, ask your nurse or doctor.          STOP taking these medications    ferrous sulfate 325 (65 FE) MG EC tablet Commonly known as: Fe Tabs Stopped by: Kamali Nephew       TAKE these medications    amLODipine 5 MG tablet Commonly known as: NORVASC TAKE ONE TABLET ONCE DAILY EVERY  EVENING   carbamazepine 200 MG tablet Commonly known as: TEGretol TAKE (1/2) TABLET FOUR TIMES DAILY.   chlorproMAZINE 100 MG tablet Commonly known as: THORAZINE TAKE ONE TABLET FOUR TIMES DAILY   clobetasol 0.05 % external solution Commonly known as: TEMOVATE APPLY TOPICALLY 2 TIMES A DAY   diclofenac Sodium 1 % Gel Commonly known as: Voltaren Apply 2 g topically 4 (four) times daily. What changed:  when to take this reasons to take this   doxycycline 100 MG tablet Commonly known as: VIBRA-TABS Take 100 mg by mouth 2 (two) times daily.   esomeprazole 40 MG capsule Commonly known as: NEXIUM TAKE ONE CAPSULE ONCE DAILY What changed:  how much to take how to take this when to take this additional instructions Changed by: Cabria Micalizzi   famotidine 20 MG tablet Commonly known as: PEPCID Take 1 tablet (20 mg total) by mouth at bedtime.   fluticasone 50 MCG/ACT nasal spray Commonly known as: FLONASE 2 SPRAYS IN EACH NOSTRIL ONCE A DAY AS NEEDED   LORazepam 1 MG tablet Commonly known as: ATIVAN TAKE ONE TABLET FOUR TIMES DAILY (MAY TAKE 1 EXTRA DAILY IF NEEDED FOR PANIC)   losartan 100 MG tablet Commonly known as: COZAAR TAKE ONE TABLET ONCE DAILY EVERY MORNING   trihexyphenidyl 2 MG tablet Commonly known as: ARTANE TAKE ONE TABLET TWICE DAILY WITH MEAL(S)   Vitamin D (Ergocalciferol) 1.25 MG (50000 UNIT) Caps capsule Commonly known as: DRISDOL Take 1 capsule (50,000 Units total) by mouth every 7 (seven) days.         Follow-up: Return in about 6 months (around 04/27/2023).  Mechele Claude, M.D.

## 2022-10-26 NOTE — Progress Notes (Signed)
Hello Richard,  Your lab result is normal and/or stable.Some minor variations that are not significant are commonly marked abnormal, but do not represent any medical problem for you.  Best regards, Warren Stacks, M.D.

## 2022-10-27 ENCOUNTER — Telehealth: Payer: Self-pay | Admitting: Family Medicine

## 2022-10-27 NOTE — Telephone Encounter (Signed)
Patient aware and verbalizes understanding. 

## 2022-10-31 ENCOUNTER — Ambulatory Visit (INDEPENDENT_AMBULATORY_CARE_PROVIDER_SITE_OTHER): Payer: Medicare Other | Admitting: Psychiatry

## 2022-10-31 ENCOUNTER — Encounter: Payer: Self-pay | Admitting: Psychiatry

## 2022-10-31 DIAGNOSIS — G3184 Mild cognitive impairment, so stated: Secondary | ICD-10-CM

## 2022-10-31 DIAGNOSIS — F4001 Agoraphobia with panic disorder: Secondary | ICD-10-CM

## 2022-10-31 DIAGNOSIS — G2119 Other drug induced secondary parkinsonism: Secondary | ICD-10-CM

## 2022-10-31 DIAGNOSIS — F259 Schizoaffective disorder, unspecified: Secondary | ICD-10-CM

## 2022-10-31 DIAGNOSIS — F411 Generalized anxiety disorder: Secondary | ICD-10-CM

## 2022-10-31 DIAGNOSIS — Z87820 Personal history of traumatic brain injury: Secondary | ICD-10-CM
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
## 2022-11-17 ENCOUNTER — Ambulatory Visit: Payer: Medicare Other

## 2022-11-18 ENCOUNTER — Ambulatory Visit: Payer: Medicare Other

## 2022-11-18 DIAGNOSIS — E538 Deficiency of other specified B group vitamins: Secondary | ICD-10-CM

## 2022-11-18 NOTE — Progress Notes (Signed)
B12 injection given to patient and tolerated well.  

## 2022-12-20 ENCOUNTER — Ambulatory Visit (INDEPENDENT_AMBULATORY_CARE_PROVIDER_SITE_OTHER): Payer: Medicare Other | Admitting: *Deleted

## 2022-12-20 DIAGNOSIS — E538 Deficiency of other specified B group vitamins: Secondary | ICD-10-CM

## 2022-12-20 NOTE — Progress Notes (Signed)
Patient in today for his monthly B 12 injection. Patient tolerated well. Follow up appointment scheduled.

## 2022-12-27 ENCOUNTER — Ambulatory Visit (INDEPENDENT_AMBULATORY_CARE_PROVIDER_SITE_OTHER): Payer: Medicare Other | Admitting: Nurse Practitioner

## 2022-12-27 ENCOUNTER — Encounter: Payer: Self-pay | Admitting: Nurse Practitioner

## 2022-12-27 VITALS — BP 136/65 | HR 61 | Temp 99.4°F | Resp 20 | Ht 76.0 in | Wt 168.5 lb

## 2022-12-27 DIAGNOSIS — R0981 Nasal congestion: Secondary | ICD-10-CM | POA: Diagnosis not present

## 2022-12-27 DIAGNOSIS — U071 COVID-19: Secondary | ICD-10-CM | POA: Insufficient documentation

## 2022-12-27 DIAGNOSIS — R509 Fever, unspecified: Secondary | ICD-10-CM | POA: Diagnosis not present

## 2022-12-27 DIAGNOSIS — J3489 Other specified disorders of nose and nasal sinuses: Secondary | ICD-10-CM | POA: Insufficient documentation

## 2022-12-27 LAB — VERITOR FLU A/B WAIVED
Influenza A: NEGATIVE
Influenza B: NEGATIVE

## 2022-12-27 NOTE — Progress Notes (Signed)
Acute Office Visit  Subjective:     Patient ID: Matthew [name], male    DOB: 09-Jul-1950, 72 y.o.   MRN: [id number]  Chief Complaint  Patient presents with   URI    positive covid swab at home last night.cough, runny nose, fever.  cannot afford paxlovid, $200 with his insurance    URI  Associated symptoms include coughing. Pertinent negatives include no chest pain, ear pain, headaches, nausea, rash, sore throat or wheezing.   Upper Respiratory Infection: Patient complains of symptoms of a URI, tested COVID + last night from home test. Symptoms include congestion and cough. Onset of symptoms was 2 days ago, unchanged since that time. He also c/o low grade fever for the past 2 days .  He is drinking plenty of fluids. Evaluation to date: none. Treatment to date:  tylenol . He reports that his pharmacist told him that  he will have a $200 copay for Paxlovid.   Active Ambulatory Problems    Diagnosis Date Noted   Anxiety state 10/17/2008   Essential hypertension 10/17/2008   ESOPHAGEAL STRICTURE 10/17/2008   GERD 10/17/2008   BARRETTS ESOPHAGUS 10/17/2008   Constipation 10/17/2008   Chronic cough 05/01/2013   Pulmonary nodules 05/08/2013   GAD (generalized anxiety disorder) 01/13/2018   Schizoaffective disorder (HCC) 01/13/2018   Benzodiazepine dependence (HCC) 05/03/2019   Syncope and collapse 12/02/2019   Back pain without sciatica 05/19/2020   Ear pain, right 04/23/2021   Vitamin D deficiency 04/25/2022   Positive self-administered antigen test for COVID-19 12/27/2022   Fever 12/27/2022   Nasal congestion with rhinorrhea 12/27/2022   Resolved Ambulatory Problems    Diagnosis Date Noted   Educated about COVID-19 virus infection 12/02/2019   Palpitations 12/02/2019   Past Medical History:  Diagnosis Date   Agoraphobia    Anemia    Anxiety    Arthritis    Asthma    Barrett esophagus    Blood transfusion without reported diagnosis    Bronchitis    Chronic kidney  disease    Complication of anesthesia    Elevated serum creatinine    Esophageal stricture    GERD (gastroesophageal reflux disease)    Hypertension    Mood disorder (HCC)    Neck pain, chronic    Neuromuscular disorder (HCC)    Rosacea    Vitamin B12 deficiency     Review of Systems  Constitutional:  Negative for chills and fever.  HENT:  Negative for ear pain and sore throat.   Respiratory:  Positive for cough. Negative for wheezing.   Cardiovascular:  Negative for chest pain and leg swelling.  Gastrointestinal:  Negative for blood in stool, constipation and nausea.  Genitourinary:  Negative for frequency and urgency.  Musculoskeletal:  Negative for falls and myalgias.  Skin:  Negative for itching and rash.  Neurological:  Negative for dizziness and headaches.  Psychiatric/Behavioral:  The patient does not have insomnia.    Negative unless indicated in HPI    Objective:    BP 136/65   Pulse 61   Temp 99.4 F (37.4 C) (Oral)   Resp 20   Ht 6\' 4"  (1.93 m)   Wt 168 lb 8 oz (76.4 kg)   SpO2 95%   BMI 20.51 kg/m  BP Readings from Last 3 Encounters:  12/27/22 136/65  10/25/22 (!) 99/56  09/01/22 (!) 144/68   Wt Readings from Last 3 Encounters:  12/27/22 168 lb 8 oz (76.4 kg)  10/25/22 169  lb (76.7 kg)  09/01/22 167 lb 6.4 oz (75.9 kg)      Physical Exam Vitals and nursing note reviewed.  Constitutional:      General: He is not in acute distress.    Appearance: Normal appearance.  HENT:     Head: Normocephalic and atraumatic.     Nose: Congestion and rhinorrhea present.  Eyes:     General: No scleral icterus.    Extraocular Movements: Extraocular movements intact.     Conjunctiva/sclera: Conjunctivae normal.     Pupils: Pupils are equal, round, and reactive to light.  Cardiovascular:     Rate and Rhythm: Normal rate and regular rhythm.  Pulmonary:     Effort: Pulmonary effort is normal.     Breath sounds: Normal breath sounds.  Skin:    General: Skin is  warm and dry.     Coloration: Skin is not jaundiced.     Findings: No rash.  Neurological:     Mental Status: He is alert and oriented to person, place, and time. Mental status is at baseline.  Psychiatric:        Mood and Affect: Mood normal.        Behavior: Behavior normal.        Thought Content: Thought content normal.        Judgment: Judgment normal.    No results found for any visits on 12/27/22.      Assessment & Plan:  Fever, unspecified fever cause -     Veritor Flu A/B Waived -     Novel Coronavirus, NAA ([facility])  Positive self-administered antigen test for COVID-19  Nasal congestion with rhinorrhea  Matthew [name] is 72 yrs old Caucasian male no acute distress. COVID positive continue tylenol for fever Increase hydration and rest Congestion continue Flonase as already prescribed   The above assessment and management plan was discussed with the patient. The patient verbalized understanding of and has agreed to the management plan. Patient is aware to call the clinic if they develop any new symptoms or if symptoms persist or worsen. Patient is aware when to return to the clinic for a follow-up visit. Patient educated on when it is appropriate to go to the emergency department.      Person Under Monitoring Name: Matthew [name]  Location: [address] [place] [address]   Infection Prevention Recommendations for Individuals Confirmed to have, or Being Evaluated for, 2019 Novel Coronavirus (COVID-19) Infection Who Receive Care at Home  Individuals who are confirmed to have, or are being evaluated for, COVID-19 should follow the prevention steps below until a healthcare provider or local or state health department says they can return to normal activities.  Stay home except to get medical care You should restrict activities outside your home, except for getting medical care. Do not go to work, school, or public areas, and do not use public transportation or  taxis.  Call ahead before visiting your doctor Before your medical appointment, call the healthcare provider and tell them that you have, or are being evaluated for, COVID-19 infection. This will help the healthcare provider's office take steps to keep other people from getting infected. Ask your healthcare provider to call the local or state health department.  Monitor your symptoms Seek prompt medical attention if your illness is worsening (e.g., difficulty breathing). Before going to your medical appointment, call the healthcare provider and tell them that you have, or are being evaluated for, COVID-19 infection. Ask your healthcare provider  to call the local or state health department.  Wear a facemask You should wear a facemask that covers your nose and mouth when you are in the same room with other people and when you visit a healthcare provider. People who live with or visit you should also wear a facemask while they are in the same room with you.  Separate yourself from other people in your home As much as possible, you should stay in a different room from other people in your home. Also, you should use a separate bathroom, if available.  Avoid sharing household items You should not share dishes, drinking glasses, cups, eating utensils, towels, bedding, or other items with other people in your home. After using these items, you should wash them thoroughly with soap and water.  Cover your coughs and sneezes Cover your mouth and nose with a tissue when you cough or sneeze, or you can cough or sneeze into your sleeve. Throw used tissues in a lined trash can, and immediately wash your hands with soap and water for at least 20 seconds or use an alcohol-based hand rub.  Wash your [employer] your hands often and thoroughly with soap and water for at least 20 seconds. You can use an alcohol-based hand sanitizer if soap and water are not available and if your hands are not visibly  dirty. Avoid touching your eyes, nose, and mouth with unwashed hands.   Prevention Steps for Caregivers and Household Members of Individuals Confirmed to have, or Being Evaluated for, COVID-19 Infection Being Cared for in the Home  If you live with, or provide care at home for, a person confirmed to have, or being evaluated for, COVID-19 infection please follow these guidelines to prevent infection:  Follow healthcare provider's instructions Make sure that you understand and can help the patient follow any healthcare provider instructions for all care.  Provide for the patient's basic needs You should help the patient with basic needs in the home and provide support for getting groceries, prescriptions, and other personal needs.  Monitor the patient's symptoms If they are getting sicker, call his or her medical provider and tell them that the patient has, or is being evaluated for, COVID-19 infection. This will help the healthcare provider's office take steps to keep other people from getting infected. Ask the healthcare provider to call the local or state health department.  Limit the number of people who have contact with the patient If possible, have only one caregiver for the patient. Other household members should stay in another home or place of residence. If this is not possible, they should stay in another room, or be separated from the patient as much as possible. Use a separate bathroom, if available. Restrict visitors who do not have an essential need to be in the home.  Keep older adults, very young children, and other sick people away from the patient Keep older adults, very young children, and those who have compromised immune systems or chronic health conditions away from the patient. This includes people with chronic heart, lung, or kidney conditions, diabetes, and cancer.  Ensure good ventilation Make sure that shared spaces in the home have good air flow, such as  from an air conditioner or an opened window, weather permitting.  Wash your hands often Wash your hands often and thoroughly with soap and water for at least 20 seconds. You can use an alcohol based hand sanitizer if soap and water are not available and if your hands are  not visibly dirty. Avoid touching your eyes, nose, and mouth with unwashed hands. Use disposable paper towels to dry your hands. If not available, use dedicated cloth towels and replace them when they become wet.  Wear a facemask and gloves Wear a disposable facemask at all times in the room and gloves when you touch or have contact with the patient's blood, body fluids, and/or secretions or excretions, such as sweat, saliva, sputum, nasal mucus, vomit, urine, or feces.  Ensure the mask fits over your nose and mouth tightly, and do not touch it during use. Throw out disposable facemasks and gloves after using them. Do not reuse. Wash your hands immediately after removing your facemask and gloves. If your personal clothing becomes contaminated, carefully remove clothing and launder. Wash your hands after handling contaminated clothing. Place all used disposable facemasks, gloves, and other waste in a lined container before disposing them with other household waste. Remove gloves and wash your hands immediately after handling these items.  Do not share dishes, glasses, or other household items with the patient Avoid sharing household items. You should not share dishes, drinking glasses, cups, eating utensils, towels, bedding, or other items with a patient who is confirmed to have, or being evaluated for, COVID-19 infection. After the person uses these items, you should wash them thoroughly with soap and water.  Wash laundry thoroughly Immediately remove and wash clothes or bedding that have blood, body fluids, and/or secretions or excretions, such as sweat, saliva, sputum, nasal mucus, vomit, urine, or feces, on them. Wear gloves  when handling laundry from the patient. Read and follow directions on labels of laundry or clothing items and detergent. In general, wash and dry with the warmest temperatures recommended on the label.  Clean all areas the individual has used often Clean all touchable surfaces, such as counters, tabletops, doorknobs, bathroom fixtures, toilets, phones, keyboards, tablets, and bedside tables, every day. Also, clean any surfaces that may have blood, body fluids, and/or secretions or excretions on them. Wear gloves when cleaning surfaces the patient has come in contact with. Use a diluted bleach solution (e.g., dilute bleach with 1 part bleach and 10 parts water) or a household disinfectant with a label that says EPA-registered for coronaviruses. To make a bleach solution at home, add 1 tablespoon of bleach to 1 quart (4 cups) of water. For a larger supply, add  cup of bleach to 1 gallon (16 cups) of water. Read labels of cleaning products and follow recommendations provided on product labels. Labels contain instructions for safe and effective use of the cleaning product including precautions you should take when applying the product, such as wearing gloves or eye protection and making sure you have good ventilation during use of the product. Remove gloves and wash hands immediately after cleaning.  Monitor yourself for signs and symptoms of illness Caregivers and household members are considered close contacts, should monitor their health, and will be asked to limit movement outside of the home to the extent possible. Follow the monitoring steps for close contacts listed on the symptom monitoring form.   ? If you have additional questions, contact your local health department or call the epidemiologist on call at [phone] (available 24/7). ? This guidance is subject to change. For the most up-to-date guidance from [facility], please refer to their  website: [employer]hu  Return if symptoms worsen or fail to improve.  Arrie Aran Santa Lighter, DNP [facility] [address] [place], [place] [address] [phone]

## 2022-12-28 LAB — NOVEL CORONAVIRUS, NAA: SARS-CoV-2, NAA: DETECTED — AB

## 2023-01-20 ENCOUNTER — Ambulatory Visit (INDEPENDENT_AMBULATORY_CARE_PROVIDER_SITE_OTHER): Payer: Medicare Other

## 2023-01-20 DIAGNOSIS — E538 Deficiency of other specified B group vitamins: Secondary | ICD-10-CM

## 2023-01-20 NOTE — Progress Notes (Signed)
Cyanocobalamin injection given to right deltoid.  Patient tolerated well. 

## 2023-02-03 ENCOUNTER — Other Ambulatory Visit: Payer: Self-pay | Admitting: Psychiatry

## 2023-02-03 DIAGNOSIS — F259 Schizoaffective disorder, unspecified: Secondary | ICD-10-CM

## 2023-02-03 NOTE — Telephone Encounter (Signed)
Lf 09/09; lv 08/5; nv 12/5

## 2023-02-04 ENCOUNTER — Other Ambulatory Visit: Payer: Self-pay | Admitting: Family Medicine

## 2023-02-04 DIAGNOSIS — J40 Bronchitis, not specified as acute or chronic: Secondary | ICD-10-CM

## 2023-02-09 ENCOUNTER — Telehealth: Payer: Self-pay | Admitting: *Deleted

## 2023-02-09 NOTE — Telephone Encounter (Signed)
SPOKE WITH PATIENT AND HE REPORTS IT WAS LABS FROM [facility] AND HE ALREADY HAS GOTTEN MEDS

## 2023-02-15 ENCOUNTER — Telehealth: Payer: Self-pay | Admitting: *Deleted

## 2023-02-15 NOTE — Telephone Encounter (Signed)
The only way to know for sure would be to check the blood level for Vitamin D

## 2023-02-15 NOTE — Telephone Encounter (Signed)
Patient reports he spoke with his kidney specialist told him he could stop taking the prescription vitamin d and take vitamin d 3 1000 daily.

## 2023-02-15 NOTE — Telephone Encounter (Signed)
Copied from CRM [id number]. Topic: Clinical - Medical Advice >> Feb 15, 2023  1:32 PM Herbert Seta B wrote: Reason for CRM: Patient called because was given a sheet of paper with his medications and instructions after seeing kidney specialist(nurse-Elaina). Needs advised on medication etc. Under the impression that he does not need Vitamin D any longer.

## 2023-02-20 NOTE — Telephone Encounter (Signed)
Copied from CRM [id number]. Topic: Clinical - Medication Question >> Feb 20, 2023  4:32 PM Tiffany H wrote: Reason for CRM: Patient called to advise that he was recently told to stop taking prescribed Vitamin and replace it with regular OTC vitamins. Patient advised that he picked up Vitamin [id number] from the [facility] today. He would like to know if the new vitamins are safe to take. Please advise.

## 2023-02-20 NOTE — Telephone Encounter (Signed)
PATIENT AWARE, SAFE TO TAKE AS THIS WAS WHAT WAS ADVISED TO DO

## 2023-02-21 ENCOUNTER — Ambulatory Visit (INDEPENDENT_AMBULATORY_CARE_PROVIDER_SITE_OTHER): Payer: Medicare Other

## 2023-02-21 DIAGNOSIS — E538 Deficiency of other specified B group vitamins: Secondary | ICD-10-CM | POA: Diagnosis not present

## 2023-02-21 NOTE — Progress Notes (Signed)
Cyanocobalamin injection given to left deltoid.  Patient tolerated well. 

## 2023-03-02 ENCOUNTER — Ambulatory Visit: Payer: Medicare Other | Admitting: Psychiatry

## 2023-03-02 ENCOUNTER — Encounter: Payer: Self-pay | Admitting: Psychiatry

## 2023-03-02 DIAGNOSIS — G3184 Mild cognitive impairment, so stated: Secondary | ICD-10-CM

## 2023-03-02 DIAGNOSIS — F411 Generalized anxiety disorder: Secondary | ICD-10-CM | POA: Diagnosis not present

## 2023-03-02 DIAGNOSIS — F4001 Agoraphobia with panic disorder: Secondary | ICD-10-CM | POA: Diagnosis not present

## 2023-03-02 DIAGNOSIS — F259 Schizoaffective disorder, unspecified: Secondary | ICD-10-CM

## 2023-03-02 DIAGNOSIS — G2119 Other drug induced secondary parkinsonism: Secondary | ICD-10-CM

## 2023-03-02 DIAGNOSIS — Z87820 Personal history of traumatic brain injury: Secondary | ICD-10-CM

## 2023-03-02 MED ORDER — CARBAMAZEPINE 200 MG PO TABS: ORAL_TABLET | ORAL | 1 refills | Status: DC

## 2023-03-02 MED ORDER — CHLORPROMAZINE HCL 100 MG PO TABS: ORAL_TABLET | ORAL | 1 refills | Status: DC

## 2023-03-02 MED ORDER — LORAZEPAM 1 MG PO TABS: ORAL_TABLET | ORAL | 3 refills | Status: DC

## 2023-03-02 MED ORDER — TRIHEXYPHENIDYL HCL 2 MG PO TABS: ORAL_TABLET | ORAL | 1 refills | Status: DC
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
## 2023-03-24 ENCOUNTER — Ambulatory Visit (INDEPENDENT_AMBULATORY_CARE_PROVIDER_SITE_OTHER): Payer: Medicare Other | Admitting: *Deleted

## 2023-03-24 DIAGNOSIS — E538 Deficiency of other specified B group vitamins: Secondary | ICD-10-CM

## 2023-03-27 ENCOUNTER — Ambulatory Visit: Payer: Self-pay | Admitting: Family Medicine

## 2023-03-27 NOTE — Telephone Encounter (Signed)
Copied from CRM [id number]. Topic: Clinical - Red Word Triage >> Mar 27, 2023  8:40 AM Ivette P wrote: Red Word that prompted transfer to Nurse Triage: swelling, pt has right eye with redness and pinkage. With drainage on the eye.  Chief Complaint: right eye problem Symptoms: eye lid red, drainage Frequency: constant Pertinent Negatives: Patient denies blurry vision. Disposition: [] ED /[x] Urgent Care (no appt availability in office) / [] Appointment(In office/virtual)/ []  [facility]/ [] Home Care/ [] Refused Recommended Disposition /[] [facility]/ []  Follow-up with PCP Additional Notes: states eye has been bothering him for some time.  No apt. Available, instructed to go to UC.  States doesn't want to go there.  Instructed to go to er if becomes worse.   Reason for Disposition  Eyelid is very swollen (shut or almost)  Answer Assessment - Initial Assessment Questions 1. LOCATION: Location: "What's red, the eyeball or the outer eyelids?" (Note: when callers say the eye is red, they usually mean the sclera is red)       Right eye 2. REDNESS OF SCLERA: "Is the redness in one or both eyes?" "When did the redness start?"      Eye lid is red and draining  3. ONSET: "When did the eye become red?" (e.g., hours, days)      After christmas 4. EYELIDS: "Are the eyelids red or swollen?" If Yes, ask: "How much?"      Right eye swollen 5. VISION: "Is there any difficulty seeing clearly?"      yes 6. ITCHING: "Does it feel itchy?" If so ask: "How bad is it" (e.g., Scale 1-10; or mild, moderate, severe)     It did yesterday 7. PAIN: "Is there any pain? If Yes, ask: "How bad is it?" (e.g., Scale 1-10; or mild, moderate, severe)     deines 9. CAUSE: "What do you think is causing the redness?"     Pink eye  Protocols used: Eye - Red Without Pus-A-AH

## 2023-04-05 ENCOUNTER — Ambulatory Visit: Payer: Medicare Other | Admitting: Family Medicine

## 2023-04-05 ENCOUNTER — Encounter: Payer: Self-pay | Admitting: Family Medicine

## 2023-04-05 VITALS — BP 109/61 | HR 90 | Temp 96.8°F | Ht 76.0 in | Wt 166.4 lb

## 2023-04-05 DIAGNOSIS — H1013 Acute atopic conjunctivitis, bilateral: Secondary | ICD-10-CM

## 2023-04-05 MED ORDER — OLOPATADINE HCL 0.2 % OP SOLN: 1.0000 [drp] | Freq: Every day | OPHTHALMIC | 0 refills | Status: AC
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
## 2023-04-19 ENCOUNTER — Ambulatory Visit: Payer: Self-pay | Admitting: Family Medicine

## 2023-04-19 ENCOUNTER — Telehealth: Payer: Self-pay

## 2023-04-19 NOTE — Telephone Encounter (Signed)
Copied from CRM [id number]. Topic: Clinical - Medical Advice >> Apr 19, 2023  9:02 AM Carlatta H wrote: Reason for CRM: Patient would like a call back from the nurse regarding possible food poisoning//

## 2023-04-19 NOTE — Telephone Encounter (Signed)
Patient states he has been vomiting but that he has not since he ate some bread.  Patient aware to contact us if he is not getting any better.  Verbalizes understanding.

## 2023-04-19 NOTE — Telephone Encounter (Signed)
Copied from CRM [id number]. Topic: Clinical - Red Word Triage >> Apr 19, 2023  3:53 PM Matthew [name] wrote: Red Word that prompted transfer to Nurse Triage: body aches along with vomiting, thinks he might have food poisoning.  Patient called back to inform us he believes his symptoms are being caused by a hiatal hernia, stating that he has had similar symptoms in the past. Patient has no new complaints since his call earlier today. Patient triaged previously and has an appointment for tomorrow. I advised the patient to mention this information at his appointment tomorrow, which he said that he would.    Reason for Disposition  [1] Follow-up call to recent contact AND [2] information only call, no triage required  Answer Assessment - Initial Assessment Questions 1. REASON FOR CALL or QUESTION: "What is your reason for calling today?" or "How can I best help you?" or "What question do you have that I can help answer?"     Patient calling to report he believes his symptoms are due to a hiatal hernia which has caused similar symptoms in the past.  Protocols used: Information Only Call - No Triage-A-AH

## 2023-04-19 NOTE — Telephone Encounter (Signed)
Chief Complaint: Achy body Symptoms: achy body, vomiting, runny nose Frequency: this morning and resolving Pertinent Negatives: Patient denies fever Disposition: [] ED /[] Urgent Care (no appt availability in office) / [] Appointment(In office/virtual)/ []  [place] [facility]/ [x] Home Care/ [] Refused Recommended Disposition /[] [place] [facility]/ []  Follow-up with PCP Additional Notes: Patient returning call from RN to report symptoms of achy body and vomiting a few times this morning. Patient reported there has been zero further vomiting episodes since 0530 this morning and that his body aches are resolving now that he is up and moving around. Patient states he is able to eat and drink as normal. Advised patient on home care advice for body aches and hydration status and to call office back if symptoms worsen.    Copied from CRM [id number]. Topic: Clinical - Red Word Triage >> Apr 19, 2023  3:53 PM Geroge Baseman wrote: Red Word that prompted transfer to Nurse Triage: body aches along with vomiting, thinks he might have food poisoning. Reason for Disposition  [1] MILD pain (e.g., does not interfere with normal activities) AND [2] present < 7 days  Answer Assessment - Initial Assessment Questions 1. ONSET: "When did the muscle aches or body pains start?"      When I woke up today  2. LOCATION: "What part of your body is hurting?" (e.g., entire body, arms, legs)      Back, I need an operation done at some point 3. SEVERITY: "How bad is the pain?" (Scale 1-10; or mild, moderate, severe)   - MILD (1-3): doesn't interfere with normal activities    - MODERATE (4-7): interferes with normal activities or awakens from sleep    - SEVERE (8-10):  excruciating pain, unable to do any normal activities      Moderate  4. CAUSE: "What do you think is causing the pains?"     Feeling weak  5. FEVER: "Have you been having fever?"     I don't feel like it 6. OTHER SYMPTOMS: "Do you have any other symptoms?"  (e.g., chest pain, weakness, rash, cold or flu symptoms, weight loss)     Vomiting, runny nose,  Protocols used: Muscle Aches and Body Pain-A-AH

## 2023-04-20 ENCOUNTER — Other Ambulatory Visit: Payer: Medicare Other

## 2023-04-20 DIAGNOSIS — N1832 Chronic kidney disease, stage 3b: Secondary | ICD-10-CM

## 2023-04-20 DIAGNOSIS — I10 Essential (primary) hypertension: Secondary | ICD-10-CM

## 2023-04-20 DIAGNOSIS — E782 Mixed hyperlipidemia: Secondary | ICD-10-CM

## 2023-04-20 DIAGNOSIS — D509 Iron deficiency anemia, unspecified: Secondary | ICD-10-CM

## 2023-04-20 DIAGNOSIS — E559 Vitamin D deficiency, unspecified: Secondary | ICD-10-CM

## 2023-04-21 ENCOUNTER — Ambulatory Visit (INDEPENDENT_AMBULATORY_CARE_PROVIDER_SITE_OTHER): Payer: Medicare Other | Admitting: *Deleted

## 2023-04-21 DIAGNOSIS — E538 Deficiency of other specified B group vitamins: Secondary | ICD-10-CM

## 2023-04-21 LAB — CMP14+EGFR
ALT: 21 [IU]/L (ref 0–44)
AST: 18 [IU]/L (ref 0–40)
Albumin: 3.9 g/dL (ref 3.9–4.9)
Alkaline Phosphatase: 110 [IU]/L (ref 44–121)
BUN/Creatinine Ratio: 16 (ref 10–24)
BUN: 32 mg/dL — ABNORMAL HIGH (ref 8–27)
Bilirubin Total: 0.4 mg/dL (ref 0.0–1.2)
CO2: 21 mmol/L (ref 20–29)
Calcium: 8.8 mg/dL (ref 8.6–10.2)
Chloride: 103 mmol/L (ref 96–106)
Creatinine, Ser: 2.03 mg/dL — ABNORMAL HIGH (ref 0.76–1.27)
Globulin, Total: 2.4 g/dL (ref 1.5–4.5)
Glucose: 112 mg/dL — ABNORMAL HIGH (ref 70–99)
Potassium: 4.1 mmol/L (ref 3.5–5.2)
Sodium: 137 mmol/L (ref 134–144)
Total Protein: 6.3 g/dL (ref 6.0–8.5)
eGFR: 35 mL/min/{1.73_m2} — ABNORMAL LOW (ref >59)

## 2023-04-21 LAB — CBC WITH DIFFERENTIAL/PLATELET
Basophils Absolute: 0 10*3/uL (ref 0.0–0.2)
Basos: 1 %
EOS (ABSOLUTE): 0.1 10*3/uL (ref 0.0–0.4)
Eos: 3 %
Hematocrit: 34.7 % — ABNORMAL LOW (ref 37.5–51.0)
Hemoglobin: 11.4 g/dL — ABNORMAL LOW (ref 13.0–17.7)
Immature Grans (Abs): 0 10*3/uL (ref 0.0–0.1)
Immature Granulocytes: 1 %
Lymphocytes Absolute: 0.7 10*3/uL (ref 0.7–3.1)
Lymphs: 15 %
MCH: 29.5 pg (ref 26.6–33.0)
MCHC: 32.9 g/dL (ref 31.5–35.7)
MCV: 90 fL (ref 79–97)
Monocytes Absolute: 0.7 10*3/uL (ref 0.1–0.9)
Monocytes: 17 %
Neutrophils Absolute: 2.8 10*3/uL (ref 1.4–7.0)
Neutrophils: 63 %
Platelets: 169 10*3/uL (ref 150–450)
RBC: 3.87 x10E6/uL — ABNORMAL LOW (ref 4.14–5.80)
RDW: 12.5 % (ref 11.6–15.4)
WBC: 4.3 10*3/uL (ref 3.4–10.8)

## 2023-04-21 LAB — LIPID PANEL
Chol/HDL Ratio: 2.2 {ratio} (ref 0.0–5.0)
Cholesterol, Total: 136 mg/dL (ref 100–199)
HDL: 61 mg/dL (ref >39)
LDL Chol Calc (NIH): 62 mg/dL (ref 0–99)
Triglycerides: 63 mg/dL (ref 0–149)
VLDL Cholesterol Cal: 13 mg/dL (ref 5–40)

## 2023-04-21 LAB — VITAMIN D 25 HYDROXY (VIT D DEFICIENCY, FRACTURES): Vit D, 25-Hydroxy: 33.6 ng/mL (ref 30.0–100.0)

## 2023-04-21 LAB — IRON,TIBC AND FERRITIN PANEL
Ferritin: 164 ng/mL (ref 30–400)
Iron Saturation: 25 % (ref 15–55)
Iron: 51 ug/dL (ref 38–169)
Total Iron Binding Capacity: 202 ug/dL — ABNORMAL LOW (ref 250–450)
UIBC: 151 ug/dL (ref 111–343)

## 2023-04-21 NOTE — Progress Notes (Signed)
Patient in today for monthly B 12 injection. Tolerated well.

## 2023-04-27 ENCOUNTER — Ambulatory Visit: Payer: Medicare Other | Admitting: Family Medicine

## 2023-05-01 ENCOUNTER — Other Ambulatory Visit: Payer: Medicare Other

## 2023-05-01 NOTE — Telephone Encounter (Unsigned)
Copied from CRM [id number]. Topic: General - Billing Inquiry >> May 01, 2023  5:06 PM Eunice Blase wrote: Reason for CRM: Pt called regarding bill for $101.36 04/25/2023. Pt has medicaid and medicare. He would like a call back to discuss bill. Call [phone].

## 2023-05-09 ENCOUNTER — Ambulatory Visit: Payer: Medicare Other | Admitting: Family Medicine

## 2023-05-09 VITALS — BP 132/71 | HR 62 | Temp 97.4°F | Ht 76.0 in | Wt 171.0 lb

## 2023-05-09 DIAGNOSIS — E559 Vitamin D deficiency, unspecified: Secondary | ICD-10-CM | POA: Diagnosis not present

## 2023-05-09 DIAGNOSIS — Z23 Encounter for immunization: Secondary | ICD-10-CM | POA: Diagnosis not present

## 2023-05-09 DIAGNOSIS — I10 Essential (primary) hypertension: Secondary | ICD-10-CM | POA: Diagnosis not present

## 2023-05-09 DIAGNOSIS — K2100 Gastro-esophageal reflux disease with esophagitis, without bleeding: Secondary | ICD-10-CM

## 2023-05-09 DIAGNOSIS — F259 Schizoaffective disorder, unspecified: Secondary | ICD-10-CM | POA: Diagnosis not present

## 2023-05-09 DIAGNOSIS — E782 Mixed hyperlipidemia: Secondary | ICD-10-CM

## 2023-05-09 NOTE — Progress Notes (Unsigned)
Subjective:  Patient ID: Matthew [name], male    DOB: 06/14/1950  Age: 73 y.o. MRN: [id number]  CC: Medical Management of Chronic Issues and pinched nerve (Dr wants to operate on pinched nerve. Concerned about kidneys because last time creatinine went up with surgery. )   HPI Matthew Bien Nilsen presents for pinched nerve. Considering surgery.  Dr. [name] it's a bad nerve. Pain is 2-3/10. Right lower lumbar with radiation down lateral thigh. Denies weakness, numbness. Doesn't affect activities other than the mild pain.   Patient in for follow-up of GERD. Currently asymptomatic taking  PPI daily. There is no chest pain or heartburn. No hematemesis and no melena. No dysphagia or choking. Onset is remote. Progression is stable. Complicating factors, none.   presents for  follow-up of hypertension. Patient has no history of headache chest pain or shortness of breath or recent cough. Patient also denies symptoms of TIA such as focal numbness or weakness. Patient denies side effects from medication. States taking it regularly.      05/09/2023   12:03 PM 12/27/2022   12:51 PM 10/25/2022   11:01 AM  Depression screen PHQ 2/9  Decreased Interest 0 0 0  Down, Depressed, Hopeless 0 0 0  PHQ - 2 Score 0 0 0  Altered sleeping 0 0   Tired, decreased energy 0 0   Change in appetite 0 0   Feeling bad or failure about yourself  0 0   Trouble concentrating 0 0   Moving slowly or fidgety/restless 0 0   Suicidal thoughts 0 0   PHQ-9 Score 0 0   Difficult doing work/chores Not difficult at all      History Jacquees has a past medical history of Agoraphobia, Anemia, Anxiety, Arthritis, Asthma, Barrett esophagus, Blood transfusion without reported diagnosis, Bronchitis, Chronic kidney disease, Complication of anesthesia, Educated about COVID-19 virus infection (12/02/2019), Elevated serum creatinine, Esophageal stricture, GERD (gastroesophageal reflux disease), Hypertension, Mood disorder (HCC), Neck pain, chronic,  Neuromuscular disorder (HCC), Palpitations (12/02/2019), Rosacea, and Vitamin B12 deficiency.   He has a past surgical history that includes Chest tube insertion; Fracture surgery; Wisdom tooth extraction; Lumbar laminectomy/decompression microdiscectomy (12/06/2011); Colonoscopy (08/20/2003); Upper gastrointestinal endoscopy (03/15/2013); and skin cancer removal from forehead.   His family history includes Diabetes in his mother; Heart disease in his mother; Stroke in his mother.He reports that he quit smoking about 38 years ago. His smoking use included cigarettes. He started smoking about 41 years ago. He has a 0.9 pack-year smoking history. He has never used smokeless tobacco. He reports that he does not drink alcohol and does not use drugs.    ROS Review of Systems  Objective:  BP 132/71   Pulse 62   Temp (!) 97.4 F (36.3 C)   Ht 6\' 4"  (1.93 m)   Wt 171 lb (77.6 kg)   SpO2 100%   BMI 20.81 kg/m   BP Readings from Last 3 Encounters:  05/09/23 132/71  04/05/23 109/61  12/27/22 136/65    Wt Readings from Last 3 Encounters:  05/09/23 171 lb (77.6 kg)  04/05/23 166 lb 6.4 oz (75.5 kg)  12/27/22 168 lb 8 oz (76.4 kg)     Physical Exam    Assessment & Plan:   There are no diagnoses linked to this encounter.     I am having Jerret C. Vowell maintain his diclofenac Sodium, clobetasol, Vitamin D (Ergocalciferol), amLODipine, esomeprazole, famotidine, losartan, fluticasone, carbamazepine, chlorproMAZINE, LORazepam, trihexyphenidyl, and Olopatadine HCl.  Allergies as  of 05/09/2023       Reactions   Acetaminophen Nausea And Vomiting   Amoxicillin    REACTION: unspecified/unknown   Cephalexin Swelling   In hands   Erythromycin    REACTION:Increases tegretol level   Nsaids Other (See Comments)   REACTION: Cannot take due to kidney health   Penicillins    REACTION: Unknown        Medication List        Accurate as of May 09, 2023 12:14 PM. If you have any  questions, ask your nurse or doctor.          amLODipine 5 MG tablet Commonly known as: NORVASC TAKE ONE TABLET ONCE DAILY EVERY EVENING   carbamazepine 200 MG tablet Commonly known as: TEGretol TAKE 1/2 TABLET FOUR TIMES DAILY   chlorproMAZINE 100 MG tablet Commonly known as: THORAZINE TAKE ONE TABLET FOUR TIMES DAILY   clobetasol 0.05 % external solution Commonly known as: TEMOVATE APPLY TOPICALLY 2 TIMES A DAY   diclofenac Sodium 1 % Gel Commonly known as: Voltaren Apply 2 g topically 4 (four) times daily. What changed:  when to take this reasons to take this   esomeprazole 40 MG capsule Commonly known as: NEXIUM TAKE ONE CAPSULE ONCE DAILY   famotidine 20 MG tablet Commonly known as: PEPCID Take 1 tablet (20 mg total) by mouth at bedtime.   fluticasone 50 MCG/ACT nasal spray Commonly known as: FLONASE USE 2 SPRAYS IN EACH NOSTRIL ONCE DAILY   LORazepam 1 MG tablet Commonly known as: ATIVAN TAKE ONE TABLET FOUR TIMES DAILY (MAY TAKE 1 EXTRA DAILY IF NEEDED FOR PANIC)   losartan 100 MG tablet Commonly known as: COZAAR TAKE ONE TABLET ONCE DAILY EVERY MORNING   Olopatadine HCl 0.2 % Soln Apply 1 drop to eye daily.   trihexyphenidyl 2 MG tablet Commonly known as: ARTANE TAKE ONE TABLET TWICE DAILY WITH MEAL(S)   Vitamin D (Ergocalciferol) 1.25 MG (50000 UNIT) Caps capsule Commonly known as: DRISDOL Take 1 capsule (50,000 Units total) by mouth every 7 (seven) days.         Follow-up: No follow-ups on file.  Mechele Claude, M.D.

## 2023-05-12 ENCOUNTER — Encounter: Payer: Self-pay | Admitting: Family Medicine

## 2023-05-19 ENCOUNTER — Ambulatory Visit: Payer: Medicare Other | Admitting: *Deleted

## 2023-05-19 DIAGNOSIS — E538 Deficiency of other specified B group vitamins: Secondary | ICD-10-CM | POA: Diagnosis not present

## 2023-05-19 MED ORDER — CYANOCOBALAMIN 1000 MCG/ML IJ SOLN
1000.0000 ug | INTRAMUSCULAR | Status: AC
  Administered 2023-05-19 – 2024-03-29 (×10): 1000 ug via INTRAMUSCULAR

## 2023-05-19 NOTE — Progress Notes (Signed)
 Patient is in office today for a nurse visit for B12 Injection. Patient Injection was given in the  Right deltoid. Patient tolerated injection well.

## 2023-06-07 ENCOUNTER — Ambulatory Visit: Payer: Self-pay | Admitting: Family Medicine

## 2023-06-07 NOTE — Telephone Encounter (Signed)
 essage from [facility] sent at 06/07/2023  1:20 PM EDT  Copied From CRM #[id number]. Reason for Triage: PT has some medical questions and would like them answered, Pt would like to know if the sweetener splenda in sweet tea causes cancer and if he should stop drinking it?  Pt stated he will be home around 4-4:30 pm

## 2023-06-07 NOTE — Telephone Encounter (Signed)
 It is not known to cause canccer. Equal and sweet and low can

## 2023-06-07 NOTE — Telephone Encounter (Signed)
 Pt informed    LS

## 2023-06-13 ENCOUNTER — Telehealth: Payer: Self-pay | Admitting: Psychiatry

## 2023-06-13 NOTE — Telephone Encounter (Signed)
 Matthew [name] called today at 4:20 to report that he is sleeping all the time.  I.e., he went to bed last night at 11:30pm, woke up around 1:30 bur went back to bed and slept until noon.  He was woken up but then went back to sleep and woke up at 4pm.  [name] this has been going on for months.  I asked why he hadn't mentioned this before when he called and he said he didn't know why.He said he is just sleeping too much.  Please call to discuss.  Appt 4./9

## 2023-06-14 NOTE — Telephone Encounter (Signed)
 Called patient and he said he talked to Maralyn Sago this morning and she got him straightened out.  A woman he grew up with died in her sleep and he said he was afraid to go to sleep.  He does c/o sleeping too much, said it is the medication, but he doesn't want to change anything.

## 2023-06-19 ENCOUNTER — Ambulatory Visit (INDEPENDENT_AMBULATORY_CARE_PROVIDER_SITE_OTHER): Payer: Medicare Other | Admitting: *Deleted

## 2023-06-19 DIAGNOSIS — E538 Deficiency of other specified B group vitamins: Secondary | ICD-10-CM

## 2023-06-19 NOTE — Progress Notes (Cosign Needed Addendum)
 Patient is in office today for a nurse visit for B12 Injection. Patient Injection was given in the  Right deltoid. Patient tolerated injection well.

## 2023-07-05 ENCOUNTER — Encounter: Payer: Self-pay | Admitting: Psychiatry

## 2023-07-05 ENCOUNTER — Ambulatory Visit: Payer: Medicare Other | Admitting: Psychiatry

## 2023-07-05 DIAGNOSIS — F259 Schizoaffective disorder, unspecified: Secondary | ICD-10-CM

## 2023-07-05 DIAGNOSIS — F411 Generalized anxiety disorder: Secondary | ICD-10-CM | POA: Diagnosis not present

## 2023-07-05 DIAGNOSIS — G3184 Mild cognitive impairment, so stated: Secondary | ICD-10-CM

## 2023-07-05 DIAGNOSIS — F4001 Agoraphobia with panic disorder: Secondary | ICD-10-CM

## 2023-07-05 DIAGNOSIS — Z87820 Personal history of traumatic brain injury: Secondary | ICD-10-CM
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
## 2023-07-09 ENCOUNTER — Encounter (HOSPITAL_COMMUNITY): Payer: Self-pay | Admitting: *Deleted

## 2023-07-09 ENCOUNTER — Emergency Department (HOSPITAL_COMMUNITY)
Admission: EM | Admit: 2023-07-09 | Discharge: 2023-07-09 | Disposition: A | Discharge status: Home / Self Care | Attending: Emergency Medicine | Admitting: Emergency Medicine

## 2023-07-09 ENCOUNTER — Other Ambulatory Visit: Payer: Self-pay

## 2023-07-09 ENCOUNTER — Emergency Department (HOSPITAL_COMMUNITY)

## 2023-07-09 DIAGNOSIS — I129 Hypertensive chronic kidney disease with stage 1 through stage 4 chronic kidney disease, or unspecified chronic kidney disease: Secondary | ICD-10-CM | POA: Diagnosis not present

## 2023-07-09 DIAGNOSIS — Z79899 Other long term (current) drug therapy: Secondary | ICD-10-CM | POA: Diagnosis not present

## 2023-07-09 DIAGNOSIS — J45909 Unspecified asthma, uncomplicated: Secondary | ICD-10-CM | POA: Insufficient documentation

## 2023-07-09 DIAGNOSIS — N183 Chronic kidney disease, stage 3 unspecified: Secondary | ICD-10-CM | POA: Insufficient documentation

## 2023-07-09 DIAGNOSIS — R0789 Other chest pain: Secondary | ICD-10-CM | POA: Insufficient documentation

## 2023-07-09 DIAGNOSIS — Z85828 Personal history of other malignant neoplasm of skin: Secondary | ICD-10-CM | POA: Diagnosis not present

## 2023-07-09 DIAGNOSIS — Z87891 Personal history of nicotine dependence: Secondary | ICD-10-CM | POA: Diagnosis not present

## 2023-07-09 MED ORDER — LIDOCAINE 5 % EX PTCH: 1.0000 | MEDICATED_PATCH | CUTANEOUS | 0 refills | Status: AC

## 2023-07-09 MED ORDER — ACETAMINOPHEN 500 MG PO TABS
1000.0000 mg | ORAL_TABLET | Freq: Once | ORAL | Status: AC
  Administered 2023-07-09: 1000 mg via ORAL
  Filled 2023-07-09: qty 2

## 2023-07-09 NOTE — Discharge Instructions (Addendum)
 We evaluated you for your chest wall pain.  Your x-ray was negative and did not show any sign of a collapsed lung or broken rib.   Please take at 1000 mg of Tylenol every 6 hours as needed for pain.  You can also apply ice to help with pain.  We have prescribed you some lidocaine patches, which is a sticker you can placed on the area of soreness that can help numb the area of pain.  Please return if you develop any new symptoms such as shortness of breath, cough, lightheadedness or dizziness, fainting, or any severe or worsening pain.

## 2023-07-09 NOTE — ED Triage Notes (Signed)
 Pt states he has pain to right rib cage after running into the wall recently.  Pain started yesterday. Denies hitting his head.  Pain worse with laying down. Denies any cough or SOB.
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
## 2023-07-12 ENCOUNTER — Encounter: Payer: Self-pay | Admitting: Physical Therapy

## 2023-07-12 ENCOUNTER — Ambulatory Visit: Attending: Neurosurgery | Admitting: Physical Therapy

## 2023-07-12 ENCOUNTER — Other Ambulatory Visit: Payer: Self-pay

## 2023-07-12 ENCOUNTER — Ambulatory Visit: Admitting: Physical Therapy

## 2023-07-12 DIAGNOSIS — R293 Abnormal posture: Secondary | ICD-10-CM | POA: Insufficient documentation

## 2023-07-12 DIAGNOSIS — M5459 Other low back pain: Secondary | ICD-10-CM | POA: Diagnosis present
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
## 2023-07-17 ENCOUNTER — Ambulatory Visit

## 2023-07-19 ENCOUNTER — Ambulatory Visit

## 2023-07-19 DIAGNOSIS — M5459 Other low back pain: Secondary | ICD-10-CM | POA: Diagnosis not present

## 2023-07-19 DIAGNOSIS — R293 Abnormal posture: Secondary | ICD-10-CM
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
## 2023-07-20 ENCOUNTER — Ambulatory Visit (INDEPENDENT_AMBULATORY_CARE_PROVIDER_SITE_OTHER)

## 2023-07-20 DIAGNOSIS — E538 Deficiency of other specified B group vitamins: Secondary | ICD-10-CM

## 2023-07-20 NOTE — Progress Notes (Signed)
 Patient is in office today for a nurse visit for B12 Injection. Patient Injection was given in the  Left deltoid. Patient tolerated injection well.

## 2023-07-21 ENCOUNTER — Ambulatory Visit

## 2023-07-21 DIAGNOSIS — M5459 Other low back pain: Secondary | ICD-10-CM

## 2023-07-21 DIAGNOSIS — R293 Abnormal posture: Secondary | ICD-10-CM
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
## 2023-07-26 ENCOUNTER — Ambulatory Visit: Admitting: Physical Therapy

## 2023-07-26 DIAGNOSIS — M5459 Other low back pain: Secondary | ICD-10-CM

## 2023-07-26 DIAGNOSIS — R293 Abnormal posture: Secondary | ICD-10-CM
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
## 2023-08-01 ENCOUNTER — Ambulatory Visit: Attending: Neurosurgery | Admitting: Physical Therapy

## 2023-08-01 DIAGNOSIS — M5459 Other low back pain: Secondary | ICD-10-CM | POA: Diagnosis present

## 2023-08-01 DIAGNOSIS — R293 Abnormal posture: Secondary | ICD-10-CM | POA: Diagnosis present
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
## 2023-08-08 ENCOUNTER — Ambulatory Visit: Admitting: Physical Therapy

## 2023-08-08 DIAGNOSIS — M5459 Other low back pain: Secondary | ICD-10-CM | POA: Diagnosis not present

## 2023-08-08 DIAGNOSIS — R293 Abnormal posture: Secondary | ICD-10-CM
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
## 2023-08-10 ENCOUNTER — Ambulatory Visit (INDEPENDENT_AMBULATORY_CARE_PROVIDER_SITE_OTHER): Admitting: Family Medicine

## 2023-08-10 ENCOUNTER — Encounter: Payer: Self-pay | Admitting: Family Medicine

## 2023-08-10 VITALS — BP 135/61 | HR 64 | Temp 98.1°F | Ht 76.0 in | Wt 174.0 lb

## 2023-08-10 DIAGNOSIS — N1832 Chronic kidney disease, stage 3b: Secondary | ICD-10-CM

## 2023-08-10 DIAGNOSIS — M79605 Pain in left leg: Secondary | ICD-10-CM

## 2023-08-10 DIAGNOSIS — M5416 Radiculopathy, lumbar region: Secondary | ICD-10-CM

## 2023-08-10 MED ORDER — PREDNISONE 10 MG PO TABS: ORAL_TABLET | ORAL | 0 refills | Status: DC

## 2023-08-10 NOTE — Progress Notes (Signed)
 Subjective:  Patient ID: Matthew [name], male    DOB: 1951/03/19  Age: 73 y.o. MRN: [id number]  CC: Leg Pain (Left leg outer calf burning pain. Started two weeks ago. Sensitive to touch. Losing feeling in feet as well and not sure if related.)   HPI Matthew [name] presents for pain in the left calf due to a pinched nerve in is back. Considering surgery but in the past had a creatinine go to 2.4Now having burning pain in the left calf. Last a second or two. Occurring 3-4 times a day.  At times he has trouble lifting his feet to walk his feel just kind of shuffle.  That however is not painful.     08/10/2023    3:44 PM 05/09/2023   12:03 PM 12/27/2022   12:51 PM  Depression screen PHQ 2/9  Decreased Interest 0 0 0  Down, Depressed, Hopeless 0 0 0  PHQ - 2 Score 0 0 0  Altered sleeping 1 0 0  Tired, decreased energy 0 0 0  Change in appetite 0 0 0  Feeling bad or failure about yourself  0 0 0  Trouble concentrating 0 0 0  Moving slowly or fidgety/restless 0 0 0  Suicidal thoughts 0 0 0  PHQ-9 Score 1 0 0  Difficult doing work/chores Not difficult at all Not difficult at all     History Matthew [name] has a past medical history of Agoraphobia, Anemia, Anxiety, Arthritis, Asthma, Barrett esophagus, Blood transfusion without reported diagnosis, Bronchitis, Chronic kidney disease, Complication of anesthesia, Educated about COVID-19 virus infection (12/02/2019), Elevated serum creatinine, Esophageal stricture, GERD (gastroesophageal reflux disease), Hypertension, Mood disorder (HCC), Neck pain, chronic, Neuromuscular disorder (HCC), Palpitations (12/02/2019), Rosacea, and Vitamin B12 deficiency.   He has a past surgical history that includes Chest tube insertion; Fracture surgery; Wisdom tooth extraction; Lumbar laminectomy/decompression microdiscectomy (12/06/2011); Colonoscopy (08/20/2003); Upper gastrointestinal endoscopy (03/15/2013); and skin cancer removal from forehead.   His family history  includes Diabetes in his mother; Heart disease in his mother; Stroke in his mother.He reports that he quit smoking about 38 years ago. His smoking use included cigarettes. He started smoking about 41 years ago. He has a 0.9 pack-year smoking history. He has never used smokeless tobacco. He reports that he does not drink alcohol and does not use drugs.    ROS Review of Systems  Constitutional:  Negative for fever.  Respiratory:  Negative for shortness of breath.   Cardiovascular:  Negative for chest pain.  Musculoskeletal:  Negative for arthralgias.  Skin:  Negative for rash.    Objective:  BP 135/61   Pulse 64   Temp 98.1 F (36.7 C)   Ht 6\' 4"  (1.93 m)   Wt 174 lb (78.9 kg)   SpO2 100%   BMI 21.18 kg/m   BP Readings from Last 3 Encounters:  08/10/23 135/61  07/09/23 (!) 167/91  05/09/23 132/71    Wt Readings from Last 3 Encounters:  08/10/23 174 lb (78.9 kg)  07/09/23 174 lb (78.9 kg)  05/09/23 171 lb (77.6 kg)     Physical Exam Vitals reviewed.  Constitutional:      Appearance: He is well-developed.  HENT:     Head: Normocephalic and atraumatic.     Right Ear: External ear normal.     Left Ear: External ear normal.     Mouth/Throat:     Pharynx: No oropharyngeal exudate or posterior oropharyngeal erythema.  Eyes:     Pupils: Pupils are  equal, round, and reactive to light.  Cardiovascular:     Rate and Rhythm: Normal rate and regular rhythm.     Heart sounds: No murmur heard. Pulmonary:     Effort: No respiratory distress.     Breath sounds: Normal breath sounds.  Musculoskeletal:        General: Tenderness (Mild, noted at the lateral left calf.  There is just a little bit of palpable spasm in the peroneal musculature) present.     Cervical back: Normal range of motion and neck supple.  Neurological:     Mental Status: He is alert and oriented to person, place, and time.      Assessment & Plan:  Chronic renal impairment, stage 3b (HCC) -      BMP8+EGFR  Lumbar radiculopathy -     predniSONE ; Take 5 daily for 2 days followed by 4,3,2 and 1 for 2 days each.  Dispense: 30 tablet; Refill: 0  Left leg pain -     predniSONE ; Take 5 daily for 2 days followed by 4,3,2 and 1 for 2 days each.  Dispense: 30 tablet; Refill: 0     Follow-up: Return in about 3 months (around 11/10/2023), or if symptoms worsen or fail to improve.  Roise Cleaver, M.D.

## 2023-08-11 LAB — BMP8+EGFR
BUN/Creatinine Ratio: 16 (ref 10–24)
BUN: 32 mg/dL — ABNORMAL HIGH (ref 8–27)
CO2: 19 mmol/L — ABNORMAL LOW (ref 20–29)
Calcium: 9 mg/dL (ref 8.6–10.2)
Chloride: 106 mmol/L (ref 96–106)
Creatinine, Ser: 2.04 mg/dL — ABNORMAL HIGH (ref 0.76–1.27)
Glucose: 92 mg/dL (ref 70–99)
Potassium: 4.7 mmol/L (ref 3.5–5.2)
Sodium: 139 mmol/L (ref 134–144)
eGFR: 34 mL/min/{1.73_m2} — ABNORMAL LOW (ref >59)

## 2023-08-13 ENCOUNTER — Ambulatory Visit: Payer: Self-pay | Admitting: Family Medicine

## 2023-08-15 ENCOUNTER — Ambulatory Visit

## 2023-08-15 DIAGNOSIS — M5459 Other low back pain: Secondary | ICD-10-CM | POA: Diagnosis not present

## 2023-08-15 DIAGNOSIS — R293 Abnormal posture: Secondary | ICD-10-CM
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
## 2023-08-17 ENCOUNTER — Telehealth: Payer: Self-pay

## 2023-08-17 NOTE — Telephone Encounter (Signed)
 Copied from CRM [id number]. Topic: General - Other >> Aug 17, 2023 12:31 PM Matthew [name] wrote: Reason for CRM: Patient called. Has concerns about bill passing to cut insurance. He is worried about his medications and costs. I let him know he may not be affected and to give a call back if he received any documentation about changes to his insurance or benefits. Thank You

## 2023-08-17 NOTE — Telephone Encounter (Signed)
 Noted. lS

## 2023-08-22 ENCOUNTER — Ambulatory Visit

## 2023-08-23 ENCOUNTER — Ambulatory Visit

## 2023-08-23 DIAGNOSIS — R293 Abnormal posture: Secondary | ICD-10-CM

## 2023-08-23 DIAGNOSIS — M5459 Other low back pain: Secondary | ICD-10-CM | POA: Diagnosis not present
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
## 2023-08-25 ENCOUNTER — Ambulatory Visit (INDEPENDENT_AMBULATORY_CARE_PROVIDER_SITE_OTHER)

## 2023-08-25 DIAGNOSIS — E538 Deficiency of other specified B group vitamins: Secondary | ICD-10-CM | POA: Diagnosis not present

## 2023-08-29 ENCOUNTER — Encounter: Payer: Self-pay | Admitting: Family Medicine

## 2023-08-29 ENCOUNTER — Ambulatory Visit: Attending: Neurosurgery | Admitting: Physical Therapy

## 2023-08-29 ENCOUNTER — Ambulatory Visit: Payer: Self-pay

## 2023-08-29 ENCOUNTER — Ambulatory Visit: Admitting: Family Medicine

## 2023-08-29 DIAGNOSIS — M5416 Radiculopathy, lumbar region: Secondary | ICD-10-CM

## 2023-08-29 DIAGNOSIS — R293 Abnormal posture: Secondary | ICD-10-CM | POA: Diagnosis present

## 2023-08-29 DIAGNOSIS — M79605 Pain in left leg: Secondary | ICD-10-CM | POA: Diagnosis not present

## 2023-08-29 DIAGNOSIS — M5459 Other low back pain: Secondary | ICD-10-CM | POA: Insufficient documentation

## 2023-08-29 MED ORDER — PREDNISONE 10 MG PO TABS: ORAL_TABLET | ORAL | 0 refills | Status: DC

## 2023-08-29 NOTE — Telephone Encounter (Signed)
 Copied from CRM [id number]. Topic: Clinical - Red Word Triage >> Aug 29, 2023  9:56 AM Baldemar Lev wrote: Red Word that prompted transfer to Nurse Triage: Pt is experiencing pain that is worsening since he was seen. He says its inflammation in his leg.   Chief Complaint: Leg pain Symptoms: Left leg pain Frequency: Constant  Disposition: [] ED /[] Urgent Care (no appt availability in office) / [x] Appointment(In office/virtual)/ []  [facility]/ [] Home Care/ [] Refused Recommended Disposition /[] [facility]/ []  Follow-up with PCP Additional Notes: Patient reports he was seen last month for leg pain and was told to return if his pain began to worsen. He states he has been experiencing worsening pain for the last couple of days and would like to make an appointment for re-evaluation. He denies any new symptoms at this time. Appointment made for today for evaluation.     Reason for Disposition  [1] MODERATE pain (e.g., interferes with normal activities, limping) AND [2] present > 3 days  Answer Assessment - Initial Assessment Questions 1. ONSET: "When did the pain start?"      Chronic problem  2. LOCATION: "Where is the pain located?"      Left leg 3. PAIN: "How bad is the pain?"    (Scale 1-10; or mild, moderate, severe)   -  MILD (1-3): doesn't interfere with normal activities    -  MODERATE (4-7): interferes with normal activities (e.g., work or school) or awakens from sleep, limping    -  SEVERE (8-10): excruciating pain, unable to do any normal activities, unable to walk     Moderate  4. WORK OR EXERCISE: "Has there been any recent work or exercise that involved this part of the body?"      No 5. CAUSE: "What do you think is causing the leg pain?"     Believes might be due to inflammation  Protocols used: Leg Pain-A-AH
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
## 2023-08-29 NOTE — Progress Notes (Signed)
 Subjective:  Patient ID: Matthew [name], male    DOB: June 13, 1950  Age: 73 y.o. MRN: [id number]  CC: No chief complaint on file.   HPI Matthew [name] presents for burning pain lateral left leg near knee. Comes and goes. Lasts a second or two. Occurs daily intermittently. Relieved with prednisone . Came back 5-6 days ago.      08/29/2023    3:54 PM 08/10/2023    3:44 PM 05/09/2023   12:03 PM  Depression screen PHQ 2/9  Decreased Interest 0 0 0  Down, Depressed, Hopeless 0 0 0  PHQ - 2 Score 0 0 0  Altered sleeping  1 0  Tired, decreased energy  0 0  Change in appetite  0 0  Feeling bad or failure about yourself   0 0  Trouble concentrating  0 0  Moving slowly or fidgety/restless  0 0  Suicidal thoughts  0 0  PHQ-9 Score  1 0  Difficult doing work/chores  Not difficult at all Not difficult at all    History Matthew [name] has a past medical history of Agoraphobia, Anemia, Anxiety, Arthritis, Asthma, Barrett esophagus, Blood transfusion without reported diagnosis, Bronchitis, Chronic kidney disease, Complication of anesthesia, Educated about COVID-19 virus infection (12/02/2019), Elevated serum creatinine, Esophageal stricture, GERD (gastroesophageal reflux disease), Hypertension, Mood disorder (HCC), Neck pain, chronic, Neuromuscular disorder (HCC), Palpitations (12/02/2019), Rosacea, and Vitamin B12 deficiency.   He has a past surgical history that includes Chest tube insertion; Fracture surgery; Wisdom tooth extraction; Lumbar laminectomy/decompression microdiscectomy (12/06/2011); Colonoscopy (08/20/2003); Upper gastrointestinal endoscopy (03/15/2013); and skin cancer removal from forehead.   His family history includes Diabetes in his mother; Heart disease in his mother; Stroke in his mother.He reports that he quit smoking about 38 years ago. His smoking use included cigarettes. He started smoking about 41 years ago. He has a 0.9 pack-year smoking history. He has never used smokeless tobacco. He  reports that he does not drink alcohol and does not use drugs.    ROS Review of Systems  Constitutional:  Negative for fever.  Musculoskeletal:  Negative for arthralgias.  Skin:  Negative for rash.  Neurological:  Negative for weakness and numbness.    Objective:  BP (!) 170/66   Pulse 65   Temp 97.8 F (36.6 C)   Ht 6\' 4"  (1.93 m)   Wt 179 lb (81.2 kg)   SpO2 100%   BMI 21.79 kg/m   BP Readings from Last 3 Encounters:  08/29/23 (!) 170/66  08/10/23 135/61  07/09/23 (!) 167/91    Wt Readings from Last 3 Encounters:  08/29/23 179 lb (81.2 kg)  08/10/23 174 lb (78.9 kg)  07/09/23 174 lb (78.9 kg)     Physical Exam Vitals reviewed.  Constitutional:      Appearance: He is well-developed.  HENT:     Head: Normocephalic and atraumatic.     Right Ear: External ear normal.     Left Ear: External ear normal.  Eyes:     Pupils: Pupils are equal, round, and reactive to light.  Cardiovascular:     Rate and Rhythm: Normal rate and regular rhythm.     Heart sounds: No murmur heard. Pulmonary:     Effort: No respiratory distress.     Breath sounds: Normal breath sounds.  Musculoskeletal:        General: Tenderness (left lateral leg just inferior to knee.) present.     Cervical back: Normal range of motion and neck supple.  Neurological:  Mental Status: He is alert and oriented to person, place, and time.      Assessment & Plan:  Lumbar radiculopathy -     predniSONE ; Take 5 daily for 2 days followed by 4,3,2 and 1 for 2 days each.  Dispense: 30 tablet; Refill: 0  Left leg pain -     predniSONE ; Take 5 daily for 2 days followed by 4,3,2 and 1 for 2 days each.  Dispense: 30 tablet; Refill: 0     Follow-up: Return if symptoms worsen or fail to improve.  Roise Cleaver, M.D.

## 2023-08-30 ENCOUNTER — Telehealth: Payer: Self-pay

## 2023-08-30 ENCOUNTER — Ambulatory Visit: Payer: Self-pay

## 2023-08-30 NOTE — Telephone Encounter (Signed)
 Attempted to call patient regard concerns with tetanus shot.  Patient was already called and addressed by clinical personal in office.   No additional questions/concerns noted during the time of the call.

## 2023-08-30 NOTE — Telephone Encounter (Signed)
 Copied from CRM [id number]. Topic: General - Other >> Aug 30, 2023  7:55 AM Essie A wrote: Reason for CRM: Patient said he was got a scratch on his right leg on his calf from a wire sticking out from his box spring.  He wants to know if he needs to get a tetanus shot.  Please return his cal at [phone].

## 2023-08-30 NOTE — Telephone Encounter (Addendum)
 Informed pt that he is UTD on tetanus shot. Pt denies any redness, swelling, or pain in area of leg. Advised pt to call back if he develops any symptoms. Pt verbalized understanding

## 2023-09-05 ENCOUNTER — Encounter: Admitting: Physical Therapy

## 2023-09-08 ENCOUNTER — Other Ambulatory Visit: Payer: Self-pay | Admitting: Psychiatry

## 2023-09-08 DIAGNOSIS — F4001 Agoraphobia with panic disorder: Secondary | ICD-10-CM

## 2023-09-08 DIAGNOSIS — G2119 Other drug induced secondary parkinsonism: Secondary | ICD-10-CM

## 2023-09-08 DIAGNOSIS — F259 Schizoaffective disorder, unspecified: Secondary | ICD-10-CM

## 2023-09-12 ENCOUNTER — Encounter: Payer: Self-pay | Admitting: Physical Therapy

## 2023-09-12 ENCOUNTER — Ambulatory Visit: Admitting: Physical Therapy

## 2023-09-12 DIAGNOSIS — R293 Abnormal posture: Secondary | ICD-10-CM

## 2023-09-12 DIAGNOSIS — M5459 Other low back pain: Secondary | ICD-10-CM | POA: Diagnosis not present
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
## 2023-09-19 ENCOUNTER — Ambulatory Visit
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
## 2023-09-21 ENCOUNTER — Encounter: Admitting: Physical Therapy

## 2023-09-22 ENCOUNTER — Encounter: Payer: Self-pay | Admitting: *Deleted

## 2023-09-22 ENCOUNTER — Ambulatory Visit: Admitting: *Deleted

## 2023-09-22 DIAGNOSIS — M5459 Other low back pain: Secondary | ICD-10-CM

## 2023-09-22 DIAGNOSIS — R293 Abnormal posture: Secondary | ICD-10-CM
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
## 2023-09-26 ENCOUNTER — Ambulatory Visit (INDEPENDENT_AMBULATORY_CARE_PROVIDER_SITE_OTHER)

## 2023-09-26 ENCOUNTER — Encounter: Admitting: Physical Therapy

## 2023-09-26 DIAGNOSIS — E538 Deficiency of other specified B group vitamins: Secondary | ICD-10-CM

## 2023-09-26 NOTE — Progress Notes (Signed)
 Patient is in office today for a nurse visit for B12 Injection. Patient Injection was given in the  Left deltoid. Patient tolerated injection well.

## 2023-10-01 ENCOUNTER — Other Ambulatory Visit: Payer: Self-pay | Admitting: Psychiatry

## 2023-10-01 DIAGNOSIS — F259 Schizoaffective disorder, unspecified: Secondary | ICD-10-CM

## 2023-10-03 ENCOUNTER — Ambulatory Visit: Attending: Neurosurgery | Admitting: *Deleted

## 2023-10-03 ENCOUNTER — Telehealth: Payer: Self-pay | Admitting: Physician Assistant

## 2023-10-03 ENCOUNTER — Encounter: Payer: Self-pay | Admitting: *Deleted

## 2023-10-03 DIAGNOSIS — R293 Abnormal posture: Secondary | ICD-10-CM | POA: Diagnosis present

## 2023-10-03 DIAGNOSIS — M5459 Other low back pain: Secondary | ICD-10-CM | POA: Insufficient documentation
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
## 2023-10-03 NOTE — Telephone Encounter (Signed)
 Pt called after hours service, reporting misplaced medications. When I returned his call, he stated he found them and didn't need anything.

## 2023-10-30 ENCOUNTER — Ambulatory Visit (INDEPENDENT_AMBULATORY_CARE_PROVIDER_SITE_OTHER): Admitting: *Deleted

## 2023-10-30 DIAGNOSIS — E538 Deficiency of other specified B group vitamins: Secondary | ICD-10-CM

## 2023-10-30 NOTE — Progress Notes (Signed)
 Patient is in office today for a nurse visit for B12 Injection. Patient Injection was given in the  Right deltoid. Patient tolerated injection well.

## 2023-11-02 ENCOUNTER — Telehealth: Payer: Self-pay | Admitting: *Deleted

## 2023-11-02 ENCOUNTER — Other Ambulatory Visit: Payer: Self-pay | Admitting: Family Medicine

## 2023-11-02 DIAGNOSIS — E782 Mixed hyperlipidemia: Secondary | ICD-10-CM

## 2023-11-02 DIAGNOSIS — K2100 Gastro-esophageal reflux disease with esophagitis, without bleeding: Secondary | ICD-10-CM

## 2023-11-02 DIAGNOSIS — I10 Essential (primary) hypertension: Secondary | ICD-10-CM

## 2023-11-02 DIAGNOSIS — N1832 Chronic kidney disease, stage 3b: Secondary | ICD-10-CM

## 2023-11-02 NOTE — Telephone Encounter (Signed)
 Labs ordered.

## 2023-11-02 NOTE — Telephone Encounter (Signed)
 Copied from CRM [id number]. Topic: Clinical - Request for Lab/Test Order >> Nov 02, 2023  8:42 AM Myrick T wrote: Reason for CRM: patient wants to come in for labs before his appt on Monday. Please f/u with patient

## 2023-11-03 ENCOUNTER — Other Ambulatory Visit

## 2023-11-03 ENCOUNTER — Other Ambulatory Visit: Payer: Self-pay | Admitting: Nurse Practitioner

## 2023-11-03 DIAGNOSIS — E782 Mixed hyperlipidemia: Secondary | ICD-10-CM

## 2023-11-03 DIAGNOSIS — I10 Essential (primary) hypertension: Secondary | ICD-10-CM

## 2023-11-03 LAB — LIPID PANEL

## 2023-11-03 NOTE — Telephone Encounter (Signed)
Attempted to contact patient with no answer.

## 2023-11-03 NOTE — Telephone Encounter (Signed)
 I called and spoke with patient and got him scheduled to come in for labs today at 3:15 PM.

## 2023-11-04 LAB — CBC WITH DIFFERENTIAL/PLATELET
Basophils Absolute: 0.1 x10E3/uL (ref 0.0–0.2)
Basos: 1 %
EOS (ABSOLUTE): 0.1 x10E3/uL (ref 0.0–0.4)
Eos: 2 %
Hematocrit: 34.8 % — ABNORMAL LOW (ref 37.5–51.0)
Hemoglobin: 11.3 g/dL — ABNORMAL LOW (ref 13.0–17.7)
Immature Grans (Abs): 0 x10E3/uL (ref 0.0–0.1)
Immature Granulocytes: 0 %
Lymphocytes Absolute: 0.7 x10E3/uL (ref 0.7–3.1)
Lymphs: 15 %
MCH: 29.8 pg (ref 26.6–33.0)
MCHC: 32.5 g/dL (ref 31.5–35.7)
MCV: 92 fL (ref 79–97)
Monocytes Absolute: 0.5 x10E3/uL (ref 0.1–0.9)
Monocytes: 11 %
Neutrophils Absolute: 3.5 x10E3/uL (ref 1.4–7.0)
Neutrophils: 71 %
Platelets: 190 x10E3/uL (ref 150–450)
RBC: 3.79 x10E6/uL — ABNORMAL LOW (ref 4.14–5.80)
RDW: 12.4 % (ref 11.6–15.4)
WBC: 5 x10E3/uL (ref 3.4–10.8)

## 2023-11-04 LAB — LIPID PANEL
Chol/HDL Ratio: 2.8 ratio (ref 0.0–5.0)
Cholesterol, Total: 153 mg/dL (ref 100–199)
HDL: 54 mg/dL (ref >39)
LDL Chol Calc (NIH): 79 mg/dL (ref 0–99)
Triglycerides: 114 mg/dL (ref 0–149)
VLDL Cholesterol Cal: 20 mg/dL (ref 5–40)

## 2023-11-04 LAB — CMP14+EGFR
ALT: 19 IU/L (ref 0–44)
AST: 21 IU/L (ref 0–40)
Albumin: 4.1 g/dL (ref 3.8–4.8)
Alkaline Phosphatase: 115 IU/L (ref 44–121)
BUN/Creatinine Ratio: 13 (ref 10–24)
BUN: 23 mg/dL (ref 8–27)
Bilirubin Total: 0.3 mg/dL (ref 0.0–1.2)
CO2: 24 mmol/L (ref 20–29)
Calcium: 9.3 mg/dL (ref 8.6–10.2)
Chloride: 103 mmol/L (ref 96–106)
Creatinine, Ser: 1.82 mg/dL — ABNORMAL HIGH (ref 0.76–1.27)
Globulin, Total: 2.4 g/dL (ref 1.5–4.5)
Glucose: 97 mg/dL (ref 70–99)
Potassium: 5.5 mmol/L — ABNORMAL HIGH (ref 3.5–5.2)
Sodium: 139 mmol/L (ref 134–144)
Total Protein: 6.5 g/dL (ref 6.0–8.5)
eGFR: 39 mL/min/1.73 — ABNORMAL LOW (ref >59)

## 2023-11-06 ENCOUNTER — Ambulatory Visit: Payer: Medicare Other | Admitting: Family Medicine

## 2023-11-06 ENCOUNTER — Encounter: Payer: Self-pay | Admitting: Family Medicine

## 2023-11-06 VITALS — BP 131/63 | HR 64 | Temp 97.0°F | Ht 76.0 in | Wt 173.0 lb

## 2023-11-06 DIAGNOSIS — I10 Essential (primary) hypertension: Secondary | ICD-10-CM

## 2023-11-06 DIAGNOSIS — S80811A Abrasion, right lower leg, initial encounter: Secondary | ICD-10-CM | POA: Diagnosis not present

## 2023-11-06 DIAGNOSIS — F259 Schizoaffective disorder, unspecified: Secondary | ICD-10-CM

## 2023-11-06 DIAGNOSIS — N1832 Chronic kidney disease, stage 3b: Secondary | ICD-10-CM | POA: Insufficient documentation

## 2023-11-06 DIAGNOSIS — E559 Vitamin D deficiency, unspecified: Secondary | ICD-10-CM

## 2023-11-06 DIAGNOSIS — F411 Generalized anxiety disorder: Secondary | ICD-10-CM

## 2023-11-06 DIAGNOSIS — M549 Dorsalgia, unspecified: Secondary | ICD-10-CM

## 2023-11-06 NOTE — Progress Notes (Signed)
 Subjective:  Patient ID: Matthew [name], male    DOB: Jan 10, 1951  Age: 73 y.o. MRN: [id number]  CC: Medical Management of Chronic Issues   HPI   Discussed the use of AI scribe software for clinical note transcription with the patient, who gave verbal consent to proceed.  History of Present Illness   Discussed the use of AI scribe software for clinical note transcription with the patient, who gave verbal consent to proceed.  History of Present Illness   Matthew [name] is a 73 year old male who presents with a scratch on his leg and back pain.  He has a scratch on his leg, approximately five centimeters long, caused by a wire sticking out from his bed. He applies Neosporin, a triple antibiotic ointment, twice daily, once in the morning and once at night.  He experiences back pain that improves with walking for about half a day. He has a history of back surgery that previously improved his ability to walk. He wishes to avoid further surgery unless the pain becomes intolerable. The pain is not severe enough to warrant surgery at this time.  He takes lorazepam  four times a day for anxiety, a regimen he has been on for about forty years. He also takes esomeprazole  in the morning and famotidine  at night for heartburn and indigestion, with a previous adjustment in his medication dosage from 40 mg to 20 mg of famotidine . He is on a long-term medication regimen including trihexyphenidyl , carbamazepine , and Thorazine , which he has been taking for over forty years. These medications are managed by his psychiatrist.  He takes vitamin D  supplements, which were adjusted from a prescription to an over-the-counter form by his kidney specialist.          11/06/2023   11:29 AM 08/29/2023    3:54 PM 08/10/2023    3:44 PM  Depression screen PHQ 2/9  Decreased Interest 0 0 0  Down, Depressed, Hopeless 0 0 0  PHQ - 2 Score 0 0 0  Altered sleeping   1  Tired, decreased energy   0  Change in appetite   0   Feeling bad or failure about yourself    0  Trouble concentrating   0  Moving slowly or fidgety/restless   0  Suicidal thoughts   0  PHQ-9 Score   1  Difficult doing work/chores   Not difficult at all    History Jujhar has a past medical history of Agoraphobia, Anemia, Anxiety, Arthritis, Asthma, Barrett esophagus, Blood transfusion without reported diagnosis, Bronchitis, Chronic kidney disease, Complication of anesthesia, Educated about COVID-19 virus infection (12/02/2019), Elevated serum creatinine, Esophageal stricture, GERD (gastroesophageal reflux disease), Hypertension, Mood disorder (HCC), Neck pain, chronic, Neuromuscular disorder (HCC), Palpitations (12/02/2019), Rosacea, and Vitamin B12 deficiency.   He has a past surgical history that includes Chest tube insertion; Fracture surgery; Wisdom tooth extraction; Lumbar laminectomy/decompression microdiscectomy (12/06/2011); Colonoscopy (08/20/2003); Upper gastrointestinal endoscopy (03/15/2013); and skin cancer removal from forehead.   His family history includes Diabetes in his mother; Heart disease in his mother; Stroke in his mother.He reports that he quit smoking about 38 years ago. His smoking use included cigarettes. He started smoking about 41 years ago. He has a 0.9 pack-year smoking history. He has never used smokeless tobacco. He reports that he does not drink alcohol and does not use drugs.    ROS Review of Systems  Constitutional:  Negative for fever.  Respiratory:  Negative for shortness of breath.   Cardiovascular:  Negative for chest pain.  Musculoskeletal:  Negative for arthralgias.  Skin:  Negative for rash.  Psychiatric/Behavioral:  The patient is nervous/anxious.     Objective:  BP 131/63   Pulse 64   Temp (!) 97 F (36.1 C)   Ht 6' 4 (1.93 m)   Wt 173 lb (78.5 kg)   SpO2 100%   BMI 21.06 kg/m   BP Readings from Last 3 Encounters:  11/06/23 131/63  08/29/23 (!) 170/66  08/10/23 135/61    Wt  Readings from Last 3 Encounters:  11/06/23 173 lb (78.5 kg)  08/29/23 179 lb (81.2 kg)  08/10/23 174 lb (78.9 kg)     Physical Exam Vitals reviewed.  Constitutional:      Appearance: He is well-developed.  HENT:     Head: Normocephalic and atraumatic.     Right Ear: External ear normal.     Left Ear: External ear normal.     Mouth/Throat:     Pharynx: No oropharyngeal exudate or posterior oropharyngeal erythema.  Eyes:     Pupils: Pupils are equal, round, and reactive to light.  Cardiovascular:     Rate and Rhythm: Normal rate and regular rhythm.     Heart sounds: No murmur heard. Pulmonary:     Effort: No respiratory distress.     Breath sounds: Normal breath sounds.  Musculoskeletal:     Cervical back: Normal range of motion and neck supple.  Neurological:     Mental Status: He is alert and oriented to person, place, and time.   Physical Exam   HEENT: Throat normal, dentition normal. CHEST: Lungs clear to auscultation bilaterally. CARDIOVASCULAR: Heart sounds regular, no murmurs. ABDOMEN: Tenderness in left lower quadrant on deep palpation. EXTREMITIES: No ankle edema. NEUROLOGICAL: Pupils equal and reactive to light. Extraocular movements intact. SKIN: 5 cm scratch with scab, no infection. Skin tone normal, hands and nails normal.       Results for orders placed or performed in visit on 11/03/23  CBC with Differential/Platelet   Collection Time: 11/03/23  3:19 PM  Result Value Ref Range   WBC 5.0 3.4 - 10.8 x10E3/uL   RBC 3.79 (L) 4.14 - 5.80 x10E6/uL   Hemoglobin 11.3 (L) 13.0 - 17.7 g/dL   Hematocrit 65.1 (L) 62.4 - 51.0 %   MCV 92 79 - 97 fL   MCH 29.8 26.6 - 33.0 pg   MCHC 32.5 31.5 - 35.7 g/dL   RDW 87.5 88.3 - 84.5 %   Platelets 190 150 - 450 x10E3/uL   Neutrophils 71 Not Estab. %   Lymphs 15 Not Estab. %   Monocytes 11 Not Estab. %   Eos 2 Not Estab. %   Basos 1 Not Estab. %   Neutrophils Absolute 3.5 1.4 - 7.0 x10E3/uL   Lymphocytes Absolute 0.7  0.7 - 3.1 x10E3/uL   Monocytes Absolute 0.5 0.1 - 0.9 x10E3/uL   EOS (ABSOLUTE) 0.1 0.0 - 0.4 x10E3/uL   Basophils Absolute 0.1 0.0 - 0.2 x10E3/uL   Immature Granulocytes 0 Not Estab. %   Immature Grans (Abs) 0.0 0.0 - 0.1 x10E3/uL  CMP14+EGFR   Collection Time: 11/03/23  3:19 PM  Result Value Ref Range   Glucose 97 70 - 99 mg/dL   BUN 23 8 - 27 mg/dL   Creatinine, Ser 8.17 (H) 0.76 - 1.27 mg/dL   eGFR 39 (L) >40 fO/fpw/8.26   BUN/Creatinine Ratio 13 10 - 24   Sodium 139 134 - 144 mmol/L   Potassium  5.5 (H) 3.5 - 5.2 mmol/L   Chloride 103 96 - 106 mmol/L   CO2 24 20 - 29 mmol/L   Calcium 9.3 8.6 - 10.2 mg/dL   Total Protein 6.5 6.0 - 8.5 g/dL   Albumin 4.1 3.8 - 4.8 g/dL   Globulin, Total 2.4 1.5 - 4.5 g/dL   Bilirubin Total 0.3 0.0 - 1.2 mg/dL   Alkaline Phosphatase 115 44 - 121 IU/L   AST 21 0 - 40 IU/L   ALT 19 0 - 44 IU/L  Lipid panel   Collection Time: 11/03/23  3:19 PM  Result Value Ref Range   Cholesterol, Total 153 100 - 199 mg/dL   Triglycerides 885 0 - 149 mg/dL   HDL 54 >60 mg/dL   VLDL Cholesterol Cal 20 5 - 40 mg/dL   LDL Chol Calc (NIH) 79 0 - 99 mg/dL   Chol/HDL Ratio 2.8 0.0 - 5.0 ratio     ) Assessment & Plan:  Essential hypertension  Vitamin D  deficiency  Abrasion of right lower extremity, initial encounter  Schizoaffective disorder, unspecified type (HCC)  Back pain without sciatica  GAD (generalized anxiety disorder)  Stage 3b chronic kidney disease (HCC)    Assessment and Plan    Assessment and Plan    Healing abrasion of buttock Abrasion approximately 5 cm, healing well with scab, no infection signs. - Continue Neosporin twice daily. - Monitor for infection signs: increased redness, swelling, pus.  Chronic low back pain, post-surgical Pain persists post-surgery, manageable, no current surgical need. Prefers to avoid surgery unless pain intolerable. Delaying surgery could limit future options. - Discuss surgery option if pain  intolerable, considering future health conditions.  Chronic anxiety treated with lorazepam  Managed with lorazepam  for 40 years, 4 doses daily. Reduction considered but withdrawal or destabilization is a concern. - Continue lorazepam  regimen as prescribed by psychiatrist. - Discuss medication side effects or balance issues with psychiatrist.  Gastroesophageal reflux disease managed with esomeprazole  and famotidine  GERD managed effectively with esomeprazole  and famotidine . - Continue esomeprazole  in the morning and famotidine  20 mg at night.  Stage 3b chronic kidney disease Stage 3b CKD with EGFR 39%, monitored by nephrologist. - Continue regular nephrologist follow-up for kidney function monitoring.  Mild anemia Mild anemia noted, appears harmless and stable.             Follow-up: Return in about 6 months (around 05/08/2024) for [facility] physical.  Butler Der, M.D.

## 2023-11-07 ENCOUNTER — Other Ambulatory Visit: Payer: Self-pay | Admitting: Family Medicine

## 2023-11-07 ENCOUNTER — Encounter: Payer: Self-pay | Admitting: Psychiatry

## 2023-11-07 ENCOUNTER — Ambulatory Visit: Admitting: Psychiatry

## 2023-11-07 ENCOUNTER — Telehealth: Payer: Self-pay

## 2023-11-07 DIAGNOSIS — G2119 Other drug induced secondary parkinsonism: Secondary | ICD-10-CM

## 2023-11-07 DIAGNOSIS — G3184 Mild cognitive impairment, so stated: Secondary | ICD-10-CM | POA: Diagnosis not present

## 2023-11-07 DIAGNOSIS — F411 Generalized anxiety disorder: Secondary | ICD-10-CM

## 2023-11-07 DIAGNOSIS — F4001 Agoraphobia with panic disorder: Secondary | ICD-10-CM | POA: Diagnosis not present

## 2023-11-07 DIAGNOSIS — F259 Schizoaffective disorder, unspecified: Secondary | ICD-10-CM | POA: Diagnosis not present

## 2023-11-07 DIAGNOSIS — Z87820 Personal history of traumatic brain injury: Secondary | ICD-10-CM

## 2023-11-07 NOTE — Telephone Encounter (Signed)
 Copied from CRM #[id number]. Topic: Clinical - Lab/Test Results >> Nov 07, 2023  3:00 PM Jasmin G wrote: Reason for CRM: Pt would like a call back from PCP, Dr. Zollie or one of the nurses to discuss recent blood work done, call back at [phone].
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
## 2023-11-08 ENCOUNTER — Ambulatory Visit: Payer: Self-pay | Admitting: Family Medicine

## 2023-11-09 ENCOUNTER — Telehealth: Payer: Self-pay | Admitting: Family Medicine

## 2023-11-09 NOTE — Telephone Encounter (Signed)
 Patient aware and verbalized understanding.

## 2023-11-09 NOTE — Telephone Encounter (Signed)
 Copied from CRM [id number]. Topic: General - Call Back - No Documentation >> Nov 08, 2023  5:39 PM Harlene ORN wrote: Reason for CRM: Patient is returning his call from the Practice.

## 2023-11-09 NOTE — Telephone Encounter (Signed)
 Reviewed lab results. Patient aware and verbalized understanding.

## 2023-12-01 ENCOUNTER — Ambulatory Visit (INDEPENDENT_AMBULATORY_CARE_PROVIDER_SITE_OTHER)

## 2023-12-01 DIAGNOSIS — E538 Deficiency of other specified B group vitamins: Secondary | ICD-10-CM | POA: Diagnosis not present

## 2023-12-01 NOTE — Progress Notes (Signed)
 Patient is in office today for a nurse visit for B12 Injection. Patient Injection was given in the  Left deltoid. Patient tolerated injection well.

## 2023-12-02 ENCOUNTER — Encounter: Payer: Self-pay | Admitting: Psychiatry

## 2023-12-02 LAB — VITAMIN B12: Vitamin B-12: 573 pg/mL (ref 232–1245)

## 2023-12-02 NOTE — Progress Notes (Signed)
 Patient called stating he took his fourth dose of his QID medicines about an hour after his third dose, rather than 4 hours after as he usually does. He denies any physical differences or other complaints. Reassured him that this should not have any major impact, and provided reassurance. Encouraged him to call again if any other concerning symptoms or signs show.

## 2023-12-03 ENCOUNTER — Ambulatory Visit: Payer: Self-pay | Admitting: Family Medicine

## 2023-12-03 NOTE — Progress Notes (Signed)
Hello Matthew [name],  Your lab result is normal and/or stable.Some minor variations that are not significant are commonly marked abnormal, but do not represent any medical problem for you.  Best regards, Helon Wisinski, M.D.

## 2023-12-04 ENCOUNTER — Other Ambulatory Visit: Payer: Self-pay | Admitting: Family Medicine

## 2023-12-04 DIAGNOSIS — K2100 Gastro-esophageal reflux disease with esophagitis, without bleeding: Secondary | ICD-10-CM

## 2023-12-04 DIAGNOSIS — I10 Essential (primary) hypertension: Secondary | ICD-10-CM

## 2023-12-07 ENCOUNTER — Other Ambulatory Visit: Payer: Self-pay | Admitting: Psychiatry

## 2023-12-07 DIAGNOSIS — F4001 Agoraphobia with panic disorder: Secondary | ICD-10-CM

## 2023-12-21 ENCOUNTER — Telehealth: Payer: Self-pay

## 2023-12-21 NOTE — Telephone Encounter (Signed)
 Received Telephone Advice Record from Access Nurse via fax, stating that patient had called after hours reporting middle/lower back pain that radiates to his left side, that has been going on for 2-3 years, with current pain of 3/10.  Tried calling patient to talk with him about this but no answer and no option to leave a message. If patient calls back, he can make an appt with his PCP, Dr Zollie. Currently, it looks like Dr Zollie first available is Oct 8th.

## 2023-12-22 ENCOUNTER — Ambulatory Visit

## 2023-12-22 ENCOUNTER — Encounter: Payer: Self-pay | Admitting: Family Medicine

## 2023-12-22 VITALS — BP 136/71 | HR 66 | Temp 97.7°F | Ht 76.0 in | Wt 177.6 lb

## 2023-12-22 DIAGNOSIS — M5416 Radiculopathy, lumbar region: Secondary | ICD-10-CM

## 2023-12-22 DIAGNOSIS — M48061 Spinal stenosis, lumbar region without neurogenic claudication: Secondary | ICD-10-CM | POA: Diagnosis not present

## 2023-12-22 NOTE — Progress Notes (Signed)
   Acute Office Visit  Subjective:     Patient ID: ELBA DENDINGER, male    DOB: 11-24-1950, 73 y.o.   MRN: [id number]  Chief Complaint  Patient presents with   Back Pain    Back Pain    History of Present Illness   Kevon Tench Cerami is a 73 year old male with chronic back pain who presents with new onset left leg pain and urinary urgency.  Chronic back pain - Chronic back pain present for several years, attributed to a pinched nerve - History of spinal surgery approximately ten years ago; unsure of surgical details  Left lower extremity pain and weakness - New onset left leg pain for approximately three weeks, primarily in the hip region - Difficulty walking; unable to walk far distances - Left leg gives way with associated pain - No numbness or tingling in the left leg - No numbness or tingling in the pelvic region - No bowel or bladder incontinence       Review of Systems  Musculoskeletal:  Positive for back pain.   As per HPI.      Objective:    BP 136/71   Pulse 66   Temp 97.7 F (36.5 C) (Temporal)   Ht 6' 4 (1.93 m)   Wt 177 lb 9.6 oz (80.6 kg)   SpO2 98%   BMI 21.62 kg/m    Physical Exam Vitals and nursing note reviewed.  Constitutional:      General: He is not in acute distress.    Appearance: He is not ill-appearing, toxic-appearing or diaphoretic.  Cardiovascular:     Pulses: Normal pulses.     Heart sounds: No murmur heard. Musculoskeletal:     Right lower leg: No edema.     Left lower leg: No edema.  Neurological:     Mental Status: He is alert and oriented to person, place, and time.     Motor: Weakness (left leg) present.     Gait: Gait abnormal (antalgic gait).  Psychiatric:        Mood and Affect: Mood normal.        Behavior: Behavior normal.     No results found for any visits on 12/22/23.      Assessment & Plan:   Junie was seen today for back pain.  Diagnoses and all orders for this visit:  Lumbar  radiculopathy  Spinal stenosis of lumbar region, unspecified whether neurogenic claudication present      Lumbar spinal stenosis with left leg weakness and pain Chronic lumbar spinal stenosis with recent exacerbation causing left leg weakness and pain. MRI from 2024 shows severe lumbar stenosis and broad based bulging disc. Established with spine specialist who has recommended surgery. Ranbir does not wish to have another lumbar surgery. Discussed that surgical intervention necessary due to symptom progression and risk of leg mobility loss. - Keep scheduled appt with spine specialist early next week.  - Advise to go to ER for saddle anesthesia, bowel or bladder incontinence      The patient indicates understanding of these issues and agrees with the plan.  Annabella CHRISTELLA Search, FNP

## 2023-12-25 ENCOUNTER — Other Ambulatory Visit (HOSPITAL_COMMUNITY): Payer: Self-pay | Admitting: Neurosurgery

## 2023-12-25 DIAGNOSIS — M48062 Spinal stenosis, lumbar region with neurogenic claudication: Secondary | ICD-10-CM

## 2023-12-26 ENCOUNTER — Other Ambulatory Visit (HOSPITAL_COMMUNITY): Payer: Self-pay | Admitting: Neurosurgery

## 2023-12-26 DIAGNOSIS — M4316 Spondylolisthesis, lumbar region: Secondary | ICD-10-CM

## 2023-12-26 DIAGNOSIS — M810 Age-related osteoporosis without current pathological fracture: Secondary | ICD-10-CM

## 2024-01-01 ENCOUNTER — Ambulatory Visit

## 2024-01-01 ENCOUNTER — Ambulatory Visit: Payer: Self-pay

## 2024-01-01 ENCOUNTER — Ambulatory Visit (HOSPITAL_COMMUNITY)
Admission: RE | Admit: 2024-01-01 | Discharge: 2024-01-01 | Disposition: A | Discharge status: Home / Self Care | Source: Ambulatory Visit | Attending: Neurosurgery | Admitting: Neurosurgery

## 2024-01-01 DIAGNOSIS — M4316 Spondylolisthesis, lumbar region: Secondary | ICD-10-CM | POA: Diagnosis not present

## 2024-01-01 DIAGNOSIS — M48062 Spinal stenosis, lumbar region with neurogenic claudication: Secondary | ICD-10-CM | POA: Diagnosis present

## 2024-01-01 DIAGNOSIS — M51369 Other intervertebral disc degeneration, lumbar region without mention of lumbar back pain or lower extremity pain: Secondary | ICD-10-CM

## 2024-01-01 DIAGNOSIS — M48061 Spinal stenosis, lumbar region without neurogenic claudication: Secondary | ICD-10-CM | POA: Diagnosis not present

## 2024-01-01 DIAGNOSIS — M4854XA Collapsed vertebra, not elsewhere classified, thoracic region, initial encounter for fracture: Secondary | ICD-10-CM

## 2024-01-01 DIAGNOSIS — M47896 Other spondylosis, lumbar region: Secondary | ICD-10-CM

## 2024-01-01 NOTE — Telephone Encounter (Signed)
Fyi noted.

## 2024-01-01 NOTE — Telephone Encounter (Signed)
 FYI Only or Action Required?: FYI only for provider.  Patient was last seen in primary care on 12/22/2023 by Joesph Annabella HERO, FNP.  Called Nurse Triage reporting Fall.  Symptoms began today.  Interventions attempted: Nothing.  Symptoms are: unchanged.  Triage Disposition: Go to ED Now (Notify PCP)  Patient/caregiver understands and will follow disposition?: No, refuses disposition   Copied from CRM [id number]. Topic: Clinical - Red Word Triage >> Jan 01, 2024  9:00 AM [name] wrote: Red Word that prompted transfer to Nurse Triage: Clemens hit head, back and right hip, pulled muscle. Can't hardly walk. Reason for Disposition  Injury (or injuries) that need emergency care  Answer Assessment - Initial Assessment Questions Pt states he fell approx 20 minutes ago out of bed when he was going to answer the telephone. States he hit his head on something and the L side of his body. Admits to L hip pain and states he has a pulled muscle on the R side. Walks with a cane. RN recommended ED visit for injuries, pt refused, RN asked if pt was compliant with a UC visit, pt denied. Pt stated he probably would not be going to his injection appointment or MRI today either. RN attempted education on importance of prompt evaluation, pt continues to denies medical treatment, care. [facility] contacted and made aware of ED refusal.   1. MECHANISM: How did the fall happen?     He was getting out of bed and fell to floor.  2. DOMESTIC VIOLENCE AND ELDER ABUSE SCREENING: Did you fall because someone pushed you or tried to hurt you? If Yes, ask: Are you safe now?     denies 3. ONSET: When did the fall happen? (e.g., minutes, hours, or days ago)     This morning 4. LOCATION: What part of the body hit the ground? (e.g., back, buttocks, head, hips, knees, hands, head, stomach)     Head, L side, L hip, R hip has a pulled muscle 5. INJURY: Did you hurt (injure) yourself when you fell? If Yes, ask: What did  you injure? Tell me more about this? (e.g., body area; type of injury; pain severity)     Denies 6. PAIN: Is there any pain? If Yes, ask: How bad is the pain? (e.g., Scale 0-10; or none, mild,      7-8/10 7. SIZE: For cuts, bruises, or swelling, ask: How large is it? (e.g., inches or centimeters)      denies 9. OTHER SYMPTOMS: Do you have any other symptoms? (e.g., dizziness, fever, weakness; new-onset or worsening).      Denies 10. CAUSE: What do you think caused the fall (or falling)? (e.g., dizzy spell, tripped)       Mechanical  Protocols used: Falls and [facility]

## 2024-01-03 ENCOUNTER — Encounter: Payer: Self-pay | Admitting: Family Medicine

## 2024-01-03 ENCOUNTER — Ambulatory Visit: Payer: Self-pay

## 2024-01-03 ENCOUNTER — Ambulatory Visit (INDEPENDENT_AMBULATORY_CARE_PROVIDER_SITE_OTHER): Admitting: Family Medicine

## 2024-01-03 VITALS — BP 150/62 | HR 65 | Temp 97.8°F | Ht 76.0 in | Wt 171.4 lb

## 2024-01-03 DIAGNOSIS — Z23 Encounter for immunization: Secondary | ICD-10-CM | POA: Diagnosis not present

## 2024-01-03 DIAGNOSIS — M48061 Spinal stenosis, lumbar region without neurogenic claudication: Secondary | ICD-10-CM | POA: Diagnosis not present

## 2024-01-03 DIAGNOSIS — F259 Schizoaffective disorder, unspecified: Secondary | ICD-10-CM | POA: Diagnosis not present

## 2024-01-03 MED ORDER — BETAMETHASONE SOD PHOS & ACET 6 (3-3) MG/ML IJ SUSP
6.0000 mg | Freq: Once | INTRAMUSCULAR | Status: AC
  Administered 2024-01-03: 6 mg via INTRAMUSCULAR

## 2024-01-03 NOTE — Telephone Encounter (Addendum)
 FYI Only or Action Required?: FYI only for provider. Advised to reach out to Neurosurgery as well  Patient was last seen in primary care on 12/22/2023 by Joesph Annabella HERO, FNP.  Called Nurse Triage reporting Back Pain.  Symptoms began several months ago.  Interventions attempted: OTC medications: Tylenol .  Symptoms are: gradually worsening.  Triage Disposition: See HCP Within 4 Hours (Or PCP Triage)  Patient/caregiver understands and will follow disposition?: Yes   Copied from CRM #[id number]. Topic: Clinical - Red Word Triage >> Jan 03, 2024  9:33 AM Zane F wrote: [facility] that prompted transfer to Nurse Triage:   Concern: worsening back pain (due to a pinched nerve)  Symptoms:  Patient stays in bed to limit pain  When did the symptoms start?: for some time  What have you done to aid in the concern ? Have you taken anything to assist with the matter?: Yes   If so, what did you take?: tylenol  and it only stops pain for a moment  Wanted to let you know I will be transferring you to further discuss your concern. Please be advised the nurse can assist with scheduling. Reason for Disposition  [1] SEVERE back pain (e.g., excruciating, unable to do any normal activities) AND [2] not improved 2 hours after pain medicine  Answer Assessment - Initial Assessment Questions Back pain- pinched nerve- Dr sebastian will be doing surgery soon- MRI completed with results not relayed to patient. Increased pain with trying to get up from bed.  Started over a year ago.  Woke at 3am due to pain.  Lorazepam  QID   1. ONSET: When did the pain begin? (e.g., minutes, hours, days)    Ongoing but Worsened due to fall 2 days ago 2. LOCATION: Where does it hurt? (upper, mid or lower back)     Middle of his back, his whole body hurts 3. SEVERITY: How bad is the pain?  (e.g., Scale 1-10; mild, moderate, or severe)     10/10+ 4. PATTERN: Is the pain constant? (e.g., yes, no; constant,  intermittent)      Constant- hurts more when laying still 5. RADIATION: Does the pain shoot into your legs or somewhere else?     Legs and whole body  6. CAUSE:  What do you think is causing the back pain?      Fall 7. BACK OVERUSE:  Any recent lifting of heavy objects, strenuous work or exercise?     Pinched nerve at baseline- fell out of bed 2 days ago 8. MEDICINES: What have you taken so far for the pain? (e.g., nothing, acetaminophen , NSAIDS)     Tylenol  9. NEUROLOGIC SYMPTOMS: Do you have any weakness, numbness, or problems with bowel/bladder control?     Numbness down his legs at baseline  10. OTHER SYMPTOMS: Do you have any other symptoms? (e.g., fever, abdomen pain, burning with urination, blood in urine)       denies  Protocols used: Back Pain-A-AH

## 2024-01-03 NOTE — Progress Notes (Signed)
 Subjective:  Patient ID: Matthew [name], male    DOB: 1951-02-14  Age: 73 y.o. MRN: [id number]  CC: discuss surgery    HPI  Discussed the use of AI scribe software for clinical note transcription with the patient, who gave verbal consent to proceed.  History of Present Illness Matthew [name] is a 73 year old male who presents with worsening back and abdominal pain.  He experiences deep, internal pain primarily in his back, located around the lower left ribs and spine, extending into the central part of the buttocks. The pain is most severe in the middle of the back and radiates to both sides and across the abdomen. It worsens when lying down and attempting to get up, and has significantly worsened over time from mild to severe, making it unbearable. Movement provides slight relief, but the pain remains a deep soreness.  He denies nausea, vomiting, diarrhea, or specific abdominal pain aside from the described soreness. An MRI has been performed, but details of the results were not discussed with him.  He is currently taking Tylenol , which is insufficient for his pain management.  Treated for schizoaffective disorder which causes some speech slurring         01/03/2024    1:51 PM 12/22/2023   10:35 AM 11/06/2023   11:29 AM  Depression screen PHQ 2/9  Decreased Interest 0 0 0  Down, Depressed, Hopeless 0 0 0  PHQ - 2 Score 0 0 0  Altered sleeping  0   Tired, decreased energy  0   Change in appetite  0   Feeling bad or failure about yourself   0   Trouble concentrating  0   Moving slowly or fidgety/restless  0   Suicidal thoughts  0   PHQ-9 Score  0   Difficult doing work/chores  Not difficult at all     History Matthew [name] has a past medical history of Agoraphobia, Anemia, Anxiety, Arthritis, Asthma, Barrett esophagus, Blood transfusion without reported diagnosis, Bronchitis, Chronic kidney disease, Complication of anesthesia, Educated about COVID-19 virus infection (12/02/2019),  Elevated serum creatinine, Esophageal stricture, GERD (gastroesophageal reflux disease), Hypertension, Mood disorder, Neck pain, chronic, Neuromuscular disorder (HCC), Palpitations (12/02/2019), Rosacea, and Vitamin B12 deficiency.   He has a past surgical history that includes Chest tube insertion; Fracture surgery; Wisdom tooth extraction; Lumbar laminectomy/decompression microdiscectomy (12/06/2011); Colonoscopy (08/20/2003); Upper gastrointestinal endoscopy (03/15/2013); and skin cancer removal from forehead.   His family history includes Diabetes in his mother; Heart disease in his mother; Stroke in his mother.He reports that he quit smoking about 38 years ago. His smoking use included cigarettes. He started smoking about 41 years ago. He has a 0.9 pack-year smoking history. He has never used smokeless tobacco. He reports that he does not drink alcohol and does not use drugs.    ROS Review of Systems  Objective:  BP (!) 150/62   Pulse 65   Temp 97.8 F (36.6 C)   Ht 6' 4 (1.93 m)   Wt 171 lb 6.4 oz (77.7 kg)   SpO2 99%   BMI 20.86 kg/m   BP Readings from Last 3 Encounters:  01/03/24 (!) 150/62  12/22/23 136/71  11/06/23 131/63    Wt Readings from Last 3 Encounters:  01/03/24 171 lb 6.4 oz (77.7 kg)  12/22/23 177 lb 9.6 oz (80.6 kg)  11/06/23 173 lb (78.5 kg)     Physical Exam Physical Exam GENERAL: Alert, cooperative, well developed, no acute distress HEENT: Normocephalic, normal  oropharynx, moist mucous membranes CHEST: Clear to auscultation bilaterally, No wheezes, rhonchi, or crackles CARDIOVASCULAR: Normal heart rate and rhythm, S1 and S2 normal without murmurs ABDOMEN: Soft, non-tender, non-distended, without organomegaly, Normal bowel sounds EXTREMITIES: No cyanosis or edema NEUROLOGICAL: Cranial nerves grossly intact, Moves all extremities without gross motor or sensory deficit   Assessment & Plan:  Spinal stenosis of lumbar region, unspecified whether  neurogenic claudication present -     Betamethasone  Sod Phos & Acet  Encounter for immunization -     Flu vaccine HIGH DOSE PF(Fluzone Trivalent)  Schizoaffective disorder, unspecified type (HCC)    Assessment and Plan Assessment & Plan 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 The pain is deep, sore, and migratory, affecting the mid-back, buttocks, and abdomen. There are no symptoms of nausea, vomiting, or diarrhea. Surgery was previously advised against unless pain increased, which it has. Current pain management is limited by regulatory restrictions on medications. Administer a pain relief injection. Encourage follow-up with the surgeon regarding potential surgery and advise persistent contact with the surgeon's office for further management and clarification on surgical plans.       Follow-up: No follow-ups on file.  Butler Der, M.D.

## 2024-01-03 NOTE — Telephone Encounter (Signed)
Patient being seen today for this issue

## 2024-01-08 ENCOUNTER — Telehealth: Payer: Self-pay | Admitting: Family Medicine

## 2024-01-08 NOTE — Telephone Encounter (Signed)
 Copied from CRM [id number]. Topic: Clinical - Red Word Triage >> Jan 08, 2024  9:37 AM Susanna ORN wrote: Red Word that prompted transfer to Nurse Triage: Patient states he has a pinched nerve in his back but now the pain is all in his stomach & chest. Patient says the pain is terrible. Offered patient to speak with nurse triage and he declined. States he's spoken with the nurse before on this issue and also states Dr. Zollie only gives him cortisone shots but they doesn't help. Patient states they will not prescribe him pain pills so he just have to deal with the pain.

## 2024-01-08 NOTE — Telephone Encounter (Signed)
 We can try lyrica if he prefers. He would have to do follow up here.

## 2024-01-11 ENCOUNTER — Ambulatory Visit

## 2024-01-12 ENCOUNTER — Ambulatory Visit: Payer: Self-pay

## 2024-01-12 NOTE — Telephone Encounter (Signed)
 Noted

## 2024-01-12 NOTE — Telephone Encounter (Signed)
 FYI Only or Action Required?: FYI only for provider.  Patient was last seen in primary care on 01/03/2024 by Zollie Lowers, MD.  Called Nurse Triage reporting Heartburn.  Symptoms began yesterday.  Interventions attempted: OTC medications: Nexium , Pepcid .  Symptoms are: rapidly improving.  Triage Disposition: Call PCP Within 24 Hours  Patient/caregiver understands and will follow disposition?: Unsure  **Patient declined an appointment at this time; see note below**     Copied from CRM #[id number]. Topic: Clinical - Red Word Triage >> Jan 12, 2024  3:46 PM Matthew [name] wrote: Red Word that prompted transfer to Nurse Triage: chronic heart burn Reason for Disposition  [1] Patient says chest pain feels exactly the same as previously diagnosed heartburn AND [2] describes burning in chest AND [3] accompanying sour taste in mouth  Answer Assessment - Initial Assessment Questions 1. LOCATION: Where does it hurt?       Center of breast area, esophagus   2. RADIATION: Does the pain go anywhere else? (e.g., into neck, jaw, arms, back)     No   3. ONSET: When did the chest pain begin? (Minutes, hours or days)      Last night, reports increased heartburn   4. PATTERN: Does the pain come and go, or has it been constant since it started?  Does it get worse with exertion?      Intermittent   5. DURATION: How long does it last (e.g., seconds, minutes, hours)     Hours   6. SEVERITY: How bad is the pain?  (e.g., Scale 1-10; mild, moderate, or severe)     Mild, currently   7. CARDIAC RISK FACTORS: Do you have any history of heart problems or risk factors for heart disease? (e.g., angina, prior heart attack; diabetes, high blood pressure, high cholesterol, smoker, or strong family history of heart disease)     No  8. PULMONARY RISK FACTORS: Do you have any history of lung disease?  (e.g., blood clots in lung, asthma, emphysema, birth control pills)     No   9. CAUSE:  What do you think is causing the chest pain?     chronic GERD suspected  10. OTHER SYMPTOMS: Do you have any other symptoms? (e.g., dizziness, nausea, vomiting, sweating, fever, difficulty breathing, cough)  No  Patient is taking Nexium , and Pepcid  for chronic GERD. Pt. Declined an appointment at this time, because he drank some water and symptoms resolved. Home care advice given.  Protocols used: Chest Pain-A-AH

## 2024-01-12 NOTE — Telephone Encounter (Signed)
 Patient reports was calling back to only inform the medication he is taking for heartburn. Patient states he told nurse he would call back when he finds it, Famotidine  20 mg.  Reports drunk water and no longer having heartburn.  Patient denies any chest pain, sob, n/v/d.  Advised ED/911 if symptoms occur.

## 2024-01-16 ENCOUNTER — Encounter: Payer: Self-pay | Admitting: Family Medicine

## 2024-01-16 ENCOUNTER — Ambulatory Visit: Admitting: Family Medicine

## 2024-01-16 ENCOUNTER — Ambulatory Visit (INDEPENDENT_AMBULATORY_CARE_PROVIDER_SITE_OTHER): Admitting: Family Medicine

## 2024-01-16 ENCOUNTER — Telehealth: Payer: Self-pay | Admitting: Adult Health

## 2024-01-16 VITALS — BP 157/61 | HR 60 | Temp 98.2°F | Ht 76.0 in | Wt 174.2 lb

## 2024-01-16 DIAGNOSIS — I10 Essential (primary) hypertension: Secondary | ICD-10-CM

## 2024-01-16 DIAGNOSIS — K2100 Gastro-esophageal reflux disease with esophagitis, without bleeding: Secondary | ICD-10-CM

## 2024-01-16 DIAGNOSIS — K2270 Barrett's esophagus without dysplasia: Secondary | ICD-10-CM | POA: Diagnosis not present

## 2024-01-16 DIAGNOSIS — N1832 Chronic kidney disease, stage 3b: Secondary | ICD-10-CM

## 2024-01-16 MED ORDER — ESOMEPRAZOLE MAGNESIUM 40 MG PO CPDR
40.0000 mg | DELAYED_RELEASE_CAPSULE | Freq: Two times a day (BID) | ORAL | 1 refills | Status: AC
Start: 2024-01-16 — End: ?

## 2024-01-16 NOTE — Progress Notes (Signed)
 Subjective:  Patient ID: Matthew [name], male    DOB: [birth year]  Age: 73 y.o. MRN: [id number]  CC: esophagus concerns (Hx of hiatal hernia)   HPI  Discussed the use of AI scribe software for clinical note transcription with the patient, who gave verbal consent to proceed.  History of Present Illness Matthew [name] is a 73 year old male with a hiatal hernia who presents with swallowing difficulties and heartburn.  He has been experiencing intermittent swallowing difficulties and heartburn, describing swallowing as 'a little iffy' and food 'doesn't go down as good as a spoon.' A recent episode occurred yesterday, but he has no current issues with swallowing today. He notes that certain foods like mashed potatoes and pinto beans are easier to swallow.  He has a longstanding history of a hiatal hernia for over forty years, which has not caused significant issues until recently. He takes Tylenol  but is unsure if it contributes to his symptoms. He also takes Nexium  once a day but recalls previously taking it twice a day under the care of a different doctor.  He expresses difficulty in arranging transportation to see a specialist due to family members' availability and his inability to drive out of town. His nephew, who used to assist with transportation, now has a job that limits his availability.  In terms of past medical history, he had blood work done in September which showed normal vitamin B12 levels. In August, his cholesterol was normal, and his kidney function was noted to be at about forty percent of normal. He also had mild anemia noted in August.   presents for  follow-up of hypertension. Patient has no history of headache chest pain or shortness of breath or recent cough. Patient also denies symptoms of TIA such as focal numbness or weakness. Patient denies side effects from medication. States taking it regularly.          01/16/2024    4:03 PM 01/03/2024    1:51 PM 12/22/2023    10:35 AM  Depression screen PHQ 2/9  Decreased Interest 0 0 0  Down, Depressed, Hopeless 0 0 0  PHQ - 2 Score 0 0 0  Altered sleeping 1  0  Tired, decreased energy 0  0  Change in appetite 0  0  Feeling bad or failure about yourself  0  0  Trouble concentrating 0  0  Moving slowly or fidgety/restless 0  0  Suicidal thoughts 0  0  PHQ-9 Score 1  0  Difficult doing work/chores Not difficult at all  Not difficult at all    History Matthew [name] has a past medical history of Agoraphobia, Anemia, Anxiety, Arthritis, Asthma, Barrett esophagus, Blood transfusion without reported diagnosis, Bronchitis, Chronic kidney disease, Complication of anesthesia, Educated about COVID-19 virus infection (12/02/2019), Elevated serum creatinine, Esophageal stricture, GERD (gastroesophageal reflux disease), Hypertension, Mood disorder, Neck pain, chronic, Neuromuscular disorder (HCC), Palpitations (12/02/2019), Rosacea, and Vitamin B12 deficiency.   He has a past surgical history that includes Chest tube insertion; Fracture surgery; Wisdom tooth extraction; Lumbar laminectomy/decompression microdiscectomy (12/06/2011); Colonoscopy (08/20/2003); Upper gastrointestinal endoscopy (03/15/2013); and skin cancer removal from forehead.   His family history includes Diabetes in his mother; Heart disease in his mother; Stroke in his mother.He reports that he quit smoking about 38 years ago. His smoking use included cigarettes. He started smoking about 41 years ago. He has a 0.9 pack-year smoking history. He has never used smokeless tobacco. He reports that he does not drink alcohol  and does not use drugs.    ROS Review of Systems  Constitutional: Negative.   HENT: Negative.    Eyes:  Negative for visual disturbance.  Respiratory:  Negative for cough and shortness of breath.   Cardiovascular:  Negative for chest pain and leg swelling.  Gastrointestinal:  Negative for abdominal pain, diarrhea, nausea and vomiting.   Genitourinary:  Negative for difficulty urinating.  Musculoskeletal:  Negative for myalgias.  Skin:  Negative for rash.  Neurological:  Negative for headaches.  Psychiatric/Behavioral:  Negative for sleep disturbance.     Objective:  BP (!) 157/61   Pulse 60   Temp 98.2 F (36.8 C)   Ht 6' 4 (1.93 m)   Wt 174 lb 3.2 oz (79 kg)   SpO2 100%   BMI 21.20 kg/m   BP Readings from Last 3 Encounters:  01/16/24 (!) 157/61  01/03/24 (!) 150/62  12/22/23 136/71    Wt Readings from Last 3 Encounters:  01/16/24 174 lb 3.2 oz (79 kg)  01/03/24 171 lb 6.4 oz (77.7 kg)  12/22/23 177 lb 9.6 oz (80.6 kg)     Physical Exam Vitals reviewed.  Constitutional:      Appearance: He is well-developed.  HENT:     Head: Normocephalic and atraumatic.     Right Ear: External ear normal.     Left Ear: External ear normal.     Mouth/Throat:     Pharynx: No oropharyngeal exudate or posterior oropharyngeal erythema.  Eyes:     Pupils: Pupils are equal, round, and reactive to light.  Cardiovascular:     Rate and Rhythm: Normal rate and regular rhythm.     Heart sounds: No murmur heard. Pulmonary:     Effort: No respiratory distress.     Breath sounds: Normal breath sounds.  Musculoskeletal:     Cervical back: Normal range of motion and neck supple.  Neurological:     Mental Status: He is alert and oriented to person, place, and time.    Physical Exam GENERAL: Alert, cooperative, well developed, no acute distress. HEENT: Normocephalic, normal oropharynx, moist mucous membranes. NECK: Supple, no tenderness. CHEST: Clear to auscultation bilaterally, no wheezes, rhonchi, or crackles. CARDIOVASCULAR: Normal heart rate and rhythm, S1 and S2 normal without murmurs. ABDOMEN: Soft, non-tender, non-distended, without organomegaly, normal bowel sounds. EXTREMITIES: No cyanosis or edema. NEUROLOGICAL: Cranial nerves grossly intact, moves all extremities without gross motor or sensory  deficit.   Assessment & Plan:  Stage 3b chronic kidney disease (HCC)  Gastroesophageal reflux disease with esophagitis without hemorrhage -     Esomeprazole  Magnesium ; Take 1 capsule (40 mg total) by mouth 2 (two) times daily. On an empty stomach  Dispense: 180 capsule; Refill: 1  Barrett's esophagus without dysplasia  Essential hypertension    Assessment and Plan Assessment & Plan Hiatal hernia with gastroesophageal reflux symptoms   Chronic hiatal hernia with recent worsening of gastroesophageal reflux symptoms, including heartburn and dysphagia. Symptoms have persisted for over 40 years. Current management with Nexium  once daily is insufficient. Increase Nexium  to twice daily on an empty stomach, in the morning and evening, ensuring no food intake for two hours before and one hour after administration. Discuss transportation options for referral to a gastroenterologist if symptoms worsen.  Chronic kidney disease, stage 3   Chronic kidney disease stage 3 with kidney function at approximately 40% of normal.       Follow-up: No follow-ups on file.  Butler Der, M.D.

## 2024-01-16 NOTE — Telephone Encounter (Signed)
 Received after hours call from [facility] 1950/06/21. Patient reporting concerns regarding medication administration. Patient reports he is uncertain if he has taken his evening medications for the past 2 nights - but has taken 3 of the 4 doses. With his uncertainty, and having taken 3 of the 4 doses with certainty, I advised that he not take an an additional dose and to work on setting up medications in a manner that he can be certain he has taken all 4 doses - pill container, bubble pack, etc. Advised patient to follow up with nursing staff today.

## 2024-01-16 NOTE — Telephone Encounter (Signed)
 I agree with recommendations to use pillbox.

## 2024-01-16 NOTE — Telephone Encounter (Signed)
 Called to FU with patient. He said he got confused and wasn't sure if he took his medicine or not and was worried about overdosing. He has had similar issues before. He does have a pillbox. He said he took his medication correctly today. No issues. His aunt is with him.

## 2024-01-18 ENCOUNTER — Ambulatory Visit (INDEPENDENT_AMBULATORY_CARE_PROVIDER_SITE_OTHER): Admitting: *Deleted

## 2024-01-18 DIAGNOSIS — E538 Deficiency of other specified B group vitamins: Secondary | ICD-10-CM | POA: Diagnosis not present

## 2024-01-18 NOTE — Progress Notes (Signed)
 Patient is in office today for a nurse visit for B12 Injection.  Injection was given in the  Right deltoid. Patient tolerated injection well.

## 2024-01-18 NOTE — Telephone Encounter (Signed)
 Apt today. LS

## 2024-01-19 ENCOUNTER — Telehealth: Payer: Self-pay

## 2024-01-19 NOTE — Telephone Encounter (Signed)
 Copied from CRM [id number]. Topic: Clinical - Medication Question >> Jan 19, 2024  1:35 PM Rexford HERO wrote: Reason for CRM: Patient wants to know how often he can get cortisone shots for pinched nerve in back. Please advise.  Call back # [phone] or [phone]

## 2024-01-22 ENCOUNTER — Telehealth: Payer: Self-pay

## 2024-01-22 NOTE — Telephone Encounter (Signed)
 Copied from CRM [id number]. Topic: Clinical - Request for Lab/Test Order >> Jan 22, 2024  3:11 PM Ivette P wrote: Reason for CRM: pt called in because has order from [employer] to get blood work. Kinsey assosciates  Pt does not want to go to [place] for blood work. Asking if he can get blood work done. Says he does not have an order.   Pls follow up with pt

## 2024-01-22 NOTE — Telephone Encounter (Signed)
 Spoke to pt and told him that we would need orders before labs can be drawn. Pt is going to call and find out what is needed. LS

## 2024-01-24 NOTE — Telephone Encounter (Signed)
 Contacted patient. Notified patient. Patient verbalized understanding.

## 2024-01-24 NOTE — Telephone Encounter (Signed)
Every 6 weeks

## 2024-01-25 NOTE — Telephone Encounter (Unsigned)
 Copied from CRM #[id number]. Topic: Clinical - Medical Advice >> Jan 24, 2024  4:48 PM Chiquita SQUIBB wrote: Reason for CRM: Patient is calling in asking when he can get his next cortisone shot, if it was 6 weeks or farther out. Please advise the patient.

## 2024-01-25 NOTE — Telephone Encounter (Signed)
 documEnted in a separate encounter. LS

## 2024-01-31 ENCOUNTER — Telehealth: Payer: Self-pay

## 2024-01-31 NOTE — Telephone Encounter (Signed)
Pt notified.    LS

## 2024-01-31 NOTE — Telephone Encounter (Signed)
 Copied from CRM [id number]. Topic: Clinical - Medication Question >> Jan 31, 2024  9:12 AM Wess RAMAN wrote: Reason for CRM: Patient stated he cant take 2 esomeprazole  (NEXIUM ) 40 MG capsule because it dries him out. He would like to take only 1 and to get back on the famotidine  (PEPCID ) 20 MG tablet  He would also like a call from Dr. Zollie or the nurse to discuss his medications  Callback #: [phone] or [phone]  Pharmacy:  [facility] [place], [place] - [address] [place] [address] Phone: [phone] Fax: [phone] Hours: Not open 24 hours >> Jan 31, 2024  3:29 PM Emylou G wrote: Please call patient back.. missed your call about the prescription issue.  He has concerns.. cellphone is still accurate

## 2024-01-31 NOTE — Telephone Encounter (Signed)
 That sounds fine - one of each daily

## 2024-01-31 NOTE — Telephone Encounter (Signed)
 Patient is returning call regarding his medication concerns. CAL stated the nurse is on a call and will return patient's call when done.

## 2024-01-31 NOTE — Telephone Encounter (Signed)
 duplicate

## 2024-01-31 NOTE — Telephone Encounter (Signed)
 Copied from CRM [id number]. Topic: Clinical - Medication Question >> Jan 31, 2024  4:09 PM Alfonso HERO wrote: Patient call calling for status of previous call. Asking for a callback.

## 2024-01-31 NOTE — Telephone Encounter (Unsigned)
 Copied from CRM [id number]. Topic: Clinical - Medication Question >> Jan 31, 2024  9:12 AM Wess RAMAN wrote: Reason for CRM: Patient stated he cant take 2 esomeprazole  (NEXIUM ) 40 MG capsule because it dries him out. He would like to take only 1 and to get back on the famotidine  (PEPCID ) 20 MG tablet  He would also like a call from Dr. Zollie or the nurse to discuss his medications  Callback #: [phone] or [phone]  Pharmacy:  [facility] [place], [place] - [address] [place] [address] Phone: [phone] Fax: [phone] Hours: Not open 24 hours

## 2024-02-05 ENCOUNTER — Ambulatory Visit: Payer: Self-pay

## 2024-02-05 NOTE — Telephone Encounter (Signed)
 Copied from CRM [id number]. Topic: Clinical - Medical Advice >> Feb 05, 2024  8:42 AM [name] wrote: Reason for CRM: Patient called. States he had the need to go to bathroom twice but not much is coming out. Concerned about kidneys. No blood or burning. Urinated in cup at home would like to know if he can bring that in. Unsure if he'll be able to go again. Thank You

## 2024-02-05 NOTE — Telephone Encounter (Signed)
 Copied from CRM [id number]. Topic: Clinical - Red Word Triage >> Feb 05, 2024  2:50 PM Matthew [name] wrote: Red Word that prompted transfer to Nurse Triage: patient has a pinched nerve and it hurts to walk. Difficulty urinating. Back pain / Abdominal .. He spoke to NT this morning and refused to see a dr and now he wants to see his provider. This encounter was created in error - please disregard.

## 2024-02-05 NOTE — Telephone Encounter (Signed)
 Patient called back in after speaking with NT this morning. Patient changed his mind and wished to schedule an appointment with his PCP. Patient denied new or worsening symptoms since he spoke with NT this morning. This RN scheduled OV with PCP for 02/07/24.  Copied from CRM [id number]. Topic: Clinical - Red Word Triage >> Feb 05, 2024  2:50 PM Matthew [name] wrote: Red Word that prompted transfer to Nurse Triage: patient has a pinched nerve and it hurts to walk. Difficulty urinating. Back pain / Abdominal .. He spoke to NT this morning and refused to see a dr and now he wants to see his provider.

## 2024-02-05 NOTE — Telephone Encounter (Signed)
 Patient's cell [phone] Patient wants to be contacted at this cell phone number  FYI Only or Action Required?: Action required by provider: clinical question for provider and update on patient condition.  Patient was last seen in primary care on 01/16/2024 by Zollie Lowers, MD.  Called Nurse Triage reporting Urinary Retention.  Symptoms began yesterday.  Interventions attempted: Rest, hydration, or home remedies.  Symptoms are: patient states better.  Triage Disposition: See PCP Within 2 Weeks  Patient/caregiver understands and will follow disposition?: No, wishes to speak with PCP                    Copied from CRM #[id number]. Topic: Clinical - Red Word Triage >> Feb 05, 2024 10:57 AM Antwanette L wrote: Red Word that prompted transfer to Nurse Triage: The patient is calling back due to difficulty urinating, which he attributes to chronic kidney disease. He reports that he was able to urinate a small amount. Additionally, the patient is experiencing abdominal/ back pain and mentions having a pinched nerve. Reason for Disposition  All other urine symptoms  Answer Assessment - Initial Assessment Questions Patient called initially due to not being able to urinate much---patient states after drinking a lot of water he urinated well afterwards this morning Patient was advised that he couldn't bring in a urine sample because it wouldn't be in a sterile container and he was advised to make an appointment  provide a urine sample. Patient states that his symptoms improved because he was able to urinate more this morning and he states he didn't need to do anything but go to the lab and give a urine sample. Patient states that if he has to stop urinating, it is hard to continue back urinating. Patient did not want to come in for an appointment He states this has happened before and he just comes in to the lab and provides a sample This RN offered an appointment for this  afternoon but patient declined and stated that he just want to come to the lab to provide urine if Dr Zollie wanted that and to call him back on his cell phone [phone] Patient is advised to call us  back if anything changes or with any further questions/concerns. Patient is advised that if anything worsens to go to the Emergency Room. Patient verbalized understanding.    1. SYMPTOM: What's the main symptom you're concerned about? (e.g., frequency, incontinence)     Last night and this morning he had trouble urinating 2. ONSET: When did the  urination symptoms  start?     Last night 3. PAIN: Is there any pain? If Yes, ask: How bad is it? (Scale: 1-10; mild, moderate, severe)     Patient states he also has back pain 4. CAUSE: What do you think is causing the symptoms?     ------- 5. OTHER SYMPTOMS: Do you have any other symptoms? (e.g., blood in urine, fever, flank pain, pain with urination)     Back pain  Protocols used: Urinary Symptoms-A-AH

## 2024-02-05 NOTE — Telephone Encounter (Signed)
 See Triage Notes from today

## 2024-02-05 NOTE — Telephone Encounter (Signed)
 Tried to call to see if pt wanted to come in tomorrow instead on 11/12. Phone rang but never able to lm on vm. LS

## 2024-02-06 NOTE — Telephone Encounter (Signed)
 Appointment tomorrow. Never heard back. LS

## 2024-02-07 ENCOUNTER — Encounter: Payer: Self-pay | Admitting: Family Medicine

## 2024-02-07 ENCOUNTER — Ambulatory Visit (INDEPENDENT_AMBULATORY_CARE_PROVIDER_SITE_OTHER): Admitting: Family Medicine

## 2024-02-07 VITALS — BP 142/72 | HR 67 | Temp 97.7°F | Ht 76.0 in | Wt 165.0 lb

## 2024-02-07 DIAGNOSIS — R3911 Hesitancy of micturition: Secondary | ICD-10-CM | POA: Diagnosis not present

## 2024-02-07 DIAGNOSIS — M549 Dorsalgia, unspecified: Secondary | ICD-10-CM | POA: Diagnosis not present

## 2024-02-07 DIAGNOSIS — N401 Enlarged prostate with lower urinary tract symptoms: Secondary | ICD-10-CM | POA: Diagnosis not present

## 2024-02-07 DIAGNOSIS — R351 Nocturia: Secondary | ICD-10-CM | POA: Diagnosis not present

## 2024-02-07 MED ORDER — PREGABALIN 50 MG PO CAPS: ORAL_CAPSULE | ORAL | 0 refills | Status: DC

## 2024-02-07 MED ORDER — TAMSULOSIN HCL 0.4 MG PO CAPS: 0.8000 mg | ORAL_CAPSULE | Freq: Every day | ORAL | 3 refills | Status: AC

## 2024-02-07 NOTE — Progress Notes (Signed)
 Subjective:  Patient ID: Matthew [name], male    DOB: [birth year]  Age: 73 y.o. MRN: [id number]  CC: urine retention (Ongoing issue with urinating. Following up with nephrology Friday.), pinched nerve (Only relieve is when laying down. Midline is most painful. Tylenol  not helping. ), and Stress (No one to take him to appointments some times. Reports he has been really stressed. Family has all passed.)   HPI  Discussed the use of AI scribe software for clinical note transcription with the patient, who gave verbal consent to proceed.  History of Present Illness Matthew [name] is a 73 year old male who presents with urinary retention and back pain.  He experiences urinary retention with episodes of incomplete urination, particularly when stopping midstream. This occurs occasionally and is inconsistent. He finds it difficult to urinate while sitting on the commode and uses a urinal to avoid exacerbating back pain when getting out of bed.  He has significant back pain. A surgeon evaluated him about a year ago and did not recommend surgery. He has a history of a compression fracture, arthritis with severe stenosis, degenerative disc disease, and ankylosis. He had an MRI about a month ago. He uses a cane for mobility and notes a decline in his ability to walk as he used to.  He takes multiple medications, including Thorazine  100 mg four times a day, Adzenys four times a day, Artane  twice a day, and Tegretol  four times a day, totaling about twenty pills a day.  He frequents local fast-food restaurants like [employer] and [employer], where he knows many people, and he avoids bars. He uses a cane for mobility.       02/07/2024    2:39 PM 01/16/2024    4:04 PM 01/03/2024    1:51 PM 12/22/2023   10:36 AM  GAD 7 : Generalized Anxiety Score  Nervous, Anxious, on Edge 2 0 0 0  Control/stop worrying 0 0 0 0  Worry too much - different things 0 0 0 0  Trouble relaxing 0 0 0 0  Restless 0 0 0 0  Easily  annoyed or irritable 0 0 0 0  Afraid - awful might happen 0 0 0 0  Total GAD 7 Score 2 0 0 0  Anxiety Difficulty  Not difficult at all Not difficult at all Not difficult at all          02/07/2024    2:39 PM 01/16/2024    4:03 PM 01/03/2024    1:51 PM  Depression screen PHQ 2/9  Decreased Interest 0 0 0  Down, Depressed, Hopeless 0 0 0  PHQ - 2 Score 0 0 0  Altered sleeping 0 1   Tired, decreased energy 0 0   Change in appetite 0 0   Feeling bad or failure about yourself  0 0   Trouble concentrating 0 0   Moving slowly or fidgety/restless 0 0   Suicidal thoughts 0 0   PHQ-9 Score 0 1    Difficult doing work/chores Not difficult at all Not difficult at all      Data saved with a previous flowsheet row definition    History Neftali has a past medical history of Agoraphobia, Anemia, Anxiety, Arthritis, Asthma, Barrett esophagus, Blood transfusion without reported diagnosis, Bronchitis, Chronic kidney disease, Complication of anesthesia, Educated about COVID-19 virus infection (12/02/2019), Elevated serum creatinine, Esophageal stricture, GERD (gastroesophageal reflux disease), Hypertension, Mood disorder, Neck pain, chronic, Neuromuscular disorder (HCC), Palpitations (12/02/2019), Rosacea, and Vitamin B12  deficiency.   He has a past surgical history that includes Chest tube insertion; Fracture surgery; Wisdom tooth extraction; Lumbar laminectomy/decompression microdiscectomy (12/06/2011); Colonoscopy (08/20/2003); Upper gastrointestinal endoscopy (03/15/2013); and skin cancer removal from forehead.   His family history includes Diabetes in his mother; Heart disease in his mother; Stroke in his mother.He reports that he quit smoking about 38 years ago. His smoking use included cigarettes. He started smoking about 41 years ago. He has a 0.9 pack-year smoking history. He has never used smokeless tobacco. He reports that he does not drink alcohol and does not use drugs.    ROS Review of  Systems  Constitutional: Negative.   HENT: Negative.    Eyes:  Negative for visual disturbance.  Respiratory:  Negative for cough and shortness of breath.   Cardiovascular:  Negative for chest pain and leg swelling.  Gastrointestinal:  Negative for abdominal pain, diarrhea, nausea and vomiting.  Genitourinary:  Negative for difficulty urinating.  Musculoskeletal:  Positive for arthralgias and back pain. Negative for myalgias.  Skin:  Negative for rash.  Neurological:  Negative for headaches.  Psychiatric/Behavioral:  Negative for sleep disturbance.     Objective:  BP (!) 142/72   Pulse 67   Temp 97.7 F (36.5 C)   Ht 6' 4 (1.93 m)   Wt 165 lb (74.8 kg)   SpO2 100%   BMI 20.08 kg/m   BP Readings from Last 3 Encounters:  02/07/24 (!) 142/72  01/16/24 (!) 157/61  01/03/24 (!) 150/62    Wt Readings from Last 3 Encounters:  02/07/24 165 lb (74.8 kg)  01/16/24 174 lb 3.2 oz (79 kg)  01/03/24 171 lb 6.4 oz (77.7 kg)     Physical Exam Physical Exam GENERAL: Alert, cooperative, well developed, no acute distress HEENT: Normocephalic, normal oropharynx, moist mucous membranes CHEST: Clear to auscultation bilaterally, No wheezes, rhonchi, or crackles CARDIOVASCULAR: Normal heart rate and rhythm, S1 and S2 normal without murmurs ABDOMEN: Soft, non-tender, non-distended, without organomegaly, Normal bowel sounds EXTREMITIES: No cyanosis or edema NEUROLOGICAL: Cranial nerves grossly intact, Moves all extremities without gross motor or sensory deficit   Assessment & Plan:  Benign prostatic hyperplasia with urinary hesitancy -     Urinalysis, Complete  Other orders -     Pregabalin; 1 qhs X7 days , then 2 qhs X 7d, then 3 qhs X 7d, then 4 qhs  Dispense: 120 capsule; Refill: 0 -     Tamsulosin HCl; Take 2 capsules (0.8 mg total) by mouth at bedtime. For urine flow and prostate  Dispense: 180 capsule; Refill: 3    Assessment and Plan Assessment & Plan Benign prostatic  hyperplasia with lower urinary tract symptoms   He experiences intermittent urinary retention and difficulty initiating urination, especially when using the commode. Symptoms are infrequent and not consistently present. No acute intervention is required at this time.  Compression fracture of lumbar vertebra with severe spinal stenosis, degenerative disc disease, and chronic back pain   Chronic back pain results from a compression fracture, severe spinal stenosis, and degenerative disc disease. Surgical intervention is not feasible due to high risk from his current medication regimen and overall health status. Pain management is ongoing, but complete resolution is unlikely. A recent MRI confirmed the compression fracture and severe stenosis.       Follow-up: Return in about 1 month (around 03/08/2024).  Butler Der, M.D.

## 2024-02-07 NOTE — Patient Instructions (Signed)
  VISIT SUMMARY: Today, we discussed your urinary retention and back pain. We reviewed your symptoms and current medications, and we discussed your recent MRI results.  YOUR PLAN: BENIGN PROSTATIC HYPERPLASIA WITH LOWER URINARY TRACT SYMPTOMS: You have an enlarged prostate causing intermittent urinary retention and difficulty starting urination. -No immediate intervention is needed at this time. Continue monitoring your symptoms and let us  know if they worsen.  COMPRESSION FRACTURE OF LUMBAR VERTEBRA WITH SEVERE SPINAL STENOSIS, DEGENERATIVE DISC DISEASE, AND CHRONIC BACK PAIN: Your chronic back pain is due to a compression fracture, severe spinal stenosis, and degenerative disc disease. Surgery is not recommended due to the risks associated with your current health and medications. -Continue with your current pain management plan. Complete resolution of pain is unlikely, but we aim to manage it as best as possible. -Your recent MRI confirmed the compression fracture and severe stenosis.                      Contains text generated by [employer]bridge.                                 Contains text generated by [employer]bridge.

## 2024-02-13 ENCOUNTER — Encounter: Payer: Self-pay | Admitting: Family Medicine

## 2024-02-19 ENCOUNTER — Telehealth: Payer: Self-pay | Admitting: *Deleted

## 2024-02-19 NOTE — Telephone Encounter (Signed)
Patient advised to stop medication.

## 2024-02-19 NOTE — Telephone Encounter (Signed)
Done. Thanks, WS 

## 2024-02-19 NOTE — Telephone Encounter (Signed)
 Patient contacted [facility] over the weekend stating that the gabapentin is making him feel awkward and nervous. And wants to know if he should stop taking.

## 2024-02-26 ENCOUNTER — Ambulatory Visit (INDEPENDENT_AMBULATORY_CARE_PROVIDER_SITE_OTHER): Payer: Self-pay | Admitting: *Deleted

## 2024-02-26 DIAGNOSIS — E538 Deficiency of other specified B group vitamins: Secondary | ICD-10-CM

## 2024-02-26 NOTE — Progress Notes (Signed)
 Patient is in office today for a nurse visit for B12 Injection. Injection was given in the  Left deltoid. Patient tolerated injection well.

## 2024-03-01 ENCOUNTER — Telehealth: Payer: Self-pay | Admitting: Family Medicine

## 2024-03-01 NOTE — Telephone Encounter (Unsigned)
 Copied from CRM [id number]. Topic: Appointments - Scheduling Inquiry for Clinic >> Mar 01, 2024  9:29 AM [name] wrote: Reason for CRM: Patient called. States he does not know if appt for 12/17 is needed. He has been going to the bathroom and is not taking the medication. Would like to know if provider needs to see him or ok to cancel. Thank You    ----------------------------------------------------------------------- From previous Reason for Contact - Appt Info/Confirm: Patient/patient representative is calling for information regarding an appointment.

## 2024-03-04 NOTE — Telephone Encounter (Signed)
 Okay to cancel

## 2024-03-05 ENCOUNTER — Ambulatory Visit: Payer: Self-pay

## 2024-03-05 ENCOUNTER — Other Ambulatory Visit: Payer: Self-pay | Admitting: Psychiatry

## 2024-03-05 DIAGNOSIS — F4001 Agoraphobia with panic disorder: Secondary | ICD-10-CM

## 2024-03-05 NOTE — Telephone Encounter (Signed)
 Appt made.

## 2024-03-05 NOTE — Telephone Encounter (Signed)
 Pt scheduled for soonest OV available.   FYI Only or Action Required?: FYI only for provider: appointment scheduled on 03/07/24.  Patient was last seen in primary care on 02/07/2024 by Zollie Lowers, MD.  Called Nurse Triage reporting Urinary Frequency.  Symptoms began Chronic.  Interventions attempted: Nothing.  Symptoms are: unchanged.  Triage Disposition: See Physician Within 24 Hours  Patient/caregiver understands and will follow disposition?: Yes  Reason for Disposition  Urinating more frequently than usual (i.e., frequency) OR new-onset of the feeling of an urgent need to urinate (i.e., urgency)  Answer Assessment - Initial Assessment Questions 1. SYMPTOM: What's the main symptom you're concerned about? (e.g., frequency, incontinence)     Pt reports slow to strong urinary stream. States that Dr. Zollie wanted to have him take a medication to slow his stream down because when he urinates he pees a whole lot. Last night, urinated 4 times. States is not taking tamsulosin .  Pt states he cannot drive out of town without someone driving him and has not seen urologist.   2. ONSET:      A long time  3. PAIN: Is there any pain? If Yes, ask: How bad is it? (Scale: 1-10; mild, moderate, severe)     Denies  4. CAUSE: What do you think is causing the symptoms?     Unknown  5. OTHER SYMPTOMS: Do you have any other symptoms? (e.g., blood in urine, fever, flank pain, pain with urination)     Denies, states hx of lower back pain that is historic  Protocols used: Urinary Symptoms-A-AH   Message from [place] E sent at 03/05/2024 11:23 AM EST  Summary: Frequent urination, wants to discuss   Reason for Triage: Pt urinates 3 times at night, wants to speak to a nurse. Strong stream each time  Declined offer for appt because he says he is busy during the holidays. Wants to speak to a nurse.  Let it ring more than three times because it takes me a while to get to the  phone  Best contact: [phone]

## 2024-03-05 NOTE — Telephone Encounter (Signed)
 Apt canceled and pt notified. LS

## 2024-03-07 ENCOUNTER — Encounter: Payer: Self-pay | Admitting: Family Medicine

## 2024-03-07 ENCOUNTER — Ambulatory Visit: Admitting: Family Medicine

## 2024-03-07 VITALS — BP 124/59 | HR 66 | Temp 97.5°F | Ht 76.0 in | Wt 165.0 lb

## 2024-03-07 DIAGNOSIS — I10 Essential (primary) hypertension: Secondary | ICD-10-CM

## 2024-03-07 DIAGNOSIS — F259 Schizoaffective disorder, unspecified: Secondary | ICD-10-CM

## 2024-03-07 DIAGNOSIS — N401 Enlarged prostate with lower urinary tract symptoms: Secondary | ICD-10-CM

## 2024-03-07 DIAGNOSIS — R350 Frequency of micturition: Secondary | ICD-10-CM

## 2024-03-07 LAB — URINALYSIS, ROUTINE W REFLEX MICROSCOPIC
Bilirubin, UA: NEGATIVE
Glucose, UA: NEGATIVE
Ketones, UA: NEGATIVE
Leukocytes,UA: NEGATIVE
Nitrite, UA: NEGATIVE
Protein,UA: NEGATIVE
RBC, UA: NEGATIVE
Specific Gravity, UA: <=1.005 — AB (ref 1.005–1.030)
Urobilinogen, Ur: 0.2 mg/dL (ref 0.2–1.0)
pH, UA: 6 (ref 5.0–7.5)

## 2024-03-07 NOTE — Progress Notes (Unsigned)
 Subjective:  Patient ID: Matthew [name], male    DOB: 1950/09/13  Age: 73 y.o. MRN: [id number]  CC: Urinary Frequency (Urinary frequency but no pain. )   HPI  Discussed the use of AI scribe software for clinical note transcription with the patient, who gave verbal consent to proceed.  History of Present Illness Matthew [name] is a 73 year old male who presents with urinary frequency and memory concerns.  He experiences frequent urination, which he attributes to his high water intake. He is not currently taking tamsulosin , which was prescribed to help with urine flow, as he misplaced the medication.  He has concerns about his memory, stating that it is not as good as it used to be and he struggles to recall certain things. He mentions an incident where he took a medication starting with 'P', likely pregabalin , which caused significant disorientation for two and a half days.  His heartburn is well-controlled with his current medications.  No heartburn. Confirms frequent urination and memory issues.          03/07/2024    3:29 PM 02/07/2024    2:39 PM 01/16/2024    4:03 PM  Depression screen PHQ 2/9  Decreased Interest 0 0 0  Down, Depressed, Hopeless 0 0 0  PHQ - 2 Score 0 0 0  Altered sleeping 0 0 1  Tired, decreased energy 0 0 0  Change in appetite 0 0 0  Feeling bad or failure about yourself  0 0 0  Trouble concentrating 0 0 0  Moving slowly or fidgety/restless 0 0 0  Suicidal thoughts 0 0 0  PHQ-9 Score 0 0 1   Difficult doing work/chores Not difficult at all Not difficult at all Not difficult at all     Data saved with a previous flowsheet row definition    History Matthew [name] has a past medical history of Agoraphobia, Anemia, Anxiety, Arthritis, Asthma, Barrett esophagus, Blood transfusion without reported diagnosis, Bronchitis, Chronic kidney disease, Complication of anesthesia, Educated about COVID-19 virus infection (12/02/2019), Elevated serum creatinine,  Esophageal stricture, GERD (gastroesophageal reflux disease), Hypertension, Mood disorder, Neck pain, chronic, Neuromuscular disorder (HCC), Palpitations (12/02/2019), Rosacea, and Vitamin B12 deficiency.   He has a past surgical history that includes Chest tube insertion; Fracture surgery; Wisdom tooth extraction; Lumbar laminectomy/decompression microdiscectomy (12/06/2011); Colonoscopy (08/20/2003); Upper gastrointestinal endoscopy (03/15/2013); and skin cancer removal from forehead.   His family history includes Diabetes in his mother; Heart disease in his mother; Stroke in his mother.He reports that he quit smoking about 39 years ago. His smoking use included cigarettes. He started smoking about 42 years ago. He has a 0.9 pack-year smoking history. He has never used smokeless tobacco. He reports that he does not drink alcohol and does not use drugs.    ROS Review of Systems  Constitutional: Negative.   HENT: Negative.    Eyes:  Negative for visual disturbance.  Respiratory:  Negative for cough and shortness of breath.   Cardiovascular:  Negative for chest pain and leg swelling.  Gastrointestinal:  Negative for abdominal pain, diarrhea, nausea and vomiting.  Genitourinary:  Negative for difficulty urinating.  Musculoskeletal:  Negative for arthralgias and myalgias.  Skin:  Negative for rash.  Neurological:  Negative for headaches.  Psychiatric/Behavioral:  Negative for sleep disturbance.     Objective:  BP (!) 124/59   Pulse 66   Temp (!) 97.5 F (36.4 C)   Ht 6' 4 (1.93 m)   Wt 165 lb (74.8  kg)   SpO2 97%   BMI 20.08 kg/m   BP Readings from Last 3 Encounters:  03/07/24 (!) 124/59  02/07/24 (!) 142/72  01/16/24 (!) 157/61    Wt Readings from Last 3 Encounters:  03/07/24 165 lb (74.8 kg)  02/07/24 165 lb (74.8 kg)  01/16/24 174 lb 3.2 oz (79 kg)     Physical Exam Vitals reviewed.  Constitutional:      Appearance: He is well-developed.  HENT:     Head:  Normocephalic and atraumatic.     Right Ear: External ear normal.     Left Ear: External ear normal.     Mouth/Throat:     Pharynx: No oropharyngeal exudate or posterior oropharyngeal erythema.  Eyes:     Pupils: Pupils are equal, round, and reactive to light.  Cardiovascular:     Rate and Rhythm: Normal rate and regular rhythm.     Heart sounds: No murmur heard. Pulmonary:     Effort: No respiratory distress.     Breath sounds: Normal breath sounds.  Musculoskeletal:     Cervical back: Normal range of motion and neck supple.  Neurological:     Mental Status: He is alert and oriented to person, place, and time.    Physical Exam GENERAL: Alert, cooperative, well developed, no acute distress HEENT: Normocephalic, normal oropharynx, moist mucous membranes CHEST: Clear to auscultation bilaterally, No wheezes, rhonchi, or crackles CARDIOVASCULAR: Normal heart rate and rhythm, S1 and S2 normal without murmurs ABDOMEN: Soft, non-tender, non-distended, without organomegaly, Normal bowel sounds EXTREMITIES: No cyanosis or edema NEUROLOGICAL: Cranial nerves grossly intact, Moves all extremities without gross motor or sensory deficit   Assessment & Plan:  Benign prostatic hyperplasia with urinary hesitancy  Urinary frequency -     Urinalysis, Routine w reflex microscopic -     Urine Culture  Schizoaffective disorder, unspecified type (HCC)  Essential hypertension    Assessment and Plan Assessment & Plan Benign prostatic hyperplasia with lower urinary tract symptoms   He reports frequent urination and difficulty with memory. He is not taking tamsulosin  due to concerns about drug interactions. He had a previous adverse reaction to pregabalin , which was not prescribed by this provider. Limit fluid intake after supper and avoid caffeine after lunch to manage symptoms. Encourage resumption of tamsulosin  for urinary flow and prostate health.       Follow-up: No follow-ups on  file.  Butler Der, M.D.

## 2024-03-08 LAB — URINE CULTURE: Organism ID, Bacteria: NO GROWTH

## 2024-03-09 ENCOUNTER — Encounter: Payer: Self-pay | Admitting: Family Medicine

## 2024-03-09 ENCOUNTER — Other Ambulatory Visit: Payer: Self-pay | Admitting: Psychiatry

## 2024-03-09 ENCOUNTER — Ambulatory Visit: Payer: Self-pay | Admitting: Family Medicine

## 2024-03-09 DIAGNOSIS — G2119 Other drug induced secondary parkinsonism: Secondary | ICD-10-CM

## 2024-03-09 DIAGNOSIS — F259 Schizoaffective disorder, unspecified: Secondary | ICD-10-CM

## 2024-03-09 NOTE — Progress Notes (Signed)
Hello Richard,  Your lab result is normal and/or stable.Some minor variations that are not significant are commonly marked abnormal, but do not represent any medical problem for you.  Best regards, Helon Wisinski, M.D.

## 2024-03-11 ENCOUNTER — Telehealth: Admitting: Psychiatry

## 2024-03-13 ENCOUNTER — Ambulatory Visit: Admitting: Family Medicine

## 2024-03-15 ENCOUNTER — Telehealth: Payer: Self-pay | Admitting: Family Medicine

## 2024-03-15 NOTE — Telephone Encounter (Signed)
 Patient called the office and spoke with the triage nurse, [place].   Copied from CRM #[id number]. Topic: Clinical - Medical Advice >> Mar 15, 2024  8:56 AM Cherylann RAMAN wrote: Reason for CRM: Patient called in with concerns regarding unspecified reason for weight loss. Patient states that he started out weighing 213 lbs but has declined over the years. Patient's current weight is 168 lbs. Patient states he eats 3 times daily. However, now he is about as skinny as a rail. Patient would like a callback from Dr. Butler Der or his nurse regarding his weight loss. Patient states he is scared of the weight loss.  Please contact patient at [phone].

## 2024-03-18 ENCOUNTER — Encounter: Payer: Self-pay | Admitting: Family Medicine

## 2024-03-18 ENCOUNTER — Ambulatory Visit (INDEPENDENT_AMBULATORY_CARE_PROVIDER_SITE_OTHER): Admitting: Family Medicine

## 2024-03-18 VITALS — BP 136/63 | HR 64 | Temp 98.2°F | Ht 76.0 in | Wt 169.0 lb

## 2024-03-18 DIAGNOSIS — M5416 Radiculopathy, lumbar region: Secondary | ICD-10-CM

## 2024-03-18 DIAGNOSIS — F259 Schizoaffective disorder, unspecified: Secondary | ICD-10-CM | POA: Diagnosis not present

## 2024-03-18 DIAGNOSIS — I10 Essential (primary) hypertension: Secondary | ICD-10-CM

## 2024-03-18 MED ORDER — BETAMETHASONE SOD PHOS & ACET 6 (3-3) MG/ML IJ SUSP
6.0000 mg | Freq: Once | INTRAMUSCULAR | Status: AC
  Administered 2024-03-18: 6 mg via INTRAMUSCULAR

## 2024-03-18 NOTE — Progress Notes (Signed)
 "  Subjective:  Patient ID: Matthew [name], male    DOB: 01-17-1951  Age: 73 y.o. MRN: [id number]  CC: Weight Loss (Ongoing for about a year. Pt reports he has a good appetite.  Not sure why he is losing it. )   HPI  Discussed the use of AI scribe software for clinical note transcription with the patient, who gave verbal consent to proceed.  History of Present Illness Matthew [name] is a 73 year old male who presents with concerns about weight gain and ongoing back pain due to a pinched nerve.  He has experienced a slight weight gain over the past year, increasing from 166 pounds in January 2025 to 169 pounds currently. Despite this change, he feels okay overall but is concerned about the weight gain.  He has a history of persistent back pain and reports being told he has a pinched nerve. Last month, he was prescribed pregabalin  to manage the nerve pain but experienced adverse effects, including imbalance and confusion, after taking just one tablet. These symptoms resolved over two and a half days as the medication wore off.  He has previously received steroid injections for his back pain, with the most recent cortisone shot administered approximately six weeks ago. He is uncertain about the effectiveness of the injection, as the pain persists in the lower back, specifically around the L5 area.   presents for  follow-up of hypertension. Patient has no history of headache chest pain or shortness of breath or recent cough. Patient also denies symptoms of TIA such as focal numbness or weakness. Patient denies side effects from medication. States taking it regularly.           03/18/2024    2:09 PM 03/07/2024    3:29 PM 02/07/2024    2:39 PM  Depression screen PHQ 2/9  Decreased Interest 0 0 0  Down, Depressed, Hopeless 0 0 0  PHQ - 2 Score 0 0 0  Altered sleeping 0 0 0  Tired, decreased energy 0 0 0  Change in appetite 0 0 0  Feeling bad or failure about yourself  0 0 0  Trouble  concentrating 0 0 0  Moving slowly or fidgety/restless 0 0 0  Suicidal thoughts 0 0 0  PHQ-9 Score 0 0 0  Difficult doing work/chores Not difficult at all Not difficult at all Not difficult at all    History Froilan has a past medical history of Agoraphobia, Anemia, Anxiety, Arthritis, Asthma, Barrett esophagus, Blood transfusion without reported diagnosis, Bronchitis, Chronic kidney disease, Complication of anesthesia, Educated about COVID-19 virus infection (12/02/2019), Elevated serum creatinine, Esophageal stricture, GERD (gastroesophageal reflux disease), Hypertension, Mood disorder, Neck pain, chronic, Neuromuscular disorder (HCC), Palpitations (12/02/2019), Rosacea, and Vitamin B12 deficiency.   He has a past surgical history that includes Chest tube insertion; Fracture surgery; Wisdom tooth extraction; Lumbar laminectomy/decompression microdiscectomy (12/06/2011); Colonoscopy (08/20/2003); Upper gastrointestinal endoscopy (03/15/2013); and skin cancer removal from forehead.   His family history includes Diabetes in his mother; Heart disease in his mother; Stroke in his mother.He reports that he quit smoking about 39 years ago. His smoking use included cigarettes. He started smoking about 42 years ago. He has a 0.9 pack-year smoking history. He has never used smokeless tobacco. He reports that he does not drink alcohol and does not use drugs.    ROS Review of Systems  Constitutional: Negative.   HENT: Negative.    Eyes:  Negative for visual disturbance.  Respiratory:  Negative for cough  and shortness of breath.   Cardiovascular:  Negative for chest pain and leg swelling.  Gastrointestinal:  Negative for abdominal pain, diarrhea, nausea and vomiting.  Genitourinary:  Negative for difficulty urinating.  Musculoskeletal:  Negative for arthralgias and myalgias.  Skin:  Negative for rash.  Neurological:  Negative for headaches.  Psychiatric/Behavioral:  Negative for sleep disturbance.      Objective:  BP 136/63   Pulse 64   Temp 98.2 F (36.8 C)   Ht 6' 4 (1.93 m)   Wt 169 lb (76.7 kg)   SpO2 99%   BMI 20.57 kg/m   BP Readings from Last 3 Encounters:  03/18/24 136/63  03/07/24 (!) 124/59  02/07/24 (!) 142/72    Wt Readings from Last 3 Encounters:  03/18/24 169 lb (76.7 kg)  03/07/24 165 lb (74.8 kg)  02/07/24 165 lb (74.8 kg)     Physical Exam Vitals reviewed.  Constitutional:      Appearance: He is well-developed.  HENT:     Head: Normocephalic and atraumatic.     Right Ear: External ear normal.     Left Ear: External ear normal.     Mouth/Throat:     Pharynx: No oropharyngeal exudate or posterior oropharyngeal erythema.  Eyes:     Pupils: Pupils are equal, round, and reactive to light.  Cardiovascular:     Rate and Rhythm: Normal rate and regular rhythm.     Heart sounds: No murmur heard. Pulmonary:     Effort: No respiratory distress.     Breath sounds: Normal breath sounds.  Musculoskeletal:     Cervical back: Normal range of motion and neck supple.  Neurological:     Mental Status: He is alert and oriented to person, place, and time.    Physical Exam MEASUREMENTS: Weight- 169. GENERAL: Alert, cooperative, well developed, no acute distress HEENT: Normocephalic, normal oropharynx, moist mucous membranes CHEST: Clear to auscultation bilaterally, No wheezes, rhonchi, or crackles CARDIOVASCULAR: Normal heart rate and rhythm, S1 and S2 normal without murmurs ABDOMEN: Soft, non-tender, non-distended, without organomegaly, Normal bowel sounds EXTREMITIES: No cyanosis or edema NEUROLOGICAL: Cranial nerves grossly intact, Moves all extremities without gross motor or sensory deficit   Assessment & Plan:  Schizoaffective disorder, unspecified type (HCC)  Lumbar radiculopathy -     Betamethasone  Sod Phos & Acet  Essential hypertension    Assessment and Plan Assessment & Plan Lumbar radiculopathy   Chronic lumbar radiculopathy with  persistent lower back pain at the L5 area. Previous cortisone injection provided temporary relief. Pregabalin  was not tolerated due to imbalance and confusion. Surgical intervention was considered but not pursued due to medication interactions and his preference. Alternative pain management includes referral to a pain clinic for potential opiate therapy, though he has not yet pursued this option. Administered cortisone injection. Will consider referral to a pain clinic for further management, including potential opiate therapy.       Follow-up: Return in about 6 weeks (around 04/29/2024) for [facility] physical.  Butler Der, M.D. "

## 2024-03-29 ENCOUNTER — Ambulatory Visit: Admitting: *Deleted

## 2024-03-29 DIAGNOSIS — E538 Deficiency of other specified B group vitamins: Secondary | ICD-10-CM

## 2024-03-29 NOTE — Progress Notes (Signed)
 Patient is in office today for a nurse visit for B12 Injection. Patient Injection was given in the  Right deltoid. Patient tolerated injection well.

## 2024-04-04 ENCOUNTER — Other Ambulatory Visit: Payer: Self-pay | Admitting: Psychiatry

## 2024-04-04 DIAGNOSIS — F259 Schizoaffective disorder, unspecified: Secondary | ICD-10-CM

## 2024-04-05 ENCOUNTER — Other Ambulatory Visit: Payer: Self-pay | Admitting: Psychiatry

## 2024-04-05 DIAGNOSIS — F4001 Agoraphobia with panic disorder: Secondary | ICD-10-CM

## 2024-04-16 ENCOUNTER — Telehealth: Payer: Self-pay | Admitting: Family Medicine

## 2024-04-16 ENCOUNTER — Ambulatory Visit: Payer: Self-pay

## 2024-04-16 NOTE — Telephone Encounter (Signed)
 FYI Only or Action Required?: Action required by provider: please see triage notes.  Patient was last seen in primary care on 03/18/2024 by Zollie Lowers, MD.  Called Nurse Triage reporting Altered Mental Status and Medication Problem. Pt is unsure if he took 0,1,or 2 Pepcid  last night Symptoms began yesterday.  Interventions attempted: Nothing.  Symptoms are: unsure.  Triage Disposition: See PCP Within 2 Weeks  Patient/caregiver understands and will follow disposition?: Unsure                   Summary: Confusion and possible overdose   Reason for Triage: Bad confusion and the fact he doesn't know what medicine he took last night or even how much if at all     Reason for Disposition  [1] Longstanding confusion (e.g., dementia, stroke) AND [2] NO worsening or change  Answer Assessment - Initial Assessment Questions Pt called because he was unsure if he took 0,1 or 2 pepcid  last night. He seemed a bit out of it. I asked him if this was his normal, he said he just woke up. He gave a lot of background information about providers and medications. He notes that he takes medication several times a day and is having some issues keeping them straight. Pt might benefit from packaged dosing.  Pt repeated himself several times.  Unsure if this is his normal  - please advise.          1. LEVEL OF CONSCIOUSNESS: How are they (the patient) acting right now? (e.g., alert-oriented, confused, lethargic, stuporous, comatose)     Seems a bit off. When I asked pt about being a bit confused, He stated he just woke up. He seem to get better as the conversation went on, excpet he kept repeating himself. 2. ONSET: When did the confusion start?  (e.g., minutes, hours, days)     Unsure 3. PATTERN: Does this come and go, or has it been constant since it started?  Is it present now?     unsure 4. ALCOHOL or DRUGS: Have they been drinking alcohol or taking any drugs?       Takes rx medications 5. NARCOTIC MEDICINES: Have they been receiving any narcotic medications? (e.g., morphine , Vicodin)     yes 6. CAUSE: What do you think is causing the confusion?      Unsure 7. OTHER SYMPTOMS: Are there any other symptoms? (e.g., difficulty breathing, fever, headache, weakness)     Pt is unsure if he took 0,1 or 2 pepcid  last night.  Protocols used: Confusion - Delirium-A-AH

## 2024-04-16 NOTE — Telephone Encounter (Unsigned)
 Copied from CRM [id number]. Topic: Clinical - Red Word Triage >> Apr 16, 2024  9:04 AM Selinda RAMAN wrote: Red Word that prompted transfer to Nurse Triage: Bad confusion and the fact he doesn't know what medicine he took last night or even how much if at all. I will transfer him to E2C2 NT  Please warm transfer Red Word call to Nurse Triage and then click Send Message and Close CRM. >> Apr 16, 2024  9:12 AM Selinda RAMAN wrote: Reason for Triage:  Bad confusion and the fact he doesn't know what medicine he took last night or even how much if at all

## 2024-04-16 NOTE — Telephone Encounter (Signed)
 Spoke to pt and tried to give clarification. LS

## 2024-04-16 NOTE — Telephone Encounter (Signed)
 If having mental status change persists he should go to E.D. However, taking 0-2 Famotidine  will not cause that problem

## 2024-04-17 ENCOUNTER — Ambulatory Visit: Payer: Self-pay

## 2024-04-17 NOTE — Telephone Encounter (Signed)
 Pt appears reports took vitamin pills today.

## 2024-04-17 NOTE — Telephone Encounter (Signed)
 FYI Only or Action Required?: FYI only for provider: ED advised.  Patient was last seen in primary care on 03/18/2024 by Zollie Lowers, MD.  Called Nurse Triage reporting Altered Mental Status.  Symptoms began yesterday.  Interventions attempted: Nothing.  Symptoms are: unchanged.  Triage Disposition: Call EMS 911 Now  Patient/caregiver understands and will follow disposition?:  Reason for Disposition  [1] Difficult to awaken or acting confused (e.g., disoriented, slurred speech) AND [2] present now AND [3] new-onset    Patient confused, home alone  Answer Assessment - Initial Assessment Questions Advised 911, patient okay with call. EMS arrived  1. LEVEL OF CONSCIOUSNESS: How are they (the patient) acting right now? (e.g., alert-oriented, confused, lethargic, stuporous, comatose)     Patient alert orient name, confused reports took vitamin D  today and made him feel different. Patient continually repeats, appears confused 2. ONSET: When did the confusion start?  (e.g., minutes, hours, days)     Yesterday, per notes, patient called stating took medication yesterday; MD reports need to go to ED if still confused.  SYMPTOMS: Are there any other symptoms? (e.g., difficulty breathing, fever, headache, weakness)     Denies diff breathing, chest pain, dizziness, HA, weakness/numbness one side of body, changes vision.  Pt appears reports took vitamin pills today, 1 pill, D3 high potent Nature's Truth 2000 units  Patient is alone.  Protocols used: Confusion - Delirium-A-AH

## 2024-04-17 NOTE — Telephone Encounter (Signed)
Noted  -LS

## 2024-04-18 ENCOUNTER — Ambulatory Visit: Payer: Self-pay

## 2024-04-18 NOTE — Telephone Encounter (Signed)
 Copied from CRM #[id number]. Topic: Clinical - Medication Question >> Apr 17, 2024  4:58 PM [name] wrote: Reason for CRM: Patient called. States EMS came to check him out - did EKG - said his heart was ok but he may need medication adjustment. He said it may be the Vitamin D3 prescribed by kidney. Would like a call back to see if he should still take it. >> Apr 18, 2024  9:01 AM [name] wrote: Patient called back. Would like to discuss Vitamin D3 2000 iu 50 mg - took all of them - got new name brand yesterday. Patient says he feels fine now. But wants to know about taking vitamins.

## 2024-04-18 NOTE — Telephone Encounter (Signed)
 Patient calling in to check if Dr Zollie RN has called him back because he fell asleep. Advised I have not seen any notes stating they have.   Has questions about which VitD3 supplement would be best to not get stuck in his throat/pit of his stomach. Denies complications swallowing but that just can feel the pill.   Also has a call to the Pharmacist who advised him to come down to discuss the pills. Patient called back and Pharmacist he spoke with was gone for the day- advised Pharmacist is best for recommendations.   Patient would still like call back from Dr Zollie RN

## 2024-04-18 NOTE — Telephone Encounter (Signed)
 Patient spoke to pharmacist. LS

## 2024-04-18 NOTE — Telephone Encounter (Signed)
 Already discussed. LS

## 2024-04-18 NOTE — Telephone Encounter (Signed)
 Yes, please.

## 2024-04-18 NOTE — Telephone Encounter (Signed)
 Pt called reporting he took D3 this morning and that he can still feel it in the pit of [his] stomach. Pt alert and oriented, though it is noted in his chart that EMS was called yesterday to assess him for AMS. The pt did seem to require a great amount of  redirection to complete NT. Pt denies SOB/CP and pain of any kind. Please call the pt back to answer any further questions he has about vit D3. It was difficult to ascertain what exactly he was asking about.   FYI Only or Action Required?: Action required by provider: pt has a question about D3.  Patient was last seen in primary care on 03/18/2024 by Zollie Lowers, MD.  Called Nurse Triage reporting GI Problem.  Symptoms began today.  Interventions attempted: Nothing.  Symptoms are: stable.  Triage Disposition: Home Care  Patient/caregiver understands and will follow disposition?: Yes    Reason for Triage: Feels like his medication is stuck in his stomach. Follow up from med reaction yesterday. Slurred speech + possible altered mental status.     Reason for Disposition  [1] MILD-MODERATE pain AND [2] constant and [3] present < 2 hours    No pain  Answer Assessment - Initial Assessment Questions 1. LOCATION: Where does it hurt?      Feels like it's stuck in the pit of my stomach, referencing the D3 he took this morning. Denies pain.   2. RADIATION: Does the pain shoot anywhere else? (e.g., chest, back)     Pt denies pain.   3. ONSET: When did the pain begin? (Minutes, hours or days ago)      Denies pain.   10. OTHER SYMPTOMS: Do you have any other symptoms? (e.g., back pain, diarrhea, fever, urination pain, vomiting)       Denies  Protocols used: Abdominal Pain - Male-A-AH

## 2024-04-25 ENCOUNTER — Encounter: Payer: Self-pay | Admitting: Psychiatry

## 2024-04-25 ENCOUNTER — Ambulatory Visit: Admitting: Psychiatry

## 2024-04-25 DIAGNOSIS — F4001 Agoraphobia with panic disorder: Secondary | ICD-10-CM

## 2024-04-25 DIAGNOSIS — Z87820 Personal history of traumatic brain injury: Secondary | ICD-10-CM | POA: Diagnosis not present

## 2024-04-25 DIAGNOSIS — F411 Generalized anxiety disorder: Secondary | ICD-10-CM

## 2024-04-25 DIAGNOSIS — F259 Schizoaffective disorder, unspecified: Secondary | ICD-10-CM | POA: Diagnosis not present

## 2024-04-25 DIAGNOSIS — G3184 Mild cognitive impairment, so stated: Secondary | ICD-10-CM
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
## 2024-04-29 ENCOUNTER — Ambulatory Visit

## 2024-05-01 ENCOUNTER — Ambulatory Visit

## 2024-05-03 ENCOUNTER — Ambulatory Visit

## 2024-05-08 ENCOUNTER — Encounter: Payer: Self-pay | Admitting: Family Medicine

## 2024-05-14 ENCOUNTER — Ambulatory Visit

## 2024-08-22 ENCOUNTER — Telehealth: Admitting: Psychiatry
